# Patient Record
Sex: Female | Born: 1955 | Race: Black or African American | Hispanic: No | Marital: Single | Smoking: Never smoker
Health system: Southern US, Community
[De-identification: ages and names within clinical notes are randomized; demographics above are authoritative.]

## PROBLEM LIST (undated history)

## (undated) DIAGNOSIS — I10 Essential (primary) hypertension: Secondary | ICD-10-CM

## (undated) DIAGNOSIS — F29 Unspecified psychosis not due to a substance or known physiological condition: Secondary | ICD-10-CM

## (undated) DIAGNOSIS — F32A Depression, unspecified: Secondary | ICD-10-CM

## (undated) DIAGNOSIS — E785 Hyperlipidemia, unspecified: Secondary | ICD-10-CM

## (undated) DIAGNOSIS — G8929 Other chronic pain: Secondary | ICD-10-CM

## (undated) DIAGNOSIS — F329 Major depressive disorder, single episode, unspecified: Secondary | ICD-10-CM

## (undated) DIAGNOSIS — E079 Disorder of thyroid, unspecified: Secondary | ICD-10-CM

## (undated) DIAGNOSIS — B1920 Unspecified viral hepatitis C without hepatic coma: Secondary | ICD-10-CM

## (undated) DIAGNOSIS — G1221 Amyotrophic lateral sclerosis: Secondary | ICD-10-CM

## (undated) DIAGNOSIS — I509 Heart failure, unspecified: Secondary | ICD-10-CM

## (undated) DIAGNOSIS — F419 Anxiety disorder, unspecified: Secondary | ICD-10-CM

## (undated) DIAGNOSIS — E559 Vitamin D deficiency, unspecified: Secondary | ICD-10-CM

## (undated) DIAGNOSIS — K219 Gastro-esophageal reflux disease without esophagitis: Secondary | ICD-10-CM

## (undated) DIAGNOSIS — I739 Peripheral vascular disease, unspecified: Secondary | ICD-10-CM

## (undated) DIAGNOSIS — E119 Type 2 diabetes mellitus without complications: Secondary | ICD-10-CM

## (undated) DIAGNOSIS — R270 Ataxia, unspecified: Secondary | ICD-10-CM

## (undated) HISTORY — DX: Type 2 diabetes mellitus without complications: E11.9

## (undated) HISTORY — PX: SHOULDER SURGERY: SHX246

## (undated) HISTORY — DX: Vitamin D deficiency, unspecified: E55.9

## (undated) HISTORY — DX: Peripheral vascular disease, unspecified: I73.9

## (undated) HISTORY — DX: Heart failure, unspecified: I50.9

## (undated) HISTORY — DX: Gastro-esophageal reflux disease without esophagitis: K21.9

## (undated) HISTORY — PX: COLONOSCOPY: SHX174

---

## 2005-09-05 ENCOUNTER — Ambulatory Visit: Payer: Self-pay

## 2006-11-04 ENCOUNTER — Ambulatory Visit: Payer: Self-pay

## 2007-11-26 ENCOUNTER — Ambulatory Visit: Payer: Self-pay

## 2008-10-26 ENCOUNTER — Emergency Department: Payer: Self-pay | Admitting: Emergency Medicine

## 2008-11-28 ENCOUNTER — Ambulatory Visit: Payer: Self-pay

## 2009-11-30 ENCOUNTER — Ambulatory Visit: Payer: Self-pay

## 2010-09-04 ENCOUNTER — Emergency Department: Payer: Self-pay | Admitting: Emergency Medicine

## 2010-10-03 IMAGING — CT CT HEAD WITHOUT CONTRAST
2 series · 16 of 30 positions shown, 20 images · non-contrast
Comparison: none

REASON FOR EXAM: hematoma midline occipital
COMMENTS:

PROCEDURE:     CT  - CT HEAD WITHOUT CONTRAST  - October 26, 2008  [DATE]
RESULT:
HISTORY: Scalp hematoma.
COMPARISON STUDIES:   No recent.

[Series 2: without · axial · non-contrast · 0.41mm/px · z∈[-158,-34]mm · 13 of 31 slices shown, 17 images]
[im 3/31  brain]
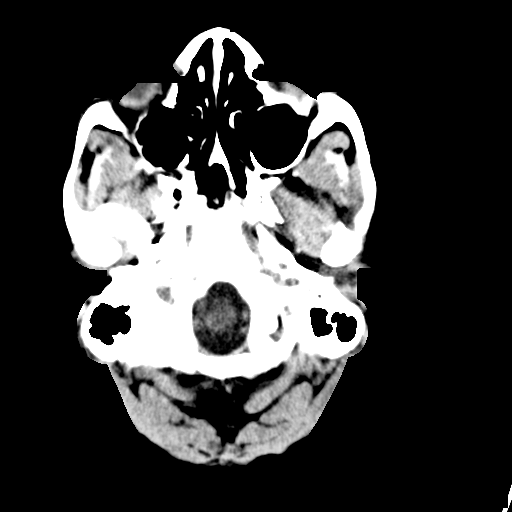
[im 3/31  bone]
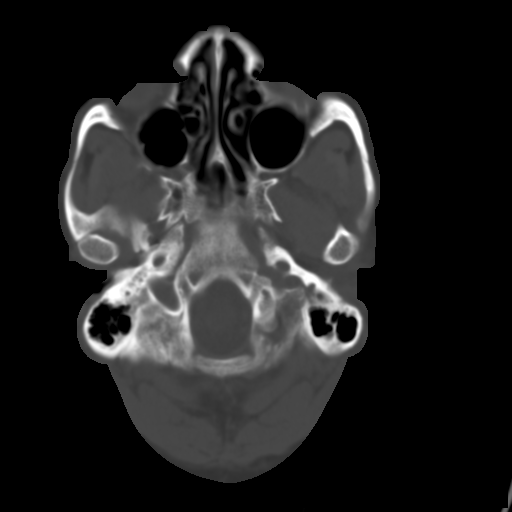
[im 5/31  brain]
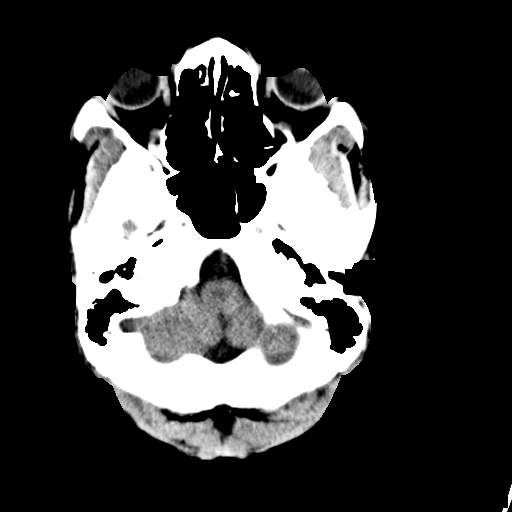
[im 7/31  brain]
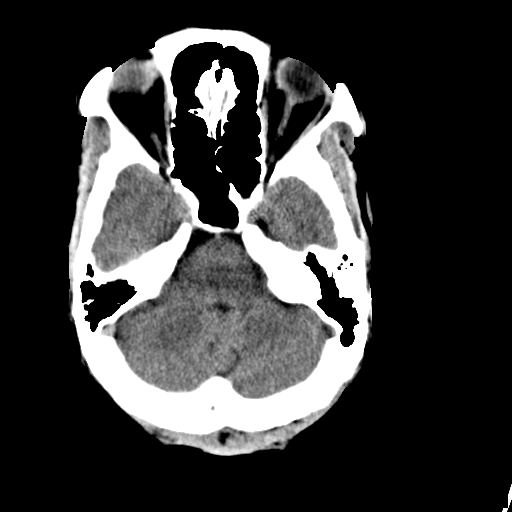
[im 9/31  brain]
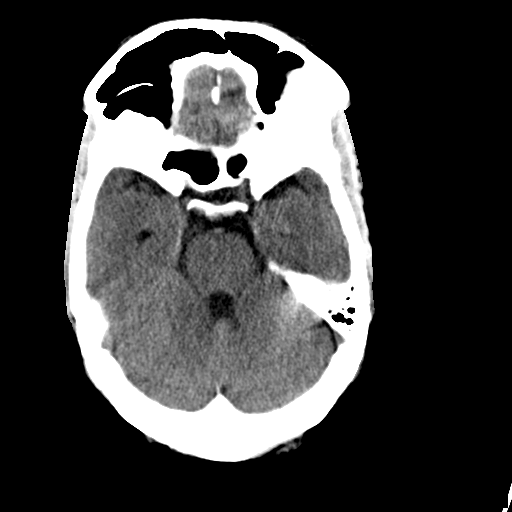
[im 11/31  brain]
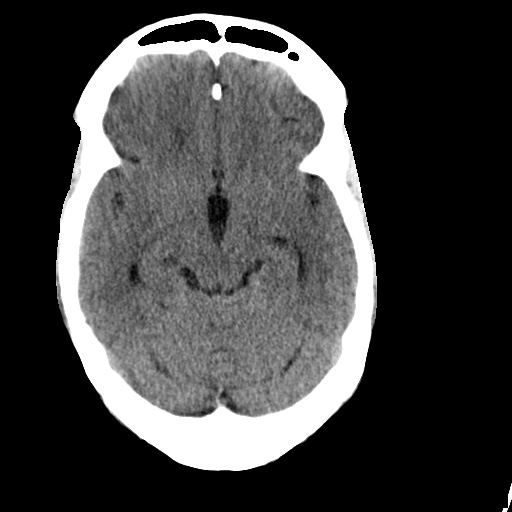
[im 11/31  bone]
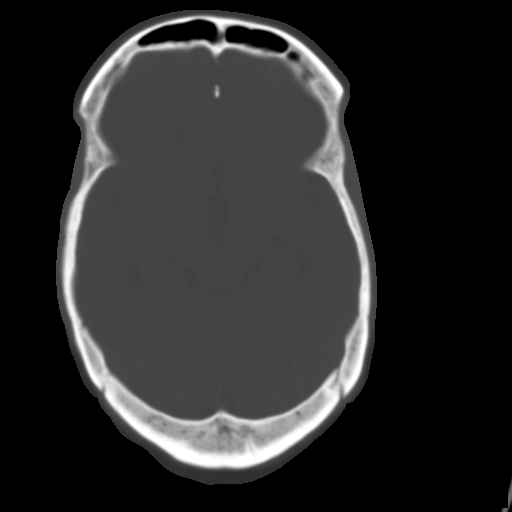
[im 13/31  brain]
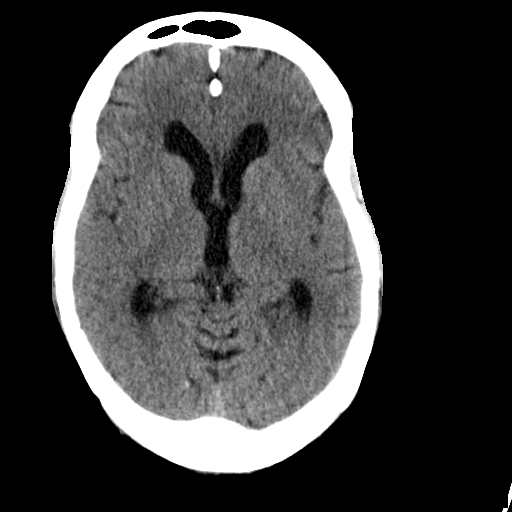
[im 16/31  brain]
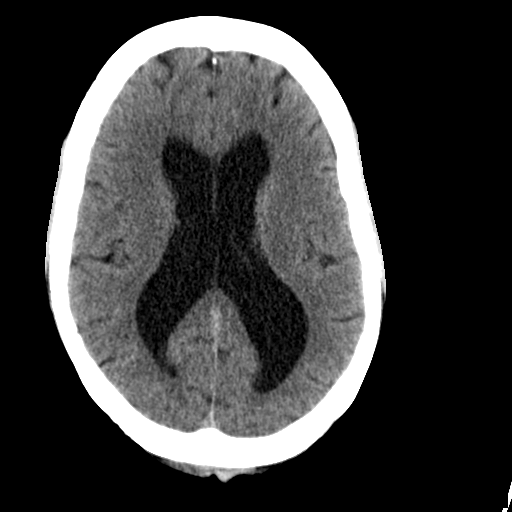
[im 18/31  brain]
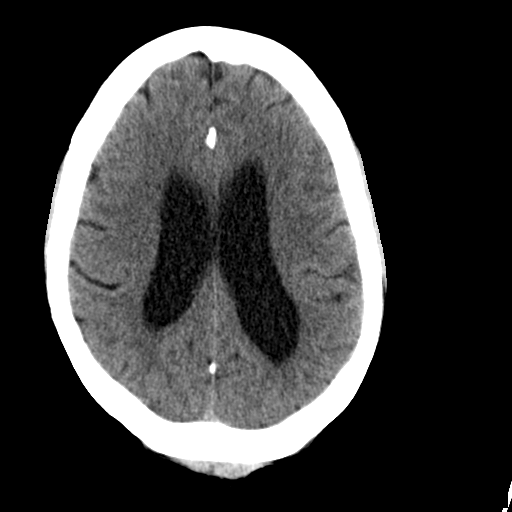
[im 20/31  brain]
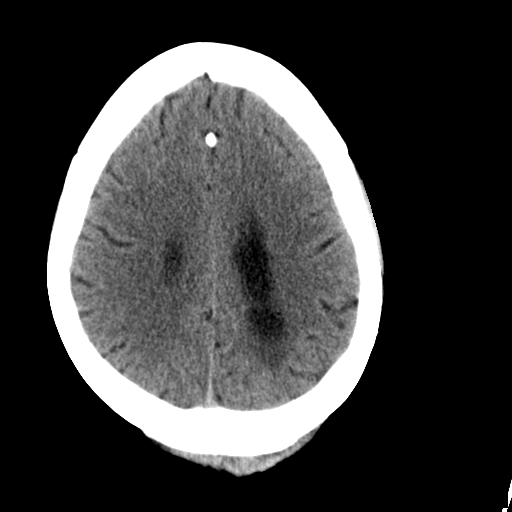
[im 20/31  bone]
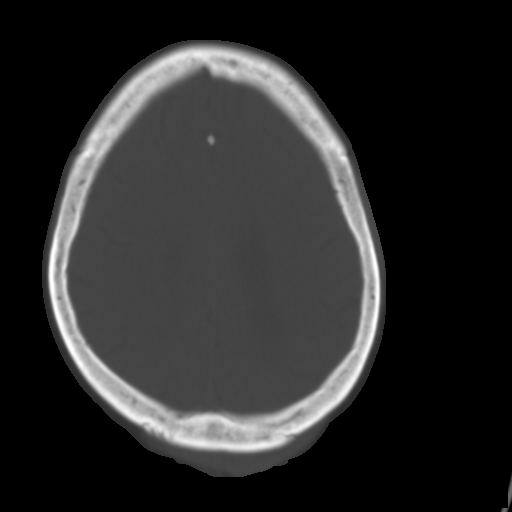
[im 22/31  brain]
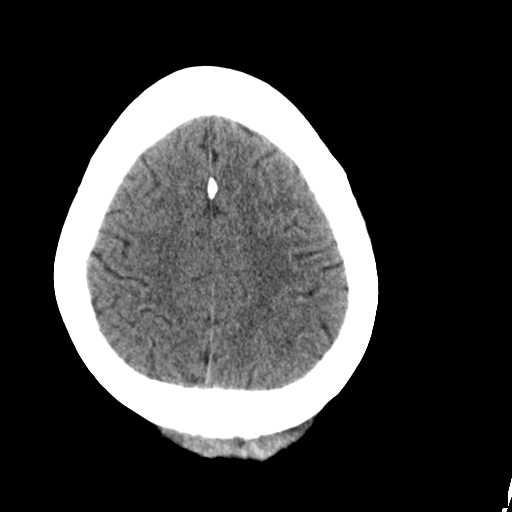
[im 24/31  brain]
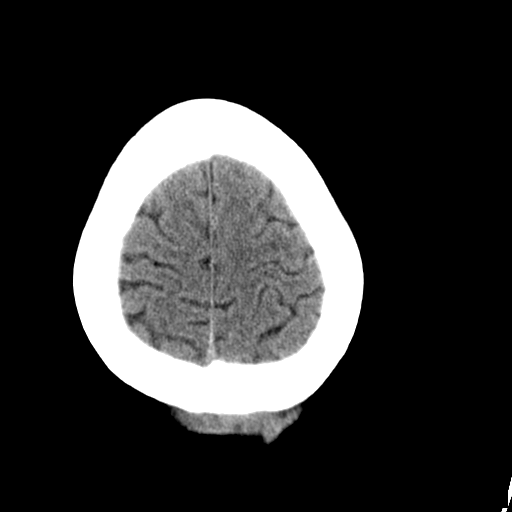
[im 26/31  brain]
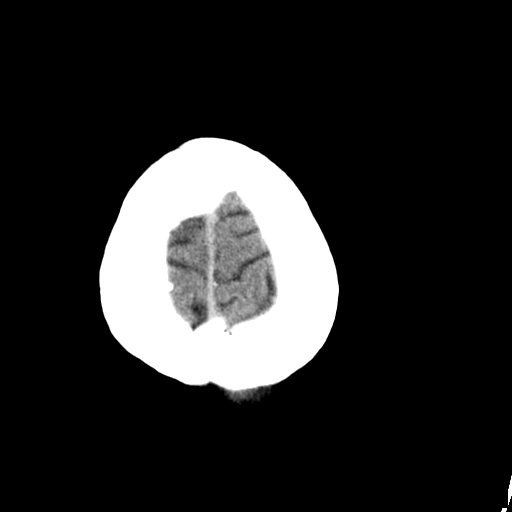
[im 28/31  brain]
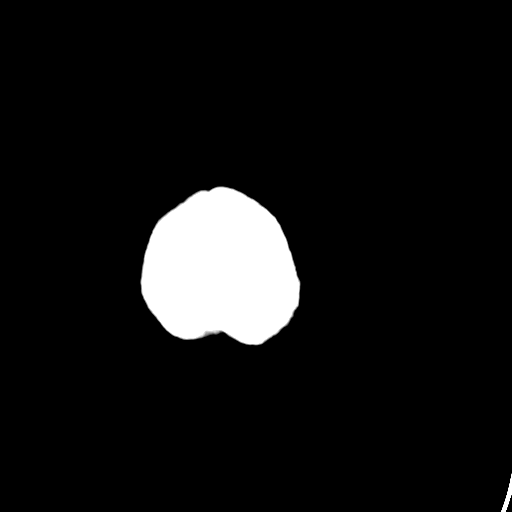
[im 28/31  bone]
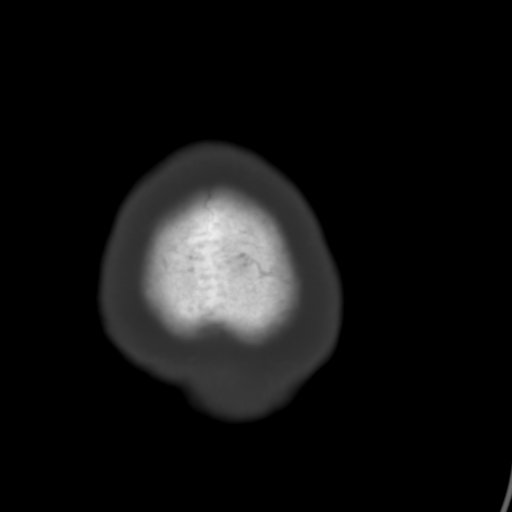

[Series 3: bone · axial · 0.41mm/px · z∈[-158,-118]mm · 3 of 31 slices shown]
[im 3/31  bone]
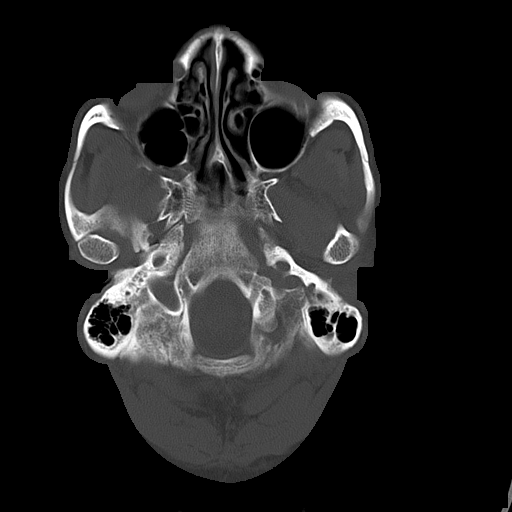
[im 7/31  bone]
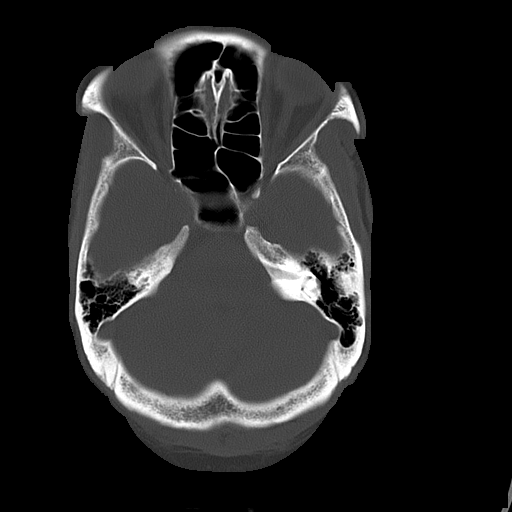
[im 11/31  bone]
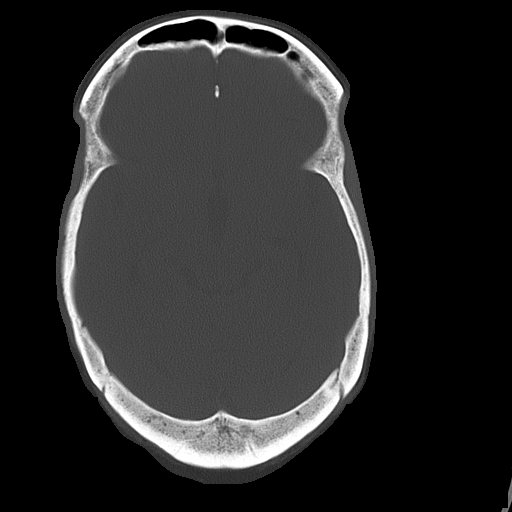

[16 of 30 positions shown; findings below may reference images not displayed]

FINDINGS: Soft tissue swelling is noted over the LEFT occipital region.  No
intraaxial or extraaxial pathologic fluid collections are identified. No
mass lesion is noted. There is no hydrocephalus. No bony abnormality is
identified.
IMPRESSION: 1.     Scalp hematoma LEFT occipital region.
2.     No intraaxial or extraaxial pathologic fluid or blood collection is
identified.
3.     Incidental note is made of mild periventricular changes suggesting
chronic white matter disease.

This report was phoned to the Emergency Room physician at the time of the
study.

## 2010-10-03 IMAGING — CR RIGHT ANKLE - COMPLETE 3+ VIEW
1 series · 6 of 6 positions shown · non-contrast
Comparison: none

REASON FOR EXAM: tender to palpation  of medial maleolus
COMMENTS:

PROCEDURE:     DXR - DXR ANKLE RIGHT COMPLETE  - October 26, 2008  [DATE]
RESULT:     Five views were obtained. No fracture, dislocation or other
acute bony abnormality is identified. The ankle mortise is well maintained.
There is a tiny plantar calcaneal spur.

[Series 1: view not recorded · 0.17mm/px · 6 of 6 slices shown]
[im 1/6]
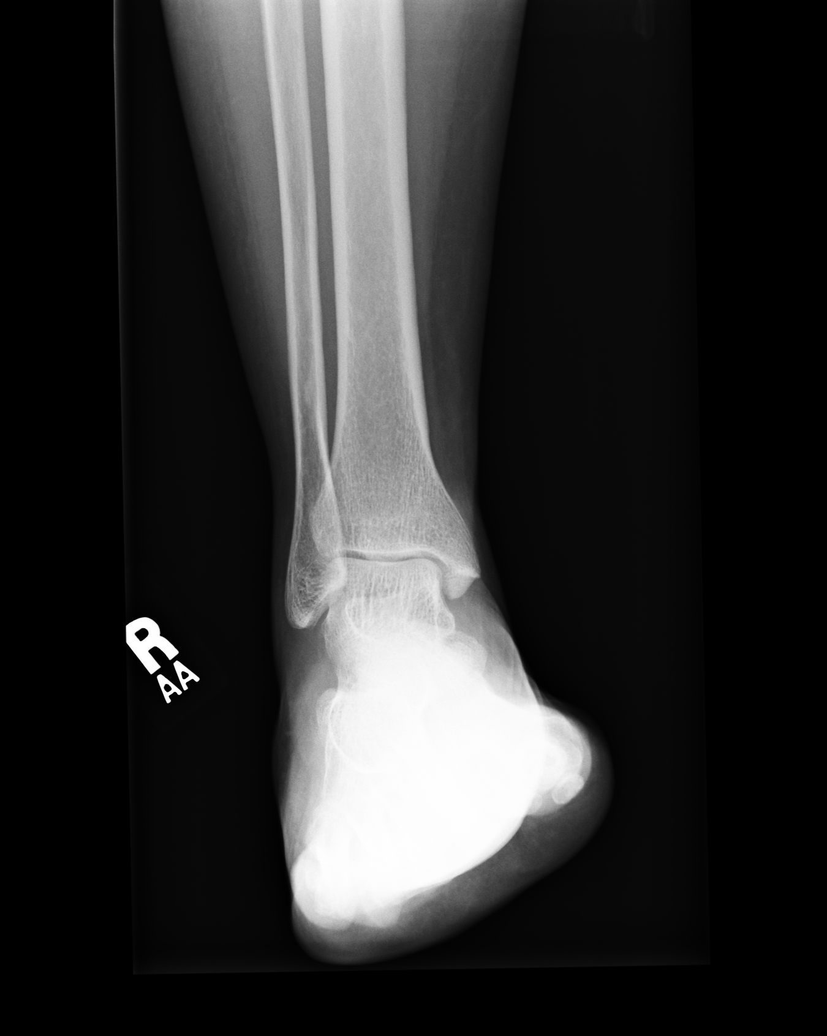
[im 2/6]
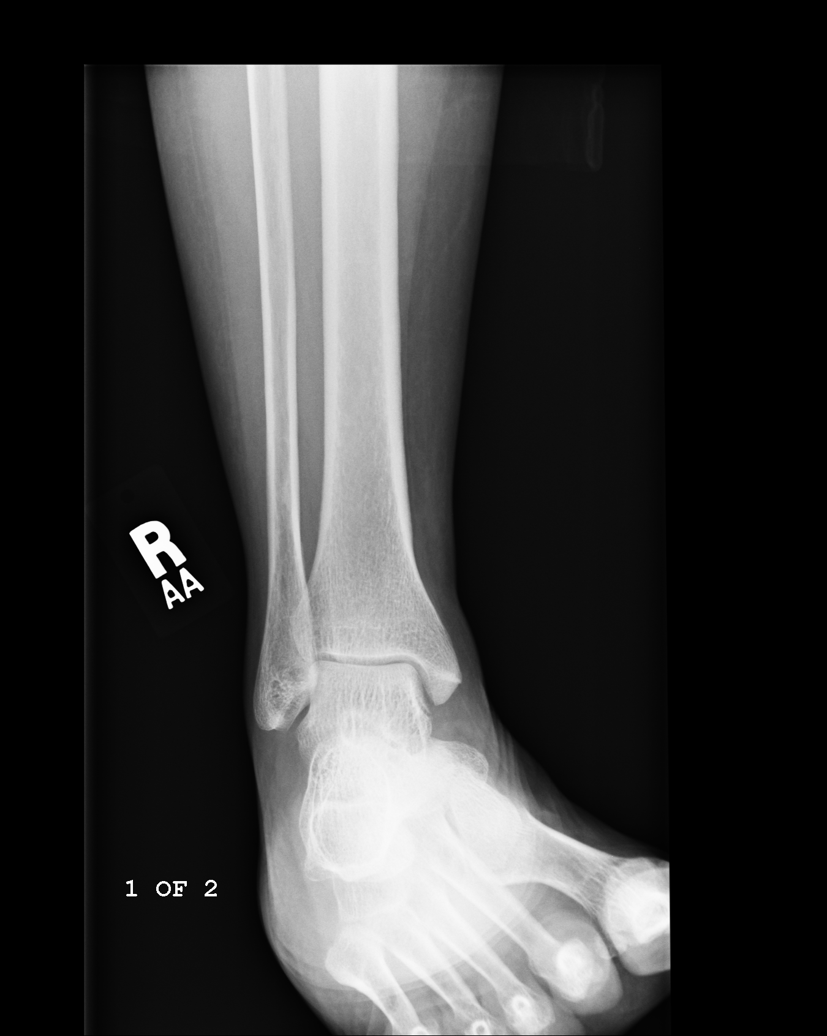
[im 3/6]
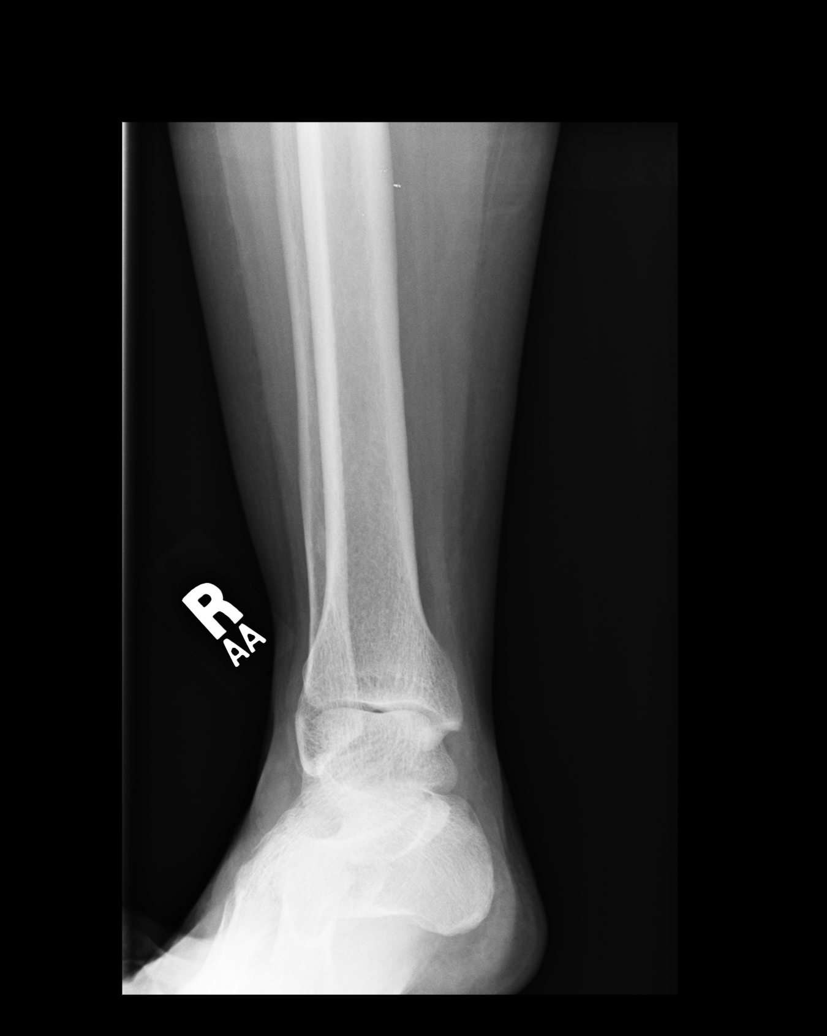
[im 4/6]
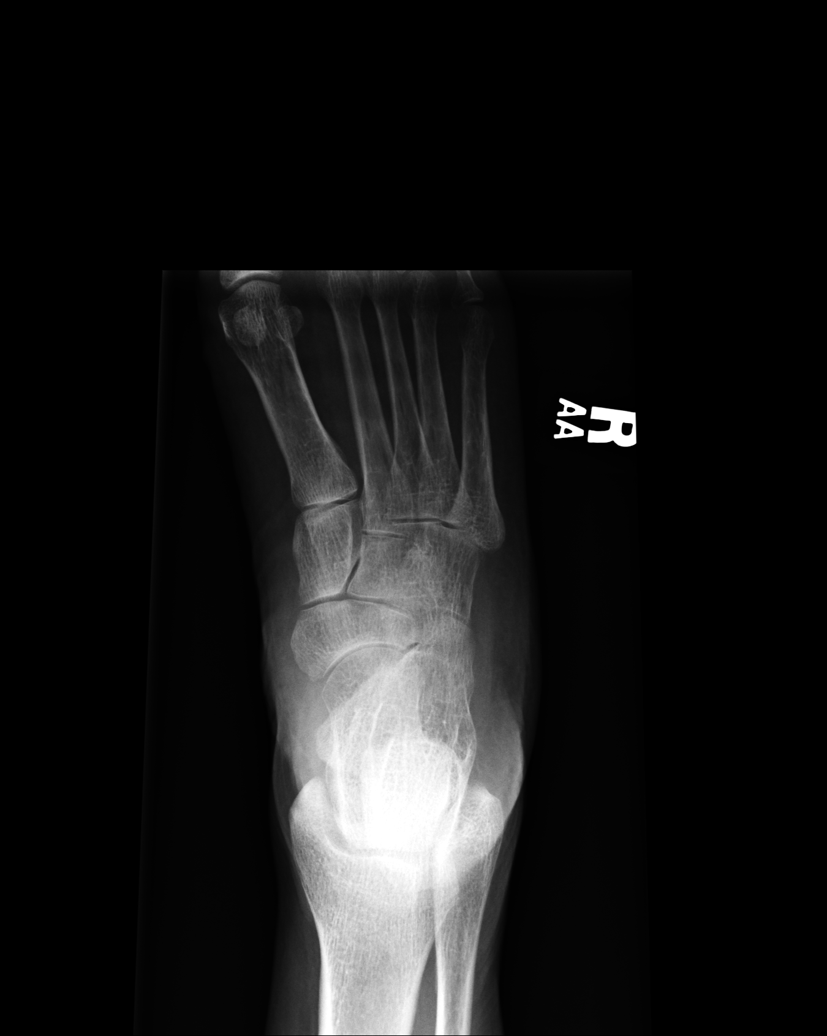
[im 5/6]
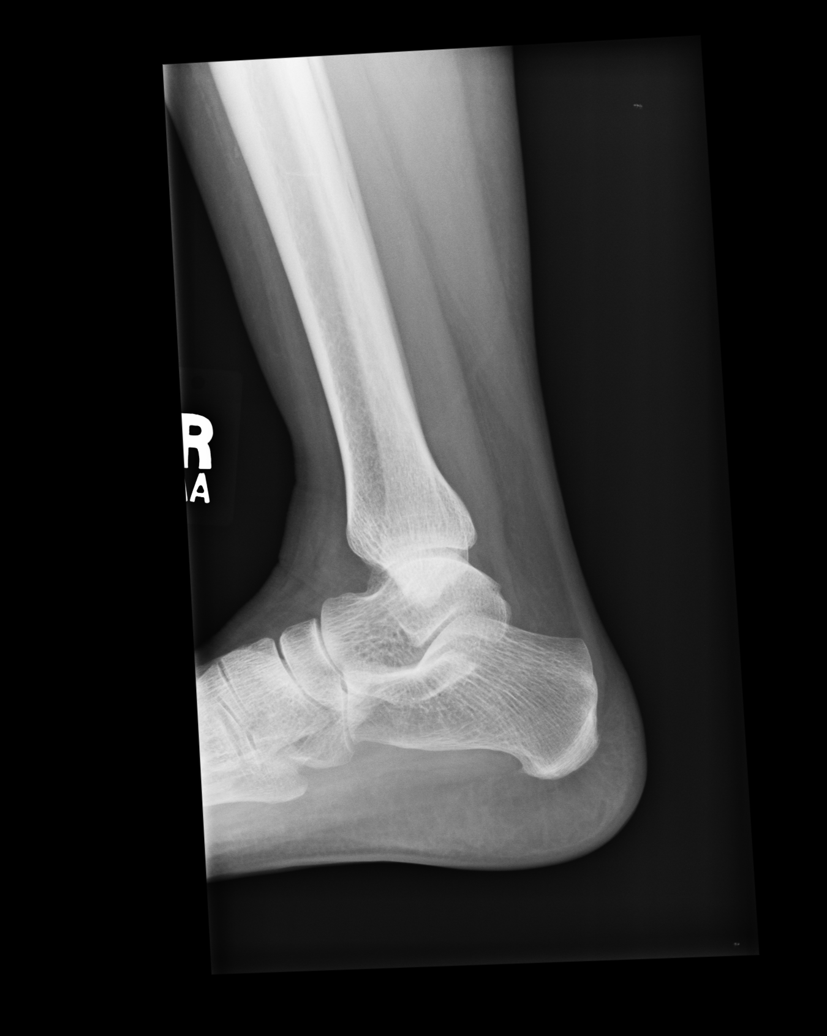
[im 6/6]
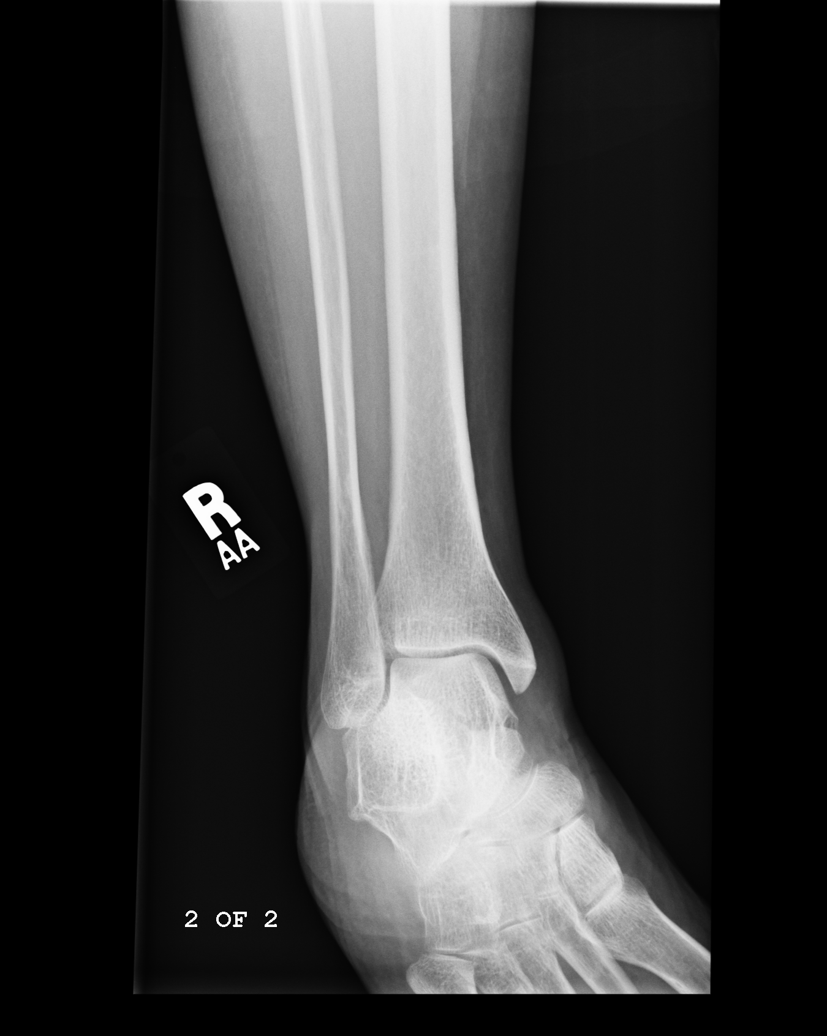

[6 of 6 positions shown; findings below may reference images not displayed]

IMPRESSION: 1. No acute bony abnormalities are identified.
2. There is a tiny plantar calcaneal spur.

## 2010-10-23 DIAGNOSIS — E039 Hypothyroidism, unspecified: Secondary | ICD-10-CM | POA: Diagnosis present

## 2010-12-12 ENCOUNTER — Ambulatory Visit: Payer: Self-pay

## 2011-02-27 DIAGNOSIS — I10 Essential (primary) hypertension: Secondary | ICD-10-CM | POA: Diagnosis present

## 2011-06-07 DIAGNOSIS — B182 Chronic viral hepatitis C: Secondary | ICD-10-CM | POA: Diagnosis present

## 2013-01-12 ENCOUNTER — Emergency Department: Payer: Self-pay | Admitting: Emergency Medicine

## 2013-01-12 LAB — COMPREHENSIVE METABOLIC PANEL
Alkaline Phosphatase: 84 U/L (ref 50–136)
Anion Gap: 13 (ref 7–16)
BUN: 5 mg/dL — ABNORMAL LOW (ref 7–18)
Bilirubin,Total: 1.1 mg/dL — ABNORMAL HIGH (ref 0.2–1.0)
Calcium, Total: 9.4 mg/dL (ref 8.5–10.1)
Co2: 27 mmol/L (ref 21–32)
Creatinine: 0.7 mg/dL (ref 0.60–1.30)
Glucose: 117 mg/dL — ABNORMAL HIGH (ref 65–99)
Osmolality: 267 (ref 275–301)
Sodium: 134 mmol/L — ABNORMAL LOW (ref 136–145)
Total Protein: 9.7 g/dL — ABNORMAL HIGH (ref 6.4–8.2)

## 2013-01-12 LAB — URINALYSIS, COMPLETE
Bilirubin,UR: NEGATIVE
Glucose,UR: NEGATIVE mg/dL (ref 0–75)
Hyaline Cast: 5
Nitrite: NEGATIVE
Ph: 7 (ref 4.5–8.0)
Protein: NEGATIVE
RBC,UR: 1 /HPF (ref 0–5)
Squamous Epithelial: 4
WBC UR: 2 /HPF (ref 0–5)

## 2013-01-12 LAB — TROPONIN I
Troponin-I: 0.04 ng/mL
Troponin-I: 0.03 ng/mL

## 2013-01-12 LAB — CK TOTAL AND CKMB (NOT AT ARMC)
CK-MB: 2 ng/mL (ref 0.5–3.6)
CK-MB: 1.2 ng/mL (ref 0.5–3.6)

## 2013-01-12 LAB — CBC
HGB: 14.2 g/dL (ref 12.0–16.0)
MCHC: 33 g/dL (ref 32.0–36.0)
MCV: 84 fL (ref 80–100)
RBC: 5.11 10*6/uL (ref 3.80–5.20)
RDW: 13.9 % (ref 11.5–14.5)
WBC: 7.9 10*3/uL (ref 3.6–11.0)

## 2013-01-12 LAB — TSH: Thyroid Stimulating Horm: 0.538 u[IU]/mL

## 2013-02-25 DIAGNOSIS — G1221 Amyotrophic lateral sclerosis: Secondary | ICD-10-CM | POA: Diagnosis present

## 2013-12-06 DIAGNOSIS — F32A Depression, unspecified: Secondary | ICD-10-CM | POA: Insufficient documentation

## 2015-11-11 ENCOUNTER — Inpatient Hospital Stay
Admission: EM | Admit: 2015-11-11 | Discharge: 2015-11-14 | DRG: 640 | Disposition: A | Discharge status: Skilled Nursing Facility | Payer: Medicare Other | Attending: Internal Medicine | Admitting: Internal Medicine

## 2015-11-11 ENCOUNTER — Emergency Department: Payer: Medicare Other

## 2015-11-11 ENCOUNTER — Encounter: Payer: Self-pay | Admitting: Emergency Medicine

## 2015-11-11 DIAGNOSIS — F329 Major depressive disorder, single episode, unspecified: Secondary | ICD-10-CM | POA: Diagnosis present

## 2015-11-11 DIAGNOSIS — Z7401 Bed confinement status: Secondary | ICD-10-CM | POA: Diagnosis not present

## 2015-11-11 DIAGNOSIS — T502X5A Adverse effect of carbonic-anhydrase inhibitors, benzothiadiazides and other diuretics, initial encounter: Secondary | ICD-10-CM | POA: Diagnosis present

## 2015-11-11 DIAGNOSIS — Z833 Family history of diabetes mellitus: Secondary | ICD-10-CM

## 2015-11-11 DIAGNOSIS — E860 Dehydration: Secondary | ICD-10-CM | POA: Diagnosis present

## 2015-11-11 DIAGNOSIS — E872 Acidosis, unspecified: Secondary | ICD-10-CM | POA: Diagnosis present

## 2015-11-11 DIAGNOSIS — G1221 Amyotrophic lateral sclerosis: Secondary | ICD-10-CM | POA: Diagnosis present

## 2015-11-11 DIAGNOSIS — E876 Hypokalemia: Secondary | ICD-10-CM | POA: Diagnosis present

## 2015-11-11 DIAGNOSIS — G9341 Metabolic encephalopathy: Secondary | ICD-10-CM | POA: Diagnosis present

## 2015-11-11 DIAGNOSIS — E878 Other disorders of electrolyte and fluid balance, not elsewhere classified: Secondary | ICD-10-CM | POA: Diagnosis present

## 2015-11-11 DIAGNOSIS — E874 Mixed disorder of acid-base balance: Principal | ICD-10-CM | POA: Diagnosis present

## 2015-11-11 DIAGNOSIS — I10 Essential (primary) hypertension: Secondary | ICD-10-CM | POA: Diagnosis present

## 2015-11-11 DIAGNOSIS — B1920 Unspecified viral hepatitis C without hepatic coma: Secondary | ICD-10-CM | POA: Diagnosis present

## 2015-11-11 DIAGNOSIS — T43215A Adverse effect of selective serotonin and norepinephrine reuptake inhibitors, initial encounter: Secondary | ICD-10-CM | POA: Diagnosis present

## 2015-11-11 DIAGNOSIS — A419 Sepsis, unspecified organism: Secondary | ICD-10-CM

## 2015-11-11 DIAGNOSIS — Z8249 Family history of ischemic heart disease and other diseases of the circulatory system: Secondary | ICD-10-CM | POA: Diagnosis not present

## 2015-11-11 DIAGNOSIS — Z79899 Other long term (current) drug therapy: Secondary | ICD-10-CM

## 2015-11-11 DIAGNOSIS — F419 Anxiety disorder, unspecified: Secondary | ICD-10-CM | POA: Diagnosis present

## 2015-11-11 DIAGNOSIS — Y92129 Unspecified place in nursing home as the place of occurrence of the external cause: Secondary | ICD-10-CM | POA: Diagnosis not present

## 2015-11-11 DIAGNOSIS — E039 Hypothyroidism, unspecified: Secondary | ICD-10-CM | POA: Diagnosis present

## 2015-11-11 DIAGNOSIS — Z823 Family history of stroke: Secondary | ICD-10-CM

## 2015-11-11 DIAGNOSIS — E871 Hypo-osmolality and hyponatremia: Secondary | ICD-10-CM | POA: Diagnosis present

## 2015-11-11 DIAGNOSIS — T501X5A Adverse effect of loop [high-ceiling] diuretics, initial encounter: Secondary | ICD-10-CM | POA: Diagnosis present

## 2015-11-11 DIAGNOSIS — R4182 Altered mental status, unspecified: Secondary | ICD-10-CM

## 2015-11-11 DIAGNOSIS — Z79891 Long term (current) use of opiate analgesic: Secondary | ICD-10-CM | POA: Diagnosis not present

## 2015-11-11 HISTORY — DX: Major depressive disorder, single episode, unspecified: F32.9

## 2015-11-11 HISTORY — DX: Essential (primary) hypertension: I10

## 2015-11-11 HISTORY — DX: Disorder of thyroid, unspecified: E07.9

## 2015-11-11 HISTORY — DX: Unspecified viral hepatitis C without hepatic coma: B19.20

## 2015-11-11 HISTORY — DX: Anxiety disorder, unspecified: F41.9

## 2015-11-11 HISTORY — DX: Amyotrophic lateral sclerosis: G12.21

## 2015-11-11 HISTORY — DX: Depression, unspecified: F32.A

## 2015-11-11 LAB — URINALYSIS COMPLETE WITH MICROSCOPIC (ARMC ONLY)
BACTERIA UA: NONE SEEN
Bilirubin Urine: NEGATIVE
Glucose, UA: NEGATIVE mg/dL
Ketones, ur: NEGATIVE mg/dL
Leukocytes, UA: NEGATIVE
Nitrite: NEGATIVE
PROTEIN: NEGATIVE mg/dL
Specific Gravity, Urine: 1.009 (ref 1.005–1.030)
pH: 6 (ref 5.0–8.0)

## 2015-11-11 LAB — CBC WITH DIFFERENTIAL/PLATELET
Basophils Absolute: 0.1 10*3/uL (ref 0–0.1)
Basophils Relative: 1 %
EOS ABS: 0.1 10*3/uL (ref 0–0.7)
Eosinophils Relative: 1 %
HEMATOCRIT: 42.6 % (ref 35.0–47.0)
HEMOGLOBIN: 14.1 g/dL (ref 12.0–16.0)
LYMPHS ABS: 2.3 10*3/uL (ref 1.0–3.6)
Lymphocytes Relative: 12 %
MCH: 26.9 pg (ref 26.0–34.0)
MCHC: 33.1 g/dL (ref 32.0–36.0)
MCV: 81.1 fL (ref 80.0–100.0)
Monocytes Absolute: 1.4 10*3/uL — ABNORMAL HIGH (ref 0.2–0.9)
Neutro Abs: 15.7 10*3/uL — ABNORMAL HIGH (ref 1.4–6.5)
Platelets: 233 10*3/uL (ref 150–440)
RBC: 5.26 MIL/uL — ABNORMAL HIGH (ref 3.80–5.20)
RDW: 13.4 % (ref 11.5–14.5)
WBC: 19.7 10*3/uL — ABNORMAL HIGH (ref 3.6–11.0)

## 2015-11-11 LAB — COMPREHENSIVE METABOLIC PANEL
ALBUMIN: 3.9 g/dL (ref 3.5–5.0)
ALT: 93 U/L — AB (ref 14–54)
AST: 104 U/L — AB (ref 15–41)
Alkaline Phosphatase: 100 U/L (ref 38–126)
BUN: 30 mg/dL — AB (ref 6–20)
CO2: 47 mmol/L — AB (ref 22–32)
CREATININE: 0.57 mg/dL (ref 0.44–1.00)
Calcium: 9.2 mg/dL (ref 8.9–10.3)
GFR calc Af Amer: >60 mL/min (ref >60)
GFR calc non Af Amer: >60 mL/min (ref >60)
Glucose, Bld: 144 mg/dL — ABNORMAL HIGH (ref 65–99)
POTASSIUM: 2.3 mmol/L — AB (ref 3.5–5.1)
SODIUM: 124 mmol/L — AB (ref 135–145)
Total Bilirubin: 1.4 mg/dL — ABNORMAL HIGH (ref 0.3–1.2)
Total Protein: 9.4 g/dL — ABNORMAL HIGH (ref 6.5–8.1)

## 2015-11-11 LAB — LIPASE, BLOOD: Lipase: 30 U/L (ref 11–51)

## 2015-11-11 LAB — BLOOD GAS, ARTERIAL
ACID-BASE EXCESS: 39.2 mmol/L — AB (ref 0.0–3.0)
ALLENS TEST (PASS/FAIL): POSITIVE — AB
BICARBONATE: 67.3 meq/L — AB (ref 21.0–28.0)
FIO2: 0.28
O2 Saturation: 96.8 %
PATIENT TEMPERATURE: 37
PH ART: 7.61 — AB (ref 7.350–7.450)
pCO2 arterial: 67 mmHg — ABNORMAL HIGH (ref 32.0–48.0)
pO2, Arterial: 74 mmHg — ABNORMAL LOW (ref 83.0–108.0)

## 2015-11-11 LAB — MAGNESIUM: Magnesium: 2.3 mg/dL (ref 1.7–2.4)

## 2015-11-11 LAB — SALICYLATE LEVEL: Salicylate Lvl: <4 mg/dL (ref 2.8–30.0)

## 2015-11-11 LAB — TSH: TSH: 6.345 u[IU]/mL — ABNORMAL HIGH (ref 0.350–4.500)

## 2015-11-11 LAB — LACTIC ACID, PLASMA: Lactic Acid, Venous: 1.8 mmol/L (ref 0.5–2.0)

## 2015-11-11 MED ORDER — GABAPENTIN 300 MG PO CAPS
600.0000 mg | ORAL_CAPSULE | Freq: Two times a day (BID) | ORAL | Status: DC
  Administered 2015-11-11 – 2015-11-14 (×6): 600 mg via ORAL
  Filled 2015-11-11 (×2): qty 2
  Filled 2015-11-11: qty 1
  Filled 2015-11-11 (×4): qty 2

## 2015-11-11 MED ORDER — BISACODYL 10 MG RE SUPP: 10.0000 mg | Freq: Every day | RECTAL | Status: DC | PRN

## 2015-11-11 MED ORDER — MAGNESIUM SULFATE 2 GM/50ML IV SOLN
2.0000 g | Freq: Once | INTRAVENOUS | Status: AC
  Administered 2015-11-11: 2 g via INTRAVENOUS
  Filled 2015-11-11: qty 50

## 2015-11-11 MED ORDER — FUROSEMIDE 40 MG PO TABS: 80.0000 mg | ORAL_TABLET | Freq: Two times a day (BID) | ORAL | Status: DC

## 2015-11-11 MED ORDER — MORPHINE SULFATE (PF) 2 MG/ML IV SOLN: 2.0000 mg | INTRAVENOUS | Status: DC | PRN

## 2015-11-11 MED ORDER — SODIUM CHLORIDE 0.9 % IV BOLUS (SEPSIS)
1000.0000 mL | Freq: Once | INTRAVENOUS | Status: AC
  Administered 2015-11-11: 1000 mL via INTRAVENOUS

## 2015-11-11 MED ORDER — IOHEXOL 300 MG/ML  SOLN
100.0000 mL | Freq: Once | INTRAMUSCULAR | Status: AC | PRN
  Administered 2015-11-11: 100 mL via INTRAVENOUS

## 2015-11-11 MED ORDER — QUETIAPINE FUMARATE 25 MG PO TABS
25.0000 mg | ORAL_TABLET | Freq: Two times a day (BID) | ORAL | Status: DC
  Administered 2015-11-11 – 2015-11-14 (×6): 25 mg via ORAL
  Filled 2015-11-11 (×6): qty 1

## 2015-11-11 MED ORDER — ETOMIDATE 2 MG/ML IV SOLN
INTRAVENOUS | Status: AC | PRN
  Administered 2015-11-11: 20 mg via INTRAVENOUS

## 2015-11-11 MED ORDER — SODIUM CHLORIDE 0.9 % IJ SOLN: 3.0000 mL | Freq: Two times a day (BID) | INTRAMUSCULAR | Status: DC

## 2015-11-11 MED ORDER — IOHEXOL 240 MG/ML SOLN
25.0000 mL | Freq: Once | INTRAMUSCULAR | Status: AC | PRN
  Administered 2015-11-11: 25 mL via ORAL

## 2015-11-11 MED ORDER — CETYLPYRIDINIUM CHLORIDE 0.05 % MT LIQD
7.0000 mL | Freq: Two times a day (BID) | OROMUCOSAL | Status: DC
  Administered 2015-11-11 – 2015-11-14 (×5): 7 mL via OROMUCOSAL

## 2015-11-11 MED ORDER — VITAMIN D (ERGOCALCIFEROL) 1.25 MG (50000 UNIT) PO CAPS
50000.0000 [IU] | ORAL_CAPSULE | ORAL | Status: DC
  Filled 2015-11-11: qty 1

## 2015-11-11 MED ORDER — SUCCINYLCHOLINE CHLORIDE 20 MG/ML IJ SOLN
INTRAMUSCULAR | Status: AC | PRN
  Administered 2015-11-11: 100 mg via INTRAVENOUS

## 2015-11-11 MED ORDER — PIPERACILLIN-TAZOBACTAM 3.375 G IVPB
3.3750 g | Freq: Once | INTRAVENOUS | Status: AC
  Administered 2015-11-11: 3.375 g via INTRAVENOUS
  Filled 2015-11-11: qty 50

## 2015-11-11 MED ORDER — DULOXETINE HCL 60 MG PO CPEP
90.0000 mg | ORAL_CAPSULE | ORAL | Status: DC
  Administered 2015-11-12 – 2015-11-14 (×3): 90 mg via ORAL
  Filled 2015-11-11 (×4): qty 1

## 2015-11-11 MED ORDER — LEVOTHYROXINE SODIUM 75 MCG PO TABS
75.0000 ug | ORAL_TABLET | Freq: Every day | ORAL | Status: DC
  Administered 2015-11-12 – 2015-11-14 (×3): 75 ug via ORAL
  Filled 2015-11-11 (×3): qty 1

## 2015-11-11 MED ORDER — CALCIUM CARBONATE ANTACID 500 MG PO CHEW
500.0000 mg | CHEWABLE_TABLET | Freq: Every day | ORAL | Status: DC
  Administered 2015-11-12 – 2015-11-14 (×3): 500 mg via ORAL
  Filled 2015-11-11 (×3): qty 1

## 2015-11-11 MED ORDER — DOCUSATE SODIUM 100 MG PO CAPS
100.0000 mg | ORAL_CAPSULE | Freq: Two times a day (BID) | ORAL | Status: DC
  Administered 2015-11-11 – 2015-11-14 (×6): 100 mg via ORAL
  Filled 2015-11-11 (×6): qty 1

## 2015-11-11 MED ORDER — SODIUM CHLORIDE 0.9 % IV SOLN
INTRAVENOUS | Status: DC
  Administered 2015-11-11 – 2015-11-14 (×6): via INTRAVENOUS

## 2015-11-11 MED ORDER — ONDANSETRON HCL 4 MG PO TABS: 4.0000 mg | ORAL_TABLET | Freq: Four times a day (QID) | ORAL | Status: DC | PRN

## 2015-11-11 MED ORDER — FENTANYL CITRATE (PF) 100 MCG/2ML IJ SOLN
50.0000 ug | INTRAMUSCULAR | Status: DC | PRN
  Administered 2015-11-11: 50 ug via INTRAVENOUS
  Filled 2015-11-11: qty 2

## 2015-11-11 MED ORDER — POTASSIUM CHLORIDE CRYS ER 20 MEQ PO TBCR
20.0000 meq | EXTENDED_RELEASE_TABLET | Freq: Two times a day (BID) | ORAL | Status: DC
  Administered 2015-11-11 – 2015-11-12 (×3): 20 meq via ORAL
  Filled 2015-11-11 (×3): qty 1

## 2015-11-11 MED ORDER — HEPARIN SODIUM (PORCINE) 5000 UNIT/ML IJ SOLN
5000.0000 [IU] | Freq: Three times a day (TID) | INTRAMUSCULAR | Status: DC
  Administered 2015-11-11 – 2015-11-14 (×8): 5000 [IU] via SUBCUTANEOUS
  Filled 2015-11-11 (×8): qty 1

## 2015-11-11 MED ORDER — SODIUM BICARBONATE 8.4 % IV SOLN
50.0000 meq | Freq: Once | INTRAVENOUS | Status: AC
  Administered 2015-11-11: 50 meq via INTRAVENOUS
  Filled 2015-11-11: qty 50

## 2015-11-11 MED ORDER — POTASSIUM PHOSPHATES 15 MMOLE/5ML IV SOLN
40.0000 meq | Freq: Once | INTRAVENOUS | Status: DC
  Filled 2015-11-11: qty 9.09

## 2015-11-11 MED ORDER — MORPHINE SULFATE ER 15 MG PO TBCR
15.0000 mg | EXTENDED_RELEASE_TABLET | Freq: Three times a day (TID) | ORAL | Status: DC
  Administered 2015-11-11 – 2015-11-13 (×5): 15 mg via ORAL
  Filled 2015-11-11 (×5): qty 1

## 2015-11-11 MED ORDER — PANTOPRAZOLE SODIUM 40 MG PO TBEC
40.0000 mg | DELAYED_RELEASE_TABLET | Freq: Every day | ORAL | Status: DC
  Administered 2015-11-12 – 2015-11-14 (×3): 40 mg via ORAL
  Filled 2015-11-11 (×3): qty 1

## 2015-11-11 MED ORDER — ACETAMINOPHEN 650 MG RE SUPP: 650.0000 mg | Freq: Four times a day (QID) | RECTAL | Status: DC | PRN

## 2015-11-11 MED ORDER — PROMETHAZINE HCL 25 MG/ML IJ SOLN: 25.0000 mg | Freq: Four times a day (QID) | INTRAMUSCULAR | Status: DC | PRN

## 2015-11-11 MED ORDER — FUROSEMIDE 40 MG PO TABS
80.0000 mg | ORAL_TABLET | Freq: Every day | ORAL | Status: DC
  Administered 2015-11-12 – 2015-11-14 (×3): 80 mg via ORAL
  Filled 2015-11-11 (×3): qty 2

## 2015-11-11 MED ORDER — POTASSIUM CHLORIDE 10 MEQ/100ML IV SOLN
10.0000 meq | INTRAVENOUS | Status: DC
  Filled 2015-11-11 (×4): qty 100

## 2015-11-11 MED ORDER — ACETAMINOPHEN 325 MG PO TABS: 650.0000 mg | ORAL_TABLET | Freq: Four times a day (QID) | ORAL | Status: DC | PRN

## 2015-11-11 MED ORDER — ACETAMINOPHEN 500 MG PO TABS: 1000.0000 mg | ORAL_TABLET | Freq: Four times a day (QID) | ORAL | Status: DC | PRN

## 2015-11-11 MED ORDER — BISACODYL 5 MG PO TBEC: 10.0000 mg | DELAYED_RELEASE_TABLET | Freq: Every day | ORAL | Status: DC | PRN

## 2015-11-11 MED ORDER — DOCUSATE SODIUM 100 MG PO TABS: 100.0000 mg | ORAL_TABLET | Freq: Two times a day (BID) | ORAL | Status: DC

## 2015-11-11 MED ORDER — ONDANSETRON HCL 4 MG/2ML IJ SOLN: 4.0000 mg | Freq: Four times a day (QID) | INTRAMUSCULAR | Status: DC | PRN

## 2015-11-11 MED ORDER — POTASSIUM CHLORIDE 10 MEQ/100ML IV SOLN
10.0000 meq | INTRAVENOUS | Status: AC
  Administered 2015-11-11 – 2015-11-12 (×4): 10 meq via INTRAVENOUS
  Filled 2015-11-11 (×4): qty 100

## 2015-11-11 NOTE — ED Notes (Signed)
Attempted urinary catheterization, pt very dry. Small amount of urine in tube, but no drainage into  Specimen container.

## 2015-11-11 NOTE — ED Notes (Signed)
Pt arrived via EMS from [facility]. Staff told EMS patient recently started on Trazodone and was given a dose of Morphine during the night as well. Staff unable to arouse patient today and unable to obtain labs. Pt has history of ALS. Pt congested per EMS and does have a cough present. Staff mentioned when patient coughs she has a choking sensation. EMS reports no oral intake today. EMS unsure of patient's baseline communication level. When EMS arrived on scene o2 sats were 80% on RA.  When placed on 3 L patient was up to 96%. Pt is alert and following simple commands. Non-verbal at this time.

## 2015-11-11 NOTE — ED Notes (Signed)
Drained bladder with I&O cath.  600 ml

## 2015-11-11 NOTE — ED Notes (Signed)
Dr Diamond at bedside. 

## 2015-11-11 NOTE — ED Notes (Signed)
IV removed from right ac; appeared to be infiltrating; small area of swelling above insertion site.

## 2015-11-11 NOTE — H&P (Addendum)
Cynthia Landry is an 59 y.o. female.   Chief Complaint: Somnolence HPI: The patient with past medical history significant for ALS presents to the emergency department via EMS from her nursing home due to excessive somnolence. The patient's boyfriend states that she has become more and more lethargic since she was started on trazodone. In the emergency department laboratory evaluation revealed multiple electrolyte abnormalities. The patient's abdomen was distended and she appeared to be in pain which found to the emergency department staff to obtain a CT of her abdomen. No acute process was seen on imaging. An ABG was obtained which showed metabolic alkalosis with compensatory respiratory acidosis. Due to multiple medical problems and lethargy the emergency department staff called for admission.  Past Medical History  Diagnosis Date  . Amyotrophic lateral sclerosis ([place])   . Hepatitis C     Past Surgical History  Procedure Laterality Date  . Shoulder surgery      Family History  Problem Relation Age of Onset  . CAD Father   . Diabetes Mellitus II Mother   . Stroke Mother    Social History:  has no tobacco, alcohol, and drug history on file.  Allergies:  Allergies  Allergen Reactions  . Aspirin Other (See Comments)    dizziness  . Hctz [Hydrochlorothiazide] Other (See Comments)    cramping  . Lisinopril Cough    Prior to Admission medications   Medication Sig Start Date End Date Taking? Authorizing Provider  acetaminophen (TYLENOL) 500 MG tablet Take 1,000 mg by mouth every 6 (six) hours as needed for mild pain or moderate pain. *not to exceed 4 grams/24 hours*   Yes Historical Provider, MD  bisacodyl (DULCOLAX) 10 MG suppository Place 10 mg rectally daily as needed for mild constipation, moderate constipation or severe constipation.   Yes Historical Provider, MD  bisacodyl (DULCOLAX) 5 MG EC tablet Take 10 mg by mouth daily as needed for mild constipation, moderate constipation  or severe constipation.   Yes Historical Provider, MD  Calcium 500 MG tablet Take 500 mg by mouth daily.   Yes Historical Provider, MD  Docusate Sodium 100 MG capsule Take 100 mg by mouth every 12 (twelve) hours as needed for constipation.   Yes Historical Provider, MD  DULoxetine (CYMBALTA) 30 MG capsule Take 90 mg by mouth every morning.   Yes Historical Provider, MD  ergocalciferol (VITAMIN D2) 50000 UNITS capsule Take 50,000 Units by mouth once a week.   Yes Historical Provider, MD  furosemide (LASIX) 80 MG tablet Take 80 mg by mouth 2 (two) times daily.   Yes Historical Provider, MD  gabapentin (NEURONTIN) 600 MG tablet Take 600 mg by mouth 2 (two) times daily.   Yes Historical Provider, MD  levothyroxine (SYNTHROID, LEVOTHROID) 75 MCG tablet Take 75 mcg by mouth daily.   Yes Historical Provider, MD  metolazone (ZAROXOLYN) 2.5 MG tablet Take 2.5 mg by mouth every morning. *to be given 30 minutes before morning dose of lasix*   Yes Historical Provider, MD  morphine (MS CONTIN) 15 MG 12 hr tablet Take 15 mg by mouth 3 (three) times daily.   Yes Historical Provider, MD  omeprazole (PRILOSEC) 20 MG capsule Take 20 mg by mouth 2 (two) times daily before a meal.   Yes Historical Provider, MD  oxyCODONE (OXY IR/ROXICODONE) 5 MG immediate release tablet Take 5-10 mg by mouth every 6 (six) hours as needed for moderate pain or severe pain.   Yes Historical Provider, MD  Polyethylene Glycol 3350 (  MIRALAX PO) Take 17 g by mouth See admin instructions. Give 17 grams orally with 8 oz of water and drink by mouth twice a day. Then use every 12 hours as needed for constipation.   Yes Historical Provider, MD  potassium chloride SA (K-DUR,KLOR-CON) 20 MEQ tablet Take 20 mEq by mouth 2 (two) times daily. Take with food.   Yes Historical Provider, MD  promethazine (PHENERGAN) 25 MG/ML injection Inject 25 mg into the muscle every 6 (six) hours as needed for nausea or vomiting.   Yes Historical Provider, MD  QUEtiapine  (SEROQUEL) 25 MG tablet Take 25 mg by mouth 2 (two) times daily.   Yes Historical Provider, MD  traZODone (DESYREL) 50 MG tablet Take 25 mg by mouth 3 (three) times daily.   Yes Historical Provider, MD     Results for orders placed or performed during the hospital encounter of 11/11/15 (from the past 48 hour(s))  CBC with Differential     Status: Abnormal   Collection Time: 11/11/15 11:50 AM  Result Value Ref Range   WBC 19.7 (H) 3.6 - 11.0 K/uL   RBC 5.26 (H) 3.80 - 5.20 MIL/uL   Hemoglobin 14.1 12.0 - 16.0 g/dL   HCT 42.6 35.0 - 47.0 %   MCV 81.1 80.0 - 100.0 fL   MCH 26.9 26.0 - 34.0 pg   MCHC 33.1 32.0 - 36.0 g/dL   RDW 13.4 11.5 - 14.5 %   Platelets 233 150 - 440 K/uL    Comment: PLATELET COUNT CONFIRMED BY SMEAR   Neutrophils Relative % 79% %   Neutro Abs 15.7 (H) 1.4 - 6.5 K/uL   Lymphocytes Relative 12% %   Lymphs Abs 2.3 1.0 - 3.6 K/uL   Monocytes Relative 7% %   Monocytes Absolute 1.4 (H) 0.2 - 0.9 K/uL   Eosinophils Relative 1% %   Eosinophils Absolute 0.1 0 - 0.7 K/uL   Basophils Relative 1% %   Basophils Absolute 0.1 0 - 0.1 K/uL  Urinalysis complete, with microscopic ([facility] only)     Status: Abnormal   Collection Time: 11/11/15 11:50 AM  Result Value Ref Range   Color, Urine YELLOW (A) YELLOW   APPearance CLEAR (A) CLEAR   Glucose, UA NEGATIVE NEGATIVE mg/dL   Bilirubin Urine NEGATIVE NEGATIVE   Ketones, ur NEGATIVE NEGATIVE mg/dL   Specific Gravity, Urine 1.009 1.005 - 1.030   Hgb urine dipstick 1+ (A) NEGATIVE   pH 6.0 5.0 - 8.0   Protein, ur NEGATIVE NEGATIVE mg/dL   Nitrite NEGATIVE NEGATIVE   Leukocytes, UA NEGATIVE NEGATIVE   RBC / HPF 0-5 0 - 5 RBC/hpf   WBC, UA 0-5 0 - 5 WBC/hpf   Bacteria, UA NONE SEEN NONE SEEN   Squamous Epithelial / LPF 0-5 (A) NONE SEEN   Hyaline Casts, UA PRESENT   Comprehensive metabolic panel     Status: Abnormal   Collection Time: 11/11/15  1:25 PM  Result Value Ref Range   Sodium 124 (L) 135 - 145 mmol/L   Potassium  2.3 (LL) 3.5 - 5.1 mmol/L    Comment: HEMOLYSIS AT THIS LEVEL MAY AFFECT RESULT CRITICAL RESULT CALLED TO, READ BACK BY AND VERIFIED WITH MARY NEEDHAM AT 11/11/15 AT 1419 BY DAS    Chloride <65 (LL) 101 - 111 mmol/L   CO2 47 (H) 22 - 32 mmol/L   Glucose, Bld 144 (H) 65 - 99 mg/dL   BUN 30 (H) 6 - 20 mg/dL   Creatinine, Ser 0.57  0.44 - 1.00 mg/dL   Calcium 9.2 8.9 - 10.3 mg/dL   Total Protein 9.4 (H) 6.5 - 8.1 g/dL   Albumin 3.9 3.5 - 5.0 g/dL   AST 104 (H) 15 - 41 U/L   ALT 93 (H) 14 - 54 U/L   Alkaline Phosphatase 100 38 - 126 U/L   Total Bilirubin 1.4 (H) 0.3 - 1.2 mg/dL   GFR calc non Af Amer >60 >60 mL/min   GFR calc Af Amer >60 >60 mL/min    Comment: (NOTE) The eGFR has been calculated using the CKD EPI equation. This calculation has not been validated in all clinical situations. eGFR's persistently <60 mL/min signify possible Chronic Kidney Disease.    Anion gap NOT CALCULATED 5 - 15  Lipase, blood     Status: None   Collection Time: 11/11/15  1:25 PM  Result Value Ref Range   Lipase 30 11 - 51 U/L  Lactic acid, plasma     Status: None   Collection Time: 11/11/15  2:56 PM  Result Value Ref Range   Lactic Acid, Venous 1.8 0.5 - 2.0 mmol/L  Blood gas, arterial     Status: Abnormal   Collection Time: 11/11/15  4:00 PM  Result Value Ref Range   FIO2 0.28    Delivery systems NASAL CANNULA    pH, Arterial 7.61 (HH) 7.350 - 7.450    Comment: CRITICAL RESULT CALLED TO, READ BACK BY AND VERIFIED WITH: SUSAN RN AT 1608 ON 11/11/15    pCO2 arterial 67 (H) 32.0 - 48.0 mmHg   pO2, Arterial 74 (L) 83.0 - 108.0 mmHg   Bicarbonate 67.3 (H) 21.0 - 28.0 mEq/L    Comment: CRITICAL RESULT CALLED TO, READ BACK BY AND VERIFIED WITH: SUSAN AT 1608 ON 11/11/15    Acid-Base Excess 39.2 (H) 0.0 - 3.0 mmol/L   O2 Saturation 96.8 %   Patient temperature 37.0    Collection site RIGHT RADIAL    Sample type ARTERIAL DRAW    Allens test (pass/fail) POSITIVE (A) PASS  Salicylate level      Status: None   Collection Time: 11/11/15  4:05 PM  Result Value Ref Range   Salicylate Lvl <9.6 2.8 - 30.0 mg/dL   Ct Abdomen Pelvis W Contrast  11/11/2015  CLINICAL DATA:  Abd distention with tenderness, pt has ALS and is minimally verbal EXAM: CT ABDOMEN AND PELVIS WITH CONTRAST TECHNIQUE: Multidetector CT imaging of the abdomen and pelvis was performed using the standard protocol following bolus administration of intravenous contrast. CONTRAST:  168m OMNIPAQUE IOHEXOL 300 MG/ML  SOLN COMPARISON:  CT chest 01/12/2013 FINDINGS: Patchy subsegmental atelectasis or infiltrates in the posterior lung bases, new since prior study. No effusion. Patchy aortic calcifications. Unremarkable liver, nondilated gallbladder, spleen, adrenal glands, kidneys. Mild fatty infiltration of the pancreas without focal lesion. Portal vein patent. Small hiatal hernia. Stomach, small bowel, and colon are nondilated. Surgical clips medial to the cecum. Urinary bladder is physiologically distended. Uterus and adnexal regions unremarkable. Bilateral pelvic vascular calcifications. No ascites. No free air. No adenopathy localized. No hydronephrosis. Degenerative disc disease L5-S1. Regional bones otherwise unremarkable. IMPRESSION: 1. No acute abdominal process. 2. Patchy infiltrates or atelectasis in the visualized lung bases, new since prior study. 3. Small hiatal hernia. 4. Degenerative disc disease L5-S1. Electronically Signed   By: DLucrezia EuropeM.D.   On: 11/11/2015 15:18   Dg Chest Portable 1 View  11/11/2015  CLINICAL DATA:  59year old female with cough and congestion EXAM:  PORTABLE CHEST 1 VIEW COMPARISON:  Prior chest x-ray and chest CT 01/12/2013 FINDINGS: Stable cardiac and mediastinal contours. Atherosclerotic calcifications again noted in the transverse aorta. New linear atelectasis have a in the left lung base. No focal airspace consolidation. Mild bronchitic changes are similar compared to prior. No pulmonary edema,  pleural effusion or pneumothorax. No acute osseous abnormality. Incompletely imaged fixation hardware in the left proximal humerus. IMPRESSION: 1. Discoid atelectasis in the left lung base. 2. Otherwise, no acute cardiopulmonary process. Electronically Signed   By: Jacqulynn Cadet M.D.   On: 11/11/2015 12:30    Review of Systems  Constitutional: Negative for fever and chills.  HENT: Negative for sore throat and tinnitus.   Eyes: Negative for blurred vision and redness.  Respiratory: Negative for cough and shortness of breath.   Cardiovascular: Negative for chest pain, palpitations, orthopnea and PND.  Gastrointestinal: Negative for nausea, vomiting, abdominal pain and diarrhea.  Genitourinary: Negative for dysuria, urgency and frequency.  Musculoskeletal: Negative for myalgias and joint pain.  Skin: Negative for rash.       No lesions  Neurological: Negative for speech change, focal weakness and weakness.  Endo/Heme/Allergies: Does not bruise/bleed easily.       No temperature intolerance  Psychiatric/Behavioral: Negative for depression and suicidal ideas.    Blood pressure 151/85, pulse 84, temperature 97.8 F (36.6 C), temperature source Oral, resp. rate 18, height 5' 5"  (1.651 m), weight 97.977 kg (216 lb), SpO2 99 %. Physical Exam  Nursing note and vitals reviewed. Constitutional: She is oriented to person, place, and time. She appears well-developed and well-nourished. No distress.  HENT:  Head: Normocephalic and atraumatic.  Mouth/Throat: Oropharynx is clear and moist.  Eyes: Conjunctivae and EOM are normal. Pupils are equal, round, and reactive to light. No scleral icterus.  Neck: Normal range of motion. Neck supple. No JVD present. No tracheal deviation present. No thyromegaly present.  Cardiovascular: Normal rate, regular rhythm and normal heart sounds.  Exam reveals no gallop and no friction rub.   No murmur heard. Respiratory: Effort normal and breath sounds normal.  GI:  Soft. Bowel sounds are normal. She exhibits no distension. There is no tenderness.  Genitourinary:  Deferred  Musculoskeletal: Normal range of motion. She exhibits no edema.  Lymphadenopathy:    She has no cervical adenopathy.  Neurological: She is alert and oriented to person, place, and time. No cranial nerve deficit. She exhibits normal muscle tone.  Skin: Skin is warm and dry.  Psychiatric: Thought content normal.  Difficult to assess mental status as the patient is not always verbal. Her speech is very garbled. She weighs as if she is in pain but will very clearly states that she does not hurt. Thought content appears to be normal as evidenced by end-of-life discussion when she states to her boyfriend that she "wants to stay with him" area she nods her head to indicate understanding when I ask her about her wishes.     Assessment/Plan This is a 59 year old African female admitted for metabolic alkalosis and hypokalemia with lethargy and mental status changes. 1. Metabolic alkalosis: Initially we thought the patient may have a metabolic acidosis secondary to lactic acid and poor perfusion to her gut. However she was found to be alkalotic. Hypochloremia and hypokalemia likely secondary to chronic diuretic therapy. Intravascular volume appears to be okay as renal function is normal. I have asked the patient's daughter why she had been on such aggressive diuretic therapy. Apparently it is because she suffers  silicate swelling of her lower extremities due to her bedridden status secondary to ALS. At this time, risk of further metabolic derangement outweighs the benefit of decreasing lower extremity pain secondary to edema. I have discontinued her metolazone and changed her Lasix 80 mg twice a day to daily therapy. We will also skip today's second dose of Lasix and begin tomorrow with the new diuretic regimen. 2. Mental status changes: Somnolent to lethargic. Discontinue trazodone 3. Leukocytosis:  Source is unclear.  Blood cultures were obtained in the emergency department and she was given Zosyn. I have not continued antibiotics at this time as we do not have a source and because the patient does not meet criteria for sepsis. 4. Hepatitis C: Stable. Abdomen without significant fluid wave 5. ALS: Continue Cymbalta and Neurontin for symptomatic relief 6. DVT prophylaxis: Heparin 7. GI prophylaxis: None as the patient is a critically ill The patient is a full code for now. I have discussed CODE STATUS with the family who will continue to consider changing resuscitation status. Time spent on admission orders and patient care proximally 45 minutes  Harrie Foreman 11/11/2015, 4:47 PM

## 2015-11-11 NOTE — ED Notes (Addendum)
Pt's family states that pt's baseline is verbal, but very difficult to understand. During assessment, pt has started to moan, stating "help me" in very garbaled manner. Pt able to follow simple commands, but, per family, pt unable to move arms and legs due to ALS. Pt has wet cough.

## 2015-11-11 NOTE — ED Provider Notes (Signed)
[facility] Emergency Department Provider Note  ____________________________________________  Time seen: 1145  I have reviewed the triage vital signs and the nursing notes.  History by:  Daughter and husband, with very limited input from patient  HISTORY  Chief Complaint Altered Mental Status     HPI Cynthia Landry is a 59 y.o. female who has ALS. She states at [employer]. She has had decreased alertness and cognition through this week. Initially this was on Monday when she was first begun on trazodone. Due to what was seen as an abnormal reaction, this medication was discontinued but then restarted on Wednesday. Thursday and Friday the patient again had diminished cognition. She is not as communicative or as alert. Any medicine that could cause sedation, including the trazodone and her pain medications, or stop last night. She was sent to the emergency department this morning for further evaluation. Her daughter reports that she appears more alert now than she has over the past 2 days.  The patient's eyes are open and she looks around some. She answers some questions and a limited fashion. When I first asked her if she had any pain she appeared to say no. She did not respond to my other verbal questions. The family encouraged her to respond, anticipating that she usually would, but the patient was simply shake her head some. While she initially denied any other pain, during the exam, she clearly had more pain and began to cry and become uncomfortable during the abdominal exam.  Family tells me that the patient has also had a bad cough recently. She has had a negative chest x-ray.    Past Medical History  Diagnosis Date  . Amyotrophic lateral sclerosis (HCC)     There are no active problems to display for this patient.   History reviewed. No pertinent past surgical history.  No current outpatient prescriptions on file.  Allergies Aspirin; Hctz;  and Lisinopril  History reviewed. No pertinent family history.  Social History Social History  Substance Use Topics  . Smoking status: Unknown If Ever Smoked  . Smokeless tobacco: None  . Alcohol Use: None    Review of Systems Very limited review of systems due to the patient's reduced communication. Level V caveat  ____________________________________________   PHYSICAL EXAM:  VITAL SIGNS: ED Triage Vitals  Enc Vitals Group     BP 11/11/15 1125 138/96 mmHg     Pulse Rate 11/11/15 1125 91     Resp 11/11/15 1125 16     Temp 11/11/15 1125 97.8 F (36.6 C)     Temp Source 11/11/15 1125 Oral     SpO2 11/11/15 1125 92 %     Weight 11/11/15 1125 216 lb (97.977 kg)     Height 11/11/15 1125 5\' 5"  (1.651 m)     Head Cir --      Peak Flow --      Pain Score --      Pain Loc --      Pain Edu? --      Excl. in GC? --     Constitutional:  Alert, eyes open, but not very interactive. She does make eye contact occasionally. Initially in no acute distress, but became distressed during the physical exam of her abdomen. ENT   Head: Normocephalic and atraumatic.   Nose: No congestion/rhinnorhea.       Mouth: No erythema, no swelling, missing numerous teeth in her upper front.   Cardiovascular: Normal rate, regular rhythm, no  murmur noted Respiratory:  Patient with a good oxygen saturation level but without much effort in her respiratory function. She does have some coarse breath sounds and a bad sounding cough. Gastrointestinal: Mild distention with mild tautness in her abdomen. Apparent discomfort on palpation. Unable to 10 down to a focal location, it does appear to be a more diffuse process. Musculoskeletal:  Some atrophy with plantar flexion of her feet. No noted deformity otherwise. No acute swelling. Neurologic:  Limited communication. Limited muscular function. Patient with a last and limited exam at this time.  Skin:  Skin is warm, dry. No rash noted. Psychiatric: Limited  communication. Nonverbal. Began to cry out with discomfort during the exam as noted above.  ____________________________________________    LABS (pertinent positives/negatives)  Labs Reviewed  CBC WITH DIFFERENTIAL/PLATELET - Abnormal; Notable for the following:    WBC 19.7 (*)    RBC 5.26 (*)    Neutro Abs 15.7 (*)    Monocytes Absolute 1.4 (*)    All other components within normal limits  URINALYSIS COMPLETEWITH MICROSCOPIC ([facility] ONLY) - Abnormal; Notable for the following:    Color, Urine YELLOW (*)    APPearance CLEAR (*)    Hgb urine dipstick 1+ (*)    Squamous Epithelial / LPF 0-5 (*)    All other components within normal limits  COMPREHENSIVE METABOLIC PANEL - Abnormal; Notable for the following:    Sodium 124 (*)    Potassium 2.3 (*)    Chloride <65 (*)    CO2 47 (*)    Glucose, Bld 144 (*)    BUN 30 (*)    Total Protein 9.4 (*)    AST 104 (*)    ALT 93 (*)    Total Bilirubin 1.4 (*)    All other components within normal limits  CULTURE, BLOOD (ROUTINE X 2)  CULTURE, BLOOD (ROUTINE X 2)  LIPASE, BLOOD  COMPREHENSIVE METABOLIC PANEL  LIPASE, BLOOD  LACTIC ACID, PLASMA  BLOOD GAS, ARTERIAL     ____________________________________________   EKG  ED ECG REPORT I, Yerlin Gasparyan W, the attending physician, personally viewed and interpreted this ECG.   Date: 11/11/2015  EKG Time: 128  Rate: 87  Rhythm: Sinus rhythm with prolonged QTC at 650.  Axis: Normal  Intervals: Normal  ST&T Change: None noted   ____________________________________________    RADIOLOGY  Chest x-ray:  IMPRESSION: 1. Discoid atelectasis in the left lung base. 2. Otherwise, no acute cardiopulmonary process.   CT abdomen and pelvis:  IMPRESSION: 1. No acute abdominal process. 2. Patchy infiltrates or atelectasis in the visualized lung bases, new since prior study. 3. Small hiatal hernia. 4. Degenerative disc disease  L5-S1.    ____________________________________________   PROCEDURES CRITICAL CARE Performed by: Darien Ramus   Total critical care time: 35 minutes  Critical care time was exclusive of separately billable procedures and treating other patients.  Critical care was necessary to treat or prevent imminent or life-threatening deterioration.  Critical care was time spent personally by me on the following activities: development of treatment plan with patient and/or surrogate as well as nursing, discussions with consultants, evaluation of patient's response to treatment, examination of patient, obtaining history from patient or surrogate, ordering and performing treatments and interventions, ordering and review of laboratory studies, ordering and review of radiographic studies, pulse oximetry and re-evaluation of patient's condition.  ____________________________________________   INITIAL IMPRESSION / ASSESSMENT AND PLAN / ED COURSE  Pertinent labs & imaging results that were available during my care  of the patient were reviewed by me and considered in my medical decision making (see chart for details).  Difficult and limited exam for this patient with ALS and reduced cognition. All history point towards an association with the start of trazodone and her diminished cognition and communication. She is improved today with the cessation of trazodone last night.  The family reports she did has had some abdominal issues in the past. Currently her abdomen appears slightly distended and notably tender on exam. Given the limited history, I discussed getting a CT with the family, and they have agreed. We will also check a chest x-ray due to the notable cough and some coarse breath sounds at she has.  ----------------------------------------- 2:23 PM on 11/11/2015 -----------------------------------------  White count of 19.7 thousand and is noted. CT of abdomen is pending. Medical and a lactic  acid and blood cultures as well.  ----------------------------------------- 3:22 PM on 11/11/2015 -----------------------------------------  There are multiple metabolic abnormalities noted on lab testing. The chloride is less than 65. Sodium of 124, potassium of 2.3. This leads to a non-measurable anion gap. I've ordered an ABG. We will begin to replenish potassium as well as sodium and chloride with IV fluids. The CT scan does not reveal any acute abdominal process. The chest x-ray did not show any pneumonia but there are some patchy infiltrates noted through the chest CT. We will begin antibiotics at this time.  We will continue with normal saline but it potassium. We will also give the patient's sodium bicarbonate because of the prolonged QTC on her EKG.  The patient is critically ill and we will seek admission to hospital.   ____________________________________________   FINAL CLINICAL IMPRESSION(S) / ED DIAGNOSES  Final diagnoses:  Sepsis, due to unspecified organism (HCC)  Hyponatremia  Hypokalemia  Hypochloremia  Amyotrophic lateral sclerosis (ALS) (HCC)  Altered mental status, unspecified altered mental status type      Darien Ramus, MD 11/11/15 1537

## 2015-11-12 LAB — CBC
HCT: 35.8 % (ref 35.0–47.0)
Hemoglobin: 12 g/dL (ref 12.0–16.0)
MCH: 27.4 pg (ref 26.0–34.0)
MCHC: 33.5 g/dL (ref 32.0–36.0)
MCV: 81.7 fL (ref 80.0–100.0)
Platelets: 291 10*3/uL (ref 150–440)
RBC: 4.38 MIL/uL (ref 3.80–5.20)
RDW: 12.8 % (ref 11.5–14.5)
WBC: 12.1 10*3/uL — ABNORMAL HIGH (ref 3.6–11.0)

## 2015-11-12 LAB — BASIC METABOLIC PANEL
Anion gap: 11 (ref 5–15)
BUN: 14 mg/dL (ref 6–20)
CHLORIDE: 75 mmol/L — AB (ref 101–111)
CO2: 44 mmol/L — AB (ref 22–32)
CREATININE: 0.38 mg/dL — AB (ref 0.44–1.00)
Calcium: 8.8 mg/dL — ABNORMAL LOW (ref 8.9–10.3)
GFR calc Af Amer: >60 mL/min (ref >60)
GFR calc non Af Amer: >60 mL/min (ref >60)
Glucose, Bld: 112 mg/dL — ABNORMAL HIGH (ref 65–99)
POTASSIUM: 3.1 mmol/L — AB (ref 3.5–5.1)
Sodium: 130 mmol/L — ABNORMAL LOW (ref 135–145)

## 2015-11-12 LAB — MRSA PCR SCREENING: MRSA by PCR: NEGATIVE

## 2015-11-12 NOTE — Progress Notes (Signed)
[facility] [place] [facility]   PATIENT NAME: Cynthia Landry    MR#:  [id number]  DATE OF BIRTH:  Nov 04, 1956  SUBJECTIVE:  CHIEF COMPLAINT:   Chief Complaint  Patient presents with  . Altered Mental Status   Pt. Here due to lethargy, AMS.  A bit more awake today.  Boyfriend at bedside.   REVIEW OF SYSTEMS:    Review of Systems  Unable to perform ROS: mental acuity    Nutrition: NPO Tolerating Diet: Not Yet Tolerating PT: Await Eval.    DRUG ALLERGIES:   Allergies  Allergen Reactions  . Aspirin Other (See Comments)    dizziness  . Hctz [Hydrochlorothiazide] Other (See Comments)    cramping  . Lisinopril Cough    VITALS:  Blood pressure 127/71, pulse 83, temperature 97.9 F (36.6 C), temperature source Oral, resp. rate 18, height  (1.651 m), weight 92.987 kg (205 lb), SpO2 100 %.  PHYSICAL EXAMINATION:   Physical Exam  GENERAL:  59 y.o.-year-old patient lying in the bed lethargic/encephalopathic.   EYES: Pupils equal, round, reactive to light and accommodation. No scleral icterus. Extraocular muscles intact.  HEENT: Head atraumatic, normocephalic. Oropharynx and nasopharynx clear.  NECK:  Supple, no jugular venous distention. No thyroid enlargement, no tenderness.  LUNGS: Poor Resp. effort, no wheezing, rales, rhonchi. No use of accessory muscles of respiration.  CARDIOVASCULAR: S1, S2 normal. No murmurs, rubs, or gallops.  ABDOMEN: Soft, nontender, nondistended. Bowel sounds present. No organomegaly or mass.  EXTREMITIES: No cyanosis, clubbing or edema b/l.    NEUROLOGIC: Cranial nerves II through XII are intact. No focal Motor or sensory deficits b/l.  Lethargic/Encephalopathic.  Follows simple commands.  PSYCHIATRIC: The patient is alert and oriented x 1. Lethargic.  SKIN: No obvious rash, lesion, or ulcer.    LABORATORY PANEL:   CBC  Recent Labs Lab 11/12/15 1114  WBC 12.1*  HGB 12.0  HCT 35.8  PLT 291    ------------------------------------------------------------------------------------------------------------------  Chemistries   Recent Labs Lab 11/11/15 1325 11/11/15 1605 11/12/15 0959  NA 124*  --  130*  K 2.3*  --  3.1*  CL <65*  --  75*  CO2 47*  --  44*  GLUCOSE 144*  --  112*  BUN 30*  --  14  CREATININE 0.57  --  0.38*  CALCIUM 9.2  --  8.8*  MG  --  2.3  --   AST 104*  --   --   ALT 93*  --   --   ALKPHOS 100  --   --   BILITOT 1.4*  --   --    ------------------------------------------------------------------------------------------------------------------  Cardiac Enzymes No results for input(s): TROPONINI in the last 168 hours. ------------------------------------------------------------------------------------------------------------------  RADIOLOGY:  Ct Abdomen Pelvis W Contrast  11/11/2015  CLINICAL DATA:  Abd distention with tenderness, pt has ALS and is minimally verbal EXAM: CT ABDOMEN AND PELVIS WITH CONTRAST TECHNIQUE: Multidetector CT imaging of the abdomen and pelvis was performed using the standard protocol following bolus administration of intravenous contrast. CONTRAST:  OMNIPAQUE IOHEXOL 300 MG/ML  SOLN COMPARISON:  CT chest 01/12/2013 FINDINGS: Patchy subsegmental atelectasis or infiltrates in the posterior lung bases, new since prior study. No effusion. Patchy aortic calcifications. Unremarkable liver, nondilated gallbladder, spleen, adrenal glands, kidneys. Mild fatty infiltration of the pancreas without focal lesion. Portal vein patent. Small hiatal hernia. Stomach, small bowel, and colon are nondilated. Surgical clips medial to the cecum. Urinary bladder is physiologically distended. Uterus  and adnexal regions unremarkable. Bilateral pelvic vascular calcifications. No ascites. No free air. No adenopathy localized. No hydronephrosis. Degenerative disc disease L5-S1. Regional bones otherwise unremarkable. IMPRESSION: 1. No acute abdominal  process. 2. Patchy infiltrates or atelectasis in the visualized lung bases, new since prior study. 3. Small hiatal hernia. 4. Degenerative disc disease L5-S1. Electronically Signed   By: Corlis Leak M.D.   On: 11/11/2015 15:18   Dg Chest Portable 1 View  11/11/2015  CLINICAL DATA:  59 year old female with cough and congestion EXAM: PORTABLE CHEST 1 VIEW COMPARISON:  Prior chest x-ray and chest CT 01/12/2013 FINDINGS: Stable cardiac and mediastinal contours. Atherosclerotic calcifications again noted in the transverse aorta. New linear atelectasis have a in the left lung base. No focal airspace consolidation. Mild bronchitic changes are similar compared to prior. No pulmonary edema, pleural effusion or pneumothorax. No acute osseous abnormality. Incompletely imaged fixation hardware in the left proximal humerus. IMPRESSION: 1. Discoid atelectasis in the left lung base. 2. Otherwise, no acute cardiopulmonary process. Electronically Signed   By: Malachy Moan M.D.   On: 11/11/2015 12:30     ASSESSMENT AND PLAN:   59 year old female with past medical history of ALS, hepatitis C, hypothyroidism, depression, anxiety, hypertension who presented to the hospital due to lethargy and altered mental status.  #1 altered mental status-metabolic encephalopathy due to polypharmacy and electrolyte abnormalities. -Patient's mental status is somewhat improved today.  -Continue to hold patient's sedative meds including oxycodone, trazodone and will monitor mental status.  #2 hyponatremia-due to dehydration and use of diuretics. -Improved with IV fluids and will monitor. Hold metolazone and cont. Lower dose Lasix.  #3 metabolic alkalosis-due to increasing use of diuretics. Patient's metolazone on hold and cont. Lower dose Lasix.   -Continue IV fluids and it's improving.  #4 hypokalemia-continue supplement and it's improving. Hold diuretics.  #5 history of hepatitis C-I will check an ammonia level to r/o Hepatic  encephalopathy.  - LFT's stable and will monitor.   #6 Depression - cont. Cymbalta, Seroquel.    #7 Hypothyroidism - cont. Synthroid.   #8 ALS - cont. Neurontin  All the records are reviewed and case discussed with Care Management/Social Workerr. Management plans discussed with the patient, family and they are in agreement.  CODE STATUS: Full  DVT Prophylaxis: Heparin SQ.   TOTAL TIME TAKING CARE OF THIS PATIENT: 30 minutes.   POSSIBLE D/C IN 1-2 DAYS, DEPENDING ON CLINICAL CONDITION.   Houston Siren M.D on 11/12/2015 at 12:12 PM  Between 7am to 6pm - Pager - [phone]  After 6pm go to [email or web address] - password [email or web address]  [facility]  Office  [phone]  CC: Primary care physician; No PCP Per Patient

## 2015-11-12 NOTE — Plan of Care (Signed)
Problem: Safety: Goal: Ability to remain free from injury will improve Outcome: Progressing Pt is low falls due to immobility, bed alarm in use, call bell and phone within reach, pt remains free of injury this shift  Problem: Pain Managment: Goal: General experience of comfort will improve Outcome: Progressing No c/o pain, no signs of pain, pt resting comfortably in bed  Problem: Activity: Goal: Risk for activity intolerance will decrease Outcome: Progressing Pt has been sleeping most of the day, woke to take meds and when family was present.  Problem: Fluid Volume: Goal: Ability to maintain a balanced intake and output will improve Outcome: Progressing VSS, afebrile, IVF infusing, potassium improving  Problem: Nutrition: Goal: Adequate nutrition will be maintained Outcome: Progressing Pt remains NPO except for sips with meds, pt failed bedside swallow screen, speech eval pending

## 2015-11-13 LAB — BASIC METABOLIC PANEL
ANION GAP: 9 (ref 5–15)
BUN: 12 mg/dL (ref 6–20)
CALCIUM: 9.1 mg/dL (ref 8.9–10.3)
CO2: 46 mmol/L — ABNORMAL HIGH (ref 22–32)
Chloride: 78 mmol/L — ABNORMAL LOW (ref 101–111)
Creatinine, Ser: 0.48 mg/dL (ref 0.44–1.00)
GFR calc Af Amer: >60 mL/min (ref >60)
GLUCOSE: 105 mg/dL — AB (ref 65–99)
POTASSIUM: 2.6 mmol/L — AB (ref 3.5–5.1)
SODIUM: 133 mmol/L — AB (ref 135–145)

## 2015-11-13 LAB — AMMONIA: AMMONIA: 30 umol/L (ref 9–35)

## 2015-11-13 MED ORDER — POTASSIUM CHLORIDE CRYS ER 20 MEQ PO TBCR
40.0000 meq | EXTENDED_RELEASE_TABLET | Freq: Two times a day (BID) | ORAL | Status: AC
  Administered 2015-11-13 (×2): 40 meq via ORAL
  Filled 2015-11-13 (×2): qty 2

## 2015-11-13 MED ORDER — MORPHINE SULFATE ER 15 MG PO TBCR
15.0000 mg | EXTENDED_RELEASE_TABLET | Freq: Two times a day (BID) | ORAL | Status: DC
  Administered 2015-11-13 – 2015-11-14 (×2): 15 mg via ORAL
  Filled 2015-11-13 (×2): qty 1

## 2015-11-13 MED ORDER — POTASSIUM CHLORIDE 10 MEQ/100ML IV SOLN
10.0000 meq | INTRAVENOUS | Status: AC
  Administered 2015-11-13 (×3): 10 meq via INTRAVENOUS
  Filled 2015-11-13 (×3): qty 100

## 2015-11-13 NOTE — Plan of Care (Signed)
Pt w/Hx of ALS continues to be hypokalemic today.  Critical of 2.6 - replaced w/PO and IV K+.  Pt lethargic after getting MS contin today - dr reduced freq to bid instead of tid.  Believes metabolic alkalosis caused by diuretics - does lowered today.  Ammonia level checked - WNL of 30.  Pt had speech eval today and was put on dysphagia 2 diet w/ thin liquids. Daughter at bedside all day - good family support.  Pt had no c/o pain.  Fluids are continued

## 2015-11-13 NOTE — Plan of Care (Signed)
Problem: Nutrition: Goal: Adequate nutrition will be maintained Outcome: Progressing Pt alert and oriented. Family at bedside. No c/o pain nor distress noted. Continues on iv fluids. NPO, speech evaluation in place. Continue to monitor

## 2015-11-13 NOTE — Evaluation (Signed)
Clinical/Bedside Swallow Evaluation Patient Details  Name: Cynthia Landry MRN: [id number] Date of Birth: October 19, 1956  Today's Date: 11/13/2015 Time: SLP Start Time (ACUTE ONLY): 8295 SLP Stop Time (ACUTE ONLY): 1005 SLP Time Calculation (min) (ACUTE ONLY): 60 min  Past Medical History:  Past Medical History  Diagnosis Date  . Amyotrophic lateral sclerosis (HCC)   . Hepatitis C   . Thyroid disease   . Depression   . Hypertension   . Anxiety    Past Surgical History:  Past Surgical History  Procedure Laterality Date  . Shoulder surgery     HPI:  patient with past medical history significant for ALS and Hep C presents to the emergency department via EMS from her nursing home due to excessive somnolence. The patient's boyfriend states that she has become more and more lethargic since she was started on trazodone. In the emergency department laboratory evaluation revealed multiple electrolyte abnormalities. The patient's abdomen was distended and she appeared to be in pain which found to the emergency department staff to obtain a CT of her abdomen. No acute process was seen on imaging. An ABG was obtained which showed metabolic alkalosis with compensatory respiratory acidosis. Due to multiple medical problems and lethargy the emergency department staff called for admission. Pt is more awake today and verbal w/ SLP and family. Pt's speech is low in volume and slower but at her baseline per family and pt.    Assessment / Plan / Recommendation Clinical Impression  Pt appeared to adequately tolerate trials of thin liquids via straw (pinched to limit bolus size) and puree (di dnot have any increased texture that would be rec'd). Pt mostly consumed trials of thin liquids of ~3-4 ozs w/ no immediate coughing noted. Pt did exhibit belching and a mild cough x2 during session but this did not appear related to the po trials given. Oral phase c/b slower oral management(lingual movements) but given time,  pt cleared appropriately. Family member present indicated pt appeared at her baseline w/ po's - pt's food had been changed to Dys. 3 at the facility per their report. Pt required full feeding assist sec. to UE weakness d/t ALS. Pt does appear to have increased risk for aspiration w/ po's d/t weakness and ALS. It would be rec'd to strictly manage bolus size and pace when feeding pt. MD consulted and agreed w/ initiation of an oral diet w/ strict aspiration precautions; feeding assist; meds in Puree - Crushed as able. NSG to monitor 100% w/ po's and if any overt s/s of aspiration are noted, then discontinue diet and inform MD/SLP.     Aspiration Risk  Mild aspiration risk    Diet Recommendation  Dys. 2 w/ thin liquids; strict aspiration precautions and full feeding assistance at meals. 100% supervision.  Medication Administration: Whole meds with puree (crushed if nec/able )    Other  Recommendations Recommended Consults:  (dietician) Oral Care Recommendations: Oral care BID;Staff/trained caregiver to provide oral care   Follow up Recommendations   (TBD)    Frequency and Duration min 3x week  1 week       Prognosis Prognosis for Safe Diet Advancement: Fair (-good) Barriers to Reach Goals:  (ALS)      Swallow Study   General Date of Onset: 11/11/15 HPI: patient with past medical history significant for ALS and Hep C presents to the emergency department via EMS from her nursing home due to excessive somnolence. The patient's boyfriend states that she has become more and more  lethargic since she was started on trazodone. In the emergency department laboratory evaluation revealed multiple electrolyte abnormalities. The patient's abdomen was distended and she appeared to be in pain which found to the emergency department staff to obtain a CT of her abdomen. No acute process was seen on imaging. An ABG was obtained which showed metabolic alkalosis with compensatory respiratory acidosis. Due to  multiple medical problems and lethargy the emergency department staff called for admission. Pt is more awake today and verbal w/ SLP and family. Pt's speech is low in volume and slower but at her baseline per family and pt.  Type of Study: Bedside Swallow Evaluation Previous Swallow Assessment: not this admission Diet Prior to this Study: Dysphagia 3 (soft);Thin liquids (per family) Temperature Spikes Noted: No (wbc 12.1) Respiratory Status: Nasal cannula (2-3 liters) History of Recent Intubation: No Behavior/Cognition: Alert;Cooperative;Pleasant mood Oral Cavity Assessment: Within Functional Limits (grossly) Oral Care Completed by SLP: Yes Oral Cavity - Dentition: Adequate natural dentition Vision:  (n/a) Self-Feeding Abilities: Total assist (UE weakness; ALS) Patient Positioning: Upright in bed Baseline Vocal Quality: Low vocal intensity (slower speech rate) Volitional Cough: Strong Volitional Swallow: Able to elicit    Oral/Motor/Sensory Function Overall Oral Motor/Sensory Function: Mild impairment Facial Symmetry: Within Functional Limits Lingual ROM: Within Functional Limits (slower coordination) Lingual Symmetry: Within Functional Limits Lingual Strength: Within Functional Limits (grossly) Velum: Within Functional Limits Mandible: Within Functional Limits   Ice Chips Ice chips: Within functional limits Presentation: Spoon (fed; 3 trials)   Thin Liquid Thin Liquid: Within functional limits Presentation: Straw (baseline use for pt; ~3-4 ozs total via pinched straw) Other Comments: pt tended to take larger sips from the straw; utilized pinched straw for control of bolus size    Nectar Thick Nectar Thick Liquid: Not tested   Honey Thick Honey Thick Liquid: Not tested   Puree Puree: Impaired Presentation: Spoon (fed; 6 trials) Oral Phase Impairments: Reduced lingual movement/coordination (min. ) Oral Phase Functional Implications:  (slower oral phase time) Pharyngeal Phase  Impairments:  (none) Other Comments: given time, pt cleared appropriately   Solid Solid: Not tested Other Comments: did not have the level 2 foods available      Jerilynn Som, MS, CCC-SLP  Erleen Egner 11/13/2015,10:14 AM

## 2015-11-13 NOTE — Clinical Social Work Note (Signed)
Clinical Social Work Assessment  Patient Details  Name: Cynthia Landry MRN: [id number] Date of Birth: [birth year]  Date of referral:  11/13/15               Reason for consult:  Facility Placement                Permission sought to share information with:  Family Supports Permission granted to share information::  Yes, Verbal Permission Granted  Name::     daughter, Kristeen Miss [phone])  Housing/Transportation Living arrangements for the past 2 months:  [place] of Information:  Patient, Adult Children Patient Interpreter Needed:  None Criminal Activity/Legal Involvement Pertinent to Current Situation/Hospitalization:  No - Comment as needed Significant Relationships:  Adult Children Lives with:  Facility Resident Do you feel safe going back to the place where you live?  Yes Need for family participation in patient care:  Yes (Comment)  Care giving concerns:  No care giving concerns identified.   Social Worker assessment / plan:  CSW met with pt to address consult as pt was admitted from a facility. CSW introduced herself and explained role of social work. CSW also explained process of returning to SNF.   Pt was admitted from [facility], where she is a long term care patient. Pt has been at [facility] since October of 2015. Pt and daughter are in agreement with pt returning to SNF when stable. CSW updated facility and pt is able to return at discharge.   CSW will continue to follow.   Employment status:  Disabled (Comment on whether or not currently receiving Disability) Insurance information:  Medicare, Medicaid In [place] PT Recommendations:  Not assessed at this time Information / Referral to community resources:  [place]  Patient/Family's Response to care:  Pt and daughter were appreciative of CSW support.   Patient/Family's Understanding of and Emotional Response to Diagnosis, Current Treatment, and Prognosis:  Pt and pt's  daughter were in agreement with discharge plan as pt needs a higher level of care.   Emotional Assessment Appearance:  Appears stated age Attitude/Demeanor/Rapport:   (Appropriate) Affect (typically observed):  Accepting Orientation:  Oriented to Self, Oriented to Place, Oriented to  Time, Oriented to Situation Alcohol / Substance use:  Never Used Psych involvement (Current and /or in the community):  No (Comment)  Discharge Needs  Concerns to be addressed:  No discharge needs identified Readmission within the last 30 days:  No Current discharge risk:  None Barriers to Discharge:  No Barriers Identified   Darden Dates, LCSW 11/13/2015, 11:44 AM

## 2015-11-13 NOTE — Plan of Care (Signed)
Problem: SLP Dysphagia Goals Goal: Misc Dysphagia Goal Pt will safely tolerate po diet of least restrictive consistency w/ no overt s/s of aspiration noted by Staff/pt/family x3 sessions.    

## 2015-11-13 NOTE — NC FL2 (Signed)
[place] MEDICAID FL2 LEVEL OF CARE SCREENING TOOL     IDENTIFICATION  Patient Name: Cynthia Landry Birthdate: 05/17/1956 Sex: female Admission Date (Current Location): 11/11/2015  [place] and [place] Number:  Chiropodist and Address:  [facility], [address] [place], [place] [address]      Provider Number: [id number]  Attending Physician Name and Address:  Houston Siren, MD  Relative Name and Phone Number:       Current Level of Care: Hospital Recommended Level of Care: Skilled Nursing Facility Prior Approval Number:    Date Approved/Denied:   PASRR Number: [id number]  Discharge Plan: SNF    Current Diagnoses: Patient Active Problem List   Diagnosis Date Noted  . Metabolic acidosis 11/11/2015    Orientation RESPIRATION BLADDER Height & Weight    Self, Time, Situation, Place  Normal    (165.1 cm) 211 lbs.  BEHAVIORAL SYMPTOMS/MOOD NEUROLOGICAL BOWEL NUTRITION STATUS        Diet (Dys 2, Nectar Thick)  AMBULATORY STATUS COMMUNICATION OF NEEDS Skin   Extensive Assist Verbally Normal                       Personal Care Assistance Level of Assistance  Bathing, Feeding, Dressing Bathing Assistance: Maximum assistance Feeding assistance: Maximum assistance Dressing Assistance: Maximum assistance Total Care Assistance: Maximum assistance   Functional Limitations Info  Sight, Hearing, Speech Sight Info: Adequate Hearing Info: Adequate Speech Info: Impaired    SPECIAL CARE FACTORS FREQUENCY  Speech therapy                    Contractures      Additional Factors Info  Code Status, Allergies Code Status Info: Full Code Allergies Info: ASPIRIN, HCTZ, LISINOPRIL           Current Medications (11/13/2015):  This is the current hospital active medication list Current Facility-Administered Medications  Medication Dose Route Frequency Provider Last Rate Last Dose  . 0.9 %  sodium chloride  infusion   Intravenous Continuous Houston Siren, MD 75 mL/hr at 11/13/15 0033    . acetaminophen (TYLENOL) tablet 650 mg  650 mg Oral Q6H PRN Arnaldo Natal, MD       Or  . acetaminophen (TYLENOL) suppository 650 mg  650 mg Rectal Q6H PRN Arnaldo Natal, MD      . antiseptic oral rinse (CPC / CETYLPYRIDINIUM CHLORIDE 0.05%) solution 7 mL  7 mL Mouth Rinse BID Arnaldo Natal, MD   7 mL at 11/13/15 1047  . bisacodyl (DULCOLAX) EC tablet 10 mg  10 mg Oral Daily PRN Arnaldo Natal, MD      . bisacodyl (DULCOLAX) suppository 10 mg  10 mg Rectal Daily PRN Arnaldo Natal, MD      . calcium carbonate (TUMS - dosed in mg elemental calcium) chewable tablet 500 mg  500 mg Oral Daily Arnaldo Natal, MD   500 mg at 11/13/15 1046  . docusate sodium (COLACE) capsule 100 mg  100 mg Oral BID Arnaldo Natal, MD   100 mg at 11/13/15 1046  . DULoxetine (CYMBALTA) DR capsule 90 mg  90 mg Oral Clarita Leber, MD   90 mg at 11/13/15 2841  . fentaNYL (SUBLIMAZE) injection 50 mcg  50 mcg Intravenous Q15 min PRN Darien Ramus, MD   50 mcg at 11/11/15 1546  . furosemide (LASIX) tablet 80 mg  80 mg  Oral Daily Arnaldo Natal, MD   80 mg at 11/13/15 1045  . gabapentin (NEURONTIN) capsule 600 mg  600 mg Oral BID Arnaldo Natal, MD   600 mg at 11/13/15 1046  . heparin injection 5,000 Units  5,000 Units Subcutaneous 3 times per day Arnaldo Natal, MD   5,000 Units at 11/13/15 0540  . levothyroxine (SYNTHROID, LEVOTHROID) tablet 75 mcg  75 mcg Oral QAC breakfast Arnaldo Natal, MD   75 mcg at 11/13/15 1046  . morphine (MS CONTIN) 12 hr tablet 15 mg  15 mg Oral TID Arnaldo Natal, MD   15 mg at 11/13/15 1046  . morphine 2 MG/ML injection 2 mg  2 mg Intravenous Q4H PRN Arnaldo Natal, MD      . ondansetron [facility]) tablet 4 mg  4 mg Oral Q6H PRN Arnaldo Natal, MD       Or  . ondansetron [facility]) injection 4 mg  4 mg Intravenous Q6H PRN Arnaldo Natal, MD      .  pantoprazole (PROTONIX) EC tablet 40 mg  40 mg Oral QAC breakfast Arnaldo Natal, MD   40 mg at 11/13/15 1048  . potassium chloride SA (K-DUR,KLOR-CON) CR tablet 40 mEq  40 mEq Oral BID Oralia Manis, MD   40 mEq at 11/13/15 0539  . promethazine (PHENERGAN) injection 25 mg  25 mg Intramuscular Q6H PRN Arnaldo Natal, MD      . QUEtiapine (SEROQUEL) tablet 25 mg  25 mg Oral BID Arnaldo Natal, MD   25 mg at 11/13/15 1046  . sodium chloride 0.9 % injection 3 mL  3 mL Intravenous Q12H Arnaldo Natal, MD   3 mL at 11/11/15 2200  . Vitamin D (Ergocalciferol) (DRISDOL) capsule 50,000 Units  50,000 Units Oral Weekly Arnaldo Natal, MD         Discharge Medications: Please see discharge summary for a list of discharge medications.  Relevant Imaging Results:  Relevant Lab Results:   Additional Information SSN:  [id number]  Dede Query, LCSW

## 2015-11-13 NOTE — Progress Notes (Signed)
[facility] - [place] at [facility]   PATIENT NAME: Cynthia Landry    MR#:  [id number]  DATE OF BIRTH:  03/27/1956  SUBJECTIVE:  CHIEF COMPLAINT:   Chief Complaint  Patient presents with  . Altered Mental Status   Pt. Here due to lethargy, AMS.  Lethargic but follows simple commands.  Pt's daughter at bedside. Difficult to understand patient at times.     REVIEW OF SYSTEMS:    Review of Systems  Unable to perform ROS: mental acuity    Nutrition: seen by Speech and a started on Dysphagia 2 diet.  Tolerating Diet: Yes Tolerating PT: Pt. Is bedbound at baseline.    DRUG ALLERGIES:   Allergies  Allergen Reactions  . Aspirin Other (See Comments)    dizziness  . Hctz [Hydrochlorothiazide] Other (See Comments)    cramping  . Lisinopril Cough    VITALS:  Blood pressure 108/61, pulse 89, temperature 98 F (36.7 C), temperature source Oral, resp. rate 17, height  (1.651 m), weight 95.845 kg (211 lb 4.8 oz), SpO2 100 %.  PHYSICAL EXAMINATION:   Physical Exam  GENERAL:  59 y.o.-year-old patient lying in the bed lethargic but follows simple commands.   EYES: Pupils equal, round, reactive to light. No scleral icterus. Extraocular muscles intact.  HEENT: Head atraumatic, normocephalic. Oropharynx and nasopharynx clear.  NECK:  Supple, no jugular venous distention. No thyroid enlargement, no tenderness.  LUNGS: Poor Resp. effort, no wheezing, rales, rhonchi. No use of accessory muscles of respiration.  CARDIOVASCULAR: S1, S2 normal. No murmurs, rubs, or gallops.  ABDOMEN: Soft, nontender, nondistended. Bowel sounds present. No organomegaly or mass.  EXTREMITIES: No cyanosis, clubbing, + 1 dependent edema b/l.    NEUROLOGIC: Cranial nerves II through XII are intact. No focal Motor or sensory deficits b/l. Bedbound and globally weak, Follows simple commands.  PSYCHIATRIC: The patient is alert and oriented x 1. Lethargic.  SKIN: No obvious rash,  lesion, or ulcer.    LABORATORY PANEL:   CBC  Recent Labs Lab 11/12/15 1114  WBC 12.1*  HGB 12.0  HCT 35.8  PLT 291   ------------------------------------------------------------------------------------------------------------------  Chemistries   Recent Labs Lab 11/11/15 1325 11/11/15 1605  11/13/15 0420  NA 124*  --   < > 133*  K 2.3*  --   < > 2.6*  CL <65*  --   < > 78*  CO2 47*  --   < > 46*  GLUCOSE 144*  --   < > 105*  BUN 30*  --   < > 12  CREATININE 0.57  --   < > 0.48  CALCIUM 9.2  --   < > 9.1  MG  --  2.3  --   --   AST 104*  --   --   --   ALT 93*  --   --   --   ALKPHOS 100  --   --   --   BILITOT 1.4*  --   --   --   < > = values in this interval not displayed. ------------------------------------------------------------------------------------------------------------------  Cardiac Enzymes No results for input(s): TROPONINI in the last 168 hours. ------------------------------------------------------------------------------------------------------------------  RADIOLOGY:  Ct Abdomen Pelvis W Contrast  11/11/2015  CLINICAL DATA:  Abd distention with tenderness, pt has ALS and is minimally verbal EXAM: CT ABDOMEN AND PELVIS WITH CONTRAST TECHNIQUE: Multidetector CT imaging of the abdomen and pelvis was performed using the standard protocol following bolus administration of intravenous contrast.  CONTRAST:  OMNIPAQUE IOHEXOL 300 MG/ML  SOLN COMPARISON:  CT chest 01/12/2013 FINDINGS: Patchy subsegmental atelectasis or infiltrates in the posterior lung bases, new since prior study. No effusion. Patchy aortic calcifications. Unremarkable liver, nondilated gallbladder, spleen, adrenal glands, kidneys. Mild fatty infiltration of the pancreas without focal lesion. Portal vein patent. Small hiatal hernia. Stomach, small bowel, and colon are nondilated. Surgical clips medial to the cecum. Urinary bladder is physiologically distended. Uterus and adnexal regions  unremarkable. Bilateral pelvic vascular calcifications. No ascites. No free air. No adenopathy localized. No hydronephrosis. Degenerative disc disease L5-S1. Regional bones otherwise unremarkable. IMPRESSION: 1. No acute abdominal process. 2. Patchy infiltrates or atelectasis in the visualized lung bases, new since prior study. 3. Small hiatal hernia. 4. Degenerative disc disease L5-S1. Electronically Signed   By: Corlis Leak M.D.   On: 11/11/2015 15:18     ASSESSMENT AND PLAN:   59 year old female with past medical history of ALS, hepatitis C, hypothyroidism, depression, anxiety, hypertension who presented to the hospital due to lethargy and altered mental status.  #1 altered mental status-metabolic encephalopathy due to polypharmacy and electrolyte abnormalities. - spoke to daughter today pt. Was recently started on Trazodone and dose increased which is what led to AMS.  Trazodone on hold now.  -Patient's mental status is improving slowly and will cont. To monitor.   - I will taper pt's MS contin to BID instead of TID.    #2 hyponatremia-due to dehydration and use of diuretics. -Improved with IV fluids and will monitor. Hold metolazone and cont. Lower dose Lasix.  #3 metabolic alkalosis-due to increasing use of diuretics. Patient's metolazone on hold and cont. Lower dose Lasix.   -Continue IV fluids and it's improving.  #4 hypokalemia-continue to supplement and will monitor. - Mg level normal.   #5 history of hepatitis C- no acute issue, I will check Ammonia level - LFT's stable and will monitor.   #6 Depression - cont. Cymbalta, Seroquel.    #7 Hypothyroidism - cont. Synthroid.   #8 ALS - cont. Neurontin  Possible d/c back to SNF (long term care) in next 1-2 days.   All the records are reviewed and case discussed with Care Management/Social Workerr. Management plans discussed with the patient, family and they are in agreement.  CODE STATUS: Full  DVT Prophylaxis: Heparin SQ.    TOTAL TIME TAKING CARE OF THIS PATIENT: 30 minutes.   POSSIBLE D/C IN 1-2 DAYS, DEPENDING ON CLINICAL CONDITION.   Houston Siren M.D on 11/13/2015 at 1:42 PM  Between 7am to 6pm - Pager - [phone]  After 6pm go to [email or web address] - password [email or web address] [facility]  [place] [facility]  Office  [phone]  CC: Primary care physician; No PCP Per Patient

## 2015-11-13 NOTE — Care Management Important Message (Signed)
Important Message  Patient Details  Name: Cynthia Landry MRN: [id number] Date of Birth: 07-Jun-1956   Medicare Important Message Given:  Yes    Gwenette Greet, RN 11/13/2015, 9:41 AM

## 2015-11-14 LAB — BASIC METABOLIC PANEL
Anion gap: 10 (ref 5–15)
BUN: 9 mg/dL (ref 6–20)
CHLORIDE: 83 mmol/L — AB (ref 101–111)
CO2: 39 mmol/L — AB (ref 22–32)
CREATININE: 0.43 mg/dL — AB (ref 0.44–1.00)
Calcium: 9.2 mg/dL (ref 8.9–10.3)
GFR calc Af Amer: >60 mL/min (ref >60)
GFR calc non Af Amer: >60 mL/min (ref >60)
Glucose, Bld: 84 mg/dL (ref 65–99)
Potassium: 3.3 mmol/L — ABNORMAL LOW (ref 3.5–5.1)
Sodium: 132 mmol/L — ABNORMAL LOW (ref 135–145)

## 2015-11-14 MED ORDER — FUROSEMIDE 80 MG PO TABS: 80.0000 mg | ORAL_TABLET | Freq: Every day | ORAL | Status: DC

## 2015-11-14 MED ORDER — POTASSIUM CHLORIDE 20 MEQ/15ML (10%) PO SOLN
40.0000 meq | Freq: Once | ORAL | Status: AC
  Administered 2015-11-14: 08:00:00 40 meq via ORAL
  Filled 2015-11-14: qty 30

## 2015-11-14 MED ORDER — OXYCODONE HCL 5 MG PO TABS: 5.0000 mg | ORAL_TABLET | Freq: Four times a day (QID) | ORAL | Status: DC | PRN

## 2015-11-14 MED ORDER — MORPHINE SULFATE ER 15 MG PO TBCR: 15.0000 mg | EXTENDED_RELEASE_TABLET | Freq: Two times a day (BID) | ORAL | Status: DC

## 2015-11-14 NOTE — Discharge Summary (Signed)
[facility] - [place] at [facility]   PATIENT NAME: Cynthia Landry    MR#:  [id number]  DATE OF BIRTH:  [birth year]  DATE OF ADMISSION:  11/11/2015 ADMITTING PHYSICIAN: Arnaldo Natal, MD  DATE OF DISCHARGE: 11/14/2015 PRIMARY CARE PHYSICIAN: No PCP Per Patient    ADMISSION DIAGNOSIS:  Hypokalemia [E87.6] Hypochloremia [E87.8] Hyponatremia [E87.1] Amyotrophic lateral sclerosis (ALS) (HCC) [G12.21] Sepsis, due to unspecified organism (HCC) [A41.9] Altered mental status, unspecified altered mental status type [R41.82]  DISCHARGE DIAGNOSIS:  Active Problems:   Metabolic acidosis AMS/metabolic encephalopthy hyponatremia  SECONDARY DIAGNOSIS:   Past Medical History  Diagnosis Date  . Amyotrophic lateral sclerosis (HCC)   . Hepatitis C   . Thyroid disease   . Depression   . Hypertension   . Anxiety     HOSPITAL COURSE:   59 year old female with past medical history of ALS, hepatitis C, hypothyroidism, depression, anxiety, hypertension who presented to the hospital due to lethargy and altered mental status.  1 altered mental status with metabolic encephalopathy due to polypharmacy and electrolyte abnormalities. She was recently started on Trazodone and the dose wasincreased which is what led to AMS. Trazodone has been doiscontinued for now. Her mental status is at baseline as per family member today at beside.  Her MS contin was changed to BID instead of TID as well.  2 hyponatremia-due to dehydration and use of diuretics. Sodium level has Improved with IV fluids. Metolazone has been discontinued and Lasix dose has been lowered.  3 metabolic alkalosis: This is due to increasing use of diuretics. Patient's metolazone stopped for now and she will continue on lower dose Lasix.  Co2 has improved.  4 hypokalemia: Improved with supplementation and decreasing LASIX. Mg level was normal.   5 history of hepatitis C: There were no acute issue.  6  Depression: She will continue Cymbalta and Seroquel.   7 Hypothyroidism: Continue Synthroid.   8. ALS - cont. Neurontin She is on DYSPHAGIA 2 diet with aspiration precautions.  DISCHARGE CONDITIONS AND DIET:   Stable condition DYSPHAGIA 2 diet with aspiration precautions.  CONSULTS OBTAINED:    DRUG ALLERGIES:   Allergies  Allergen Reactions  . Aspirin Other (See Comments)    dizziness  . Hctz [Hydrochlorothiazide] Other (See Comments)    cramping  . Lisinopril Cough    DISCHARGE MEDICATIONS:   Current Discharge Medication List    CONTINUE these medications which have CHANGED   Details  furosemide (LASIX) 80 MG tablet Take 1 tablet (80 mg total) by mouth daily. Qty: 30 tablet, Refills: 0    morphine (MS CONTIN) 15 MG 12 hr tablet Take 1 tablet (15 mg total) by mouth every 12 (twelve) hours. Qty: 60 tablet, Refills: 0    oxyCODONE (OXY IR/ROXICODONE) 5 MG immediate release tablet Take 1 tablet (5 mg total) by mouth every 6 (six) hours as needed for moderate pain or severe pain. Qty: 30 tablet, Refills: 0      CONTINUE these medications which have NOT CHANGED   Details  acetaminophen (TYLENOL) 500 MG tablet Take 1,000 mg by mouth every 6 (six) hours as needed for mild pain or moderate pain. *not to exceed 4 grams/24 hours*    bisacodyl (DULCOLAX) 10 MG suppository Place 10 mg rectally daily as needed for mild constipation, moderate constipation or severe constipation.    bisacodyl (DULCOLAX) 5 MG EC tablet Take 10 mg by mouth daily as needed for mild constipation, moderate constipation or severe constipation.  Calcium 500 MG tablet Take 500 mg by mouth daily.    Docusate Sodium 100 MG capsule Take 100 mg by mouth every 12 (twelve) hours as needed for constipation.    DULoxetine (CYMBALTA) 30 MG capsule Take 90 mg by mouth every morning.    ergocalciferol (VITAMIN D2) 50000 UNITS capsule Take 50,000 Units by mouth once a week.    gabapentin (NEURONTIN) 600 MG  tablet Take 600 mg by mouth 2 (two) times daily.    levothyroxine (SYNTHROID, LEVOTHROID) 75 MCG tablet Take 75 mcg by mouth daily.    omeprazole (PRILOSEC) 20 MG capsule Take 20 mg by mouth 2 (two) times daily before a meal.    Polyethylene Glycol 3350 (MIRALAX PO) Take 17 g by mouth See admin instructions. Give 17 grams orally with 8 oz of water and drink by mouth twice a day. Then use every 12 hours as needed for constipation.    potassium chloride SA (K-DUR,KLOR-CON) 20 MEQ tablet Take 20 mEq by mouth 2 (two) times daily. Take with food.    promethazine (PHENERGAN) 25 MG/ML injection Inject 25 mg into the muscle every 6 (six) hours as needed for nausea or vomiting.    QUEtiapine (SEROQUEL) 25 MG tablet Take 25 mg by mouth 2 (two) times daily.      STOP taking these medications     metolazone (ZAROXOLYN) 2.5 MG tablet      traZODone (DESYREL) 50 MG tablet               Today   CHIEF COMPLAINT:  Patient at baseline this am. She is c/o soreness from heparin injections.   VITAL SIGNS:  Blood pressure 101/62, pulse 84, temperature 98.3 F (36.8 C), temperature source Oral, resp. rate 18, height  (1.651 m), weight 97.614 kg (215 lb 3.2 oz), SpO2 100 %.   REVIEW OF SYSTEMS:  Review of Systems  Constitutional: Negative for fever, chills and malaise/fatigue.  HENT: Negative for sore throat.   Eyes: Negative for blurred vision.  Respiratory: Negative for cough, hemoptysis, shortness of breath and wheezing.   Cardiovascular: Negative for chest pain, palpitations and leg swelling.  Gastrointestinal: Negative for nausea, vomiting, abdominal pain, diarrhea and blood in stool.  Genitourinary: Negative for dysuria and hematuria.  Musculoskeletal: Negative for back pain.  Neurological: Negative for dizziness and headaches.       ALS bed bound  Endo/Heme/Allergies: Does not bruise/bleed easily.     PHYSICAL EXAMINATION:  GENERAL:  59 y.o.-year-old patient lying in  the bed with no acute distress.  NECK:  Supple, no jugular venous distention. No thyroid enlargement, no tenderness.  LUNGS: Normal breath sounds bilaterally, no wheezing, rales,rhonchi  No use of accessory muscles of respiration.  CARDIOVASCULAR: S1, S2 normal. No murmurs, rubs, or gallops.  ABDOMEN: Soft, non-tender, non-distended. Bowel sounds present. No organomegaly or mass. No bruising noted or rebound giarding EXTREMITIES:1+ pedal edema, NO cyanosis, or clubbing.  PSYCHIATRIC: The patient is alert and oriented x 3.  SKIN: No obvious rash, lesion, or ulcer.  Patient is bedbound  DATA REVIEW:   CBC  Recent Labs Lab 11/12/15 1114  WBC 12.1*  HGB 12.0  HCT 35.8  PLT 291    Chemistries   Recent Labs Lab 11/11/15 1325 11/11/15 1605  11/14/15 0404  NA 124*  --   < > 132*  K 2.3*  --   < > 3.3*  CL <65*  --   < > 83*  CO2 47*  --   < >  39*  GLUCOSE 144*  --   < > 84  BUN 30*  --   < > 9  CREATININE 0.57  --   < > 0.43*  CALCIUM 9.2  --   < > 9.2  MG  --  2.3  --   --   AST 104*  --   --   --   ALT 93*  --   --   --   ALKPHOS 100  --   --   --   BILITOT 1.4*  --   --   --   < > = values in this interval not displayed.  Cardiac Enzymes No results for input(s): TROPONINI in the last 168 hours.  Microbiology Results  @  RADIOLOGY:  No results found.    Management plans discussed with the patient and she is in agreement. Stable for discharge   Patient should follow up with PCP in 1 week  CODE STATUS:     Code Status Orders        Start     Ordered   11/11/15 1819  Full code   Continuous     11/11/15 1818      TOTAL TIME TAKING CARE OF THIS PATIENT: 35 minutes.    Note: This dictation was prepared with Dragon dictation along with smaller phrase technology. Any transcriptional errors that result from this process are unintentional.  Jonni Oelkers M.D on 11/14/2015 at 7:05 AM  Between 7am to 6pm - Pager - [phone] After 6pm go to  [email or web address] - password [email or web address] [facility]  [place] [facility]  Office  [phone]  CC: Primary care physician; No PCP Per Patient

## 2015-11-14 NOTE — Progress Notes (Signed)
Called report to [facility].  IV removed and EMS called for transport.  Family aware at bedside.  Orson Ape, RN

## 2015-11-14 NOTE — Plan of Care (Signed)
Problem: Nutrition: Goal: Adequate nutrition will be maintained Outcome: Progressing Pt more alert and oriented. MS contin bid scheduled. No c/o pain nor distress noted. K+ 3.3 this am. SR on monitor. Continues on iv fluids. Significant other at bedside. Continue to monitor.

## 2015-11-14 NOTE — Clinical Social Work Note (Signed)
Pt is ready for discharge today and will return to [facility]. Pt and family are aware. Facility has received discharge information and is ready to accept pt. RN will call report and EMS will provide transportation. CSW is signing off as no further needs identified.   Dede Query, MSW, LCSW Clinical Social Worker [phone]

## 2015-11-14 NOTE — Progress Notes (Signed)
Pt picked up by EMS for transport to [facility].  Orson Ape, RN

## 2015-11-16 LAB — CULTURE, BLOOD (ROUTINE X 2)
CULTURE: NO GROWTH
CULTURE: NO GROWTH

## 2016-11-26 DIAGNOSIS — G1221 Amyotrophic lateral sclerosis: Secondary | ICD-10-CM | POA: Diagnosis not present

## 2016-11-26 DIAGNOSIS — R4702 Dysphasia: Secondary | ICD-10-CM | POA: Diagnosis not present

## 2016-11-26 DIAGNOSIS — Z515 Encounter for palliative care: Secondary | ICD-10-CM

## 2016-11-26 DIAGNOSIS — B182 Chronic viral hepatitis C: Secondary | ICD-10-CM | POA: Diagnosis not present

## 2017-08-21 ENCOUNTER — Other Ambulatory Visit: Payer: Self-pay | Admitting: Gastroenterology

## 2017-08-21 DIAGNOSIS — R1013 Epigastric pain: Secondary | ICD-10-CM

## 2017-08-26 ENCOUNTER — Ambulatory Visit: Payer: Medicare Other

## 2017-09-02 ENCOUNTER — Ambulatory Visit
Admission: RE | Admit: 2017-09-02 | Discharge: 2017-09-02 | Disposition: A | Discharge status: Home / Self Care | Payer: Medicare Other | Source: Ambulatory Visit | Attending: Gastroenterology | Admitting: Gastroenterology

## 2017-09-02 DIAGNOSIS — R1013 Epigastric pain: Secondary | ICD-10-CM | POA: Diagnosis present

## 2017-09-02 DIAGNOSIS — R531 Weakness: Secondary | ICD-10-CM | POA: Insufficient documentation

## 2017-12-29 ENCOUNTER — Encounter: Payer: Self-pay | Admitting: Student

## 2017-12-30 ENCOUNTER — Encounter: Payer: Self-pay | Admitting: *Deleted

## 2017-12-30 ENCOUNTER — Ambulatory Visit: Admitting: Anesthesiology

## 2017-12-30 ENCOUNTER — Ambulatory Visit
Admission: RE | Admit: 2017-12-30 | Discharge: 2017-12-30 | Disposition: A | Discharge status: Home / Self Care | Source: Ambulatory Visit | Attending: Gastroenterology | Admitting: Gastroenterology

## 2017-12-30 ENCOUNTER — Other Ambulatory Visit: Payer: Self-pay

## 2017-12-30 ENCOUNTER — Encounter
Admission: RE | Disposition: A | Discharge status: Home / Self Care | Payer: Self-pay | Source: Ambulatory Visit | Attending: Gastroenterology

## 2017-12-30 DIAGNOSIS — Z79890 Hormone replacement therapy (postmenopausal): Secondary | ICD-10-CM | POA: Diagnosis not present

## 2017-12-30 DIAGNOSIS — K2950 Unspecified chronic gastritis without bleeding: Secondary | ICD-10-CM | POA: Diagnosis not present

## 2017-12-30 DIAGNOSIS — Z888 Allergy status to other drugs, medicaments and biological substances status: Secondary | ICD-10-CM | POA: Insufficient documentation

## 2017-12-30 DIAGNOSIS — Z9889 Other specified postprocedural states: Secondary | ICD-10-CM | POA: Diagnosis not present

## 2017-12-30 DIAGNOSIS — R933 Abnormal findings on diagnostic imaging of other parts of digestive tract: Secondary | ICD-10-CM | POA: Insufficient documentation

## 2017-12-30 DIAGNOSIS — Z79891 Long term (current) use of opiate analgesic: Secondary | ICD-10-CM | POA: Insufficient documentation

## 2017-12-30 DIAGNOSIS — F329 Major depressive disorder, single episode, unspecified: Secondary | ICD-10-CM | POA: Diagnosis not present

## 2017-12-30 DIAGNOSIS — Z79899 Other long term (current) drug therapy: Secondary | ICD-10-CM | POA: Diagnosis not present

## 2017-12-30 DIAGNOSIS — E079 Disorder of thyroid, unspecified: Secondary | ICD-10-CM | POA: Diagnosis not present

## 2017-12-30 DIAGNOSIS — K317 Polyp of stomach and duodenum: Secondary | ICD-10-CM | POA: Diagnosis not present

## 2017-12-30 DIAGNOSIS — R1110 Vomiting, unspecified: Secondary | ICD-10-CM | POA: Diagnosis present

## 2017-12-30 DIAGNOSIS — I10 Essential (primary) hypertension: Secondary | ICD-10-CM | POA: Diagnosis not present

## 2017-12-30 DIAGNOSIS — F419 Anxiety disorder, unspecified: Secondary | ICD-10-CM | POA: Insufficient documentation

## 2017-12-30 DIAGNOSIS — G8929 Other chronic pain: Secondary | ICD-10-CM | POA: Diagnosis not present

## 2017-12-30 DIAGNOSIS — K449 Diaphragmatic hernia without obstruction or gangrene: Secondary | ICD-10-CM | POA: Insufficient documentation

## 2017-12-30 DIAGNOSIS — K228 Other specified diseases of esophagus: Secondary | ICD-10-CM | POA: Diagnosis not present

## 2017-12-30 DIAGNOSIS — Z8619 Personal history of other infectious and parasitic diseases: Secondary | ICD-10-CM | POA: Diagnosis not present

## 2017-12-30 DIAGNOSIS — Z886 Allergy status to analgesic agent status: Secondary | ICD-10-CM | POA: Diagnosis not present

## 2017-12-30 DIAGNOSIS — K224 Dyskinesia of esophagus: Secondary | ICD-10-CM | POA: Insufficient documentation

## 2017-12-30 DIAGNOSIS — G1221 Amyotrophic lateral sclerosis: Secondary | ICD-10-CM | POA: Insufficient documentation

## 2017-12-30 DIAGNOSIS — E785 Hyperlipidemia, unspecified: Secondary | ICD-10-CM | POA: Diagnosis not present

## 2017-12-30 HISTORY — DX: Unspecified psychosis not due to a substance or known physiological condition: F29

## 2017-12-30 HISTORY — PX: ESOPHAGOGASTRODUODENOSCOPY (EGD) WITH PROPOFOL: SHX5813

## 2017-12-30 HISTORY — DX: Hyperlipidemia, unspecified: E78.5

## 2017-12-30 HISTORY — DX: Amyotrophic lateral sclerosis: G12.21

## 2017-12-30 HISTORY — DX: Ataxia, unspecified: R27.0

## 2017-12-30 HISTORY — DX: Other chronic pain: G89.29

## 2017-12-30 SURGERY — ESOPHAGOGASTRODUODENOSCOPY (EGD) WITH PROPOFOL
Anesthesia: General

## 2017-12-30 MED ORDER — SODIUM CHLORIDE 0.9 % IV SOLN
INTRAVENOUS | Status: DC
  Administered 2017-12-30: 10:00:00 via INTRAVENOUS

## 2017-12-30 MED ORDER — IPRATROPIUM-ALBUTEROL 0.5-2.5 (3) MG/3ML IN SOLN
RESPIRATORY_TRACT | Status: AC
  Administered 2017-12-30: 3 mL via RESPIRATORY_TRACT
  Filled 2017-12-30: qty 3

## 2017-12-30 MED ORDER — IPRATROPIUM-ALBUTEROL 0.5-2.5 (3) MG/3ML IN SOLN
3.0000 mL | Freq: Once | RESPIRATORY_TRACT | Status: AC
  Administered 2017-12-30: 3 mL via RESPIRATORY_TRACT

## 2017-12-30 MED ORDER — PROPOFOL 500 MG/50ML IV EMUL
INTRAVENOUS | Status: DC | PRN
  Administered 2017-12-30: 75 ug/kg/min via INTRAVENOUS

## 2017-12-30 MED ORDER — LIDOCAINE HCL (PF) 2 % IJ SOLN
INTRAMUSCULAR | Status: AC
  Filled 2017-12-30: qty 10

## 2017-12-30 MED ORDER — LIDOCAINE HCL (PF) 2 % IJ SOLN
INTRAMUSCULAR | Status: DC | PRN
  Administered 2017-12-30: 100 mg via INTRADERMAL

## 2017-12-30 MED ORDER — PROPOFOL 10 MG/ML IV BOLUS
INTRAVENOUS | Status: DC | PRN
  Administered 2017-12-30: 10 mg via INTRAVENOUS
  Administered 2017-12-30: 40 mg via INTRAVENOUS

## 2017-12-30 MED ORDER — SODIUM CHLORIDE 0.9 % IV SOLN: INTRAVENOUS | Status: DC

## 2017-12-30 MED ORDER — DEXAMETHASONE SODIUM PHOSPHATE 4 MG/ML IJ SOLN
INTRAMUSCULAR | Status: DC | PRN
  Administered 2017-12-30: 10 mg via INTRAVENOUS

## 2017-12-30 NOTE — Transfer of Care (Signed)
Immediate Anesthesia Transfer of Care Note  Patient: Cynthia Landry  Procedure(s) Performed: ESOPHAGOGASTRODUODENOSCOPY (EGD) WITH PROPOFOL (N/A )  Patient Location: PACU  Anesthesia Type:General  Level of Consciousness: sedated  Airway & Oxygen Therapy: Patient Spontanous Breathing and Patient connected to nasal cannula oxygen  Post-op Assessment: Report given to RN and Post -op Vital signs reviewed and stable  Post vital signs: Reviewed and stable  Last Vitals:  Vitals:   12/30/17 0847  BP: 120/69  Pulse: 95  Resp: 20  Temp: (!) 34.7 C  SpO2: 98%    Last Pain:  Vitals:   12/30/17 0847  TempSrc: Tympanic      Patients Stated Pain Goal: 10 (12/30/17 0847)  Complications: No apparent anesthesia complications

## 2017-12-30 NOTE — Anesthesia Post-op Follow-up Note (Signed)
Anesthesia QCDR form completed.        

## 2017-12-30 NOTE — Anesthesia Postprocedure Evaluation (Signed)
Anesthesia Post Note  Patient: Cynthia Landry  Procedure(s) Performed: ESOPHAGOGASTRODUODENOSCOPY (EGD) WITH PROPOFOL (N/A )  Patient location during evaluation: Endoscopy Anesthesia Type: General Level of consciousness: awake and alert Pain management: pain level controlled Vital Signs Assessment: post-procedure vital signs reviewed and stable Respiratory status: spontaneous breathing, nonlabored ventilation, respiratory function stable and patient connected to nasal cannula oxygen Cardiovascular status: blood pressure returned to baseline and stable Postop Assessment: no apparent nausea or vomiting Anesthetic complications: no     Last Vitals:  Vitals:   12/30/17 1030 12/30/17 1050  BP: 122/78 126/86  Pulse: 95 (!) 103  Resp: 14 19  Temp:    SpO2: 99% 100%    Last Pain:  Vitals:   12/30/17 1020  TempSrc: Tympanic                 Cleda Mccreedy Piscitello

## 2017-12-30 NOTE — Op Note (Signed)
[facility] Patient Name: Cynthia Landry Procedure Date: 12/30/2017 9:17 AM MRN: [id number] Account #: [id number] Date of Birth: 1956-09-22 Admit Type: Outpatient Age: 62 Room: [facility] ENDO ROOM 3 Gender: Female Note Status: Finalized Procedure:            Upper GI endoscopy Indications:          Abnormal UGI series, Vomiting Providers:            Christena Deem, MD Referring MD:         No Local Md, MD (Referring MD) Medicines:            Monitored Anesthesia Care Complications:        No immediate complications. Procedure:            Pre-Anesthesia Assessment:                       - ASA Grade Assessment: III - A patient with severe                        systemic disease.                       After obtaining informed consent, the endoscope was                        passed under direct vision. Throughout the procedure,                        the patient's blood pressure, pulse, and oxygen                        saturations were monitored continuously. The Endoscope                        was introduced through the mouth, and advanced to the                        third part of duodenum. The upper GI endoscopy was                        accomplished without difficulty. The patient tolerated                        the procedure well. Findings:      The lumen of the middle third of the esophagus and lower third of the       esophagus was moderately dilated.      There is no evidence of stenosis or stricture, however there is a sharp       angulation before entering the stomach, this showing a shelf-like       projection just above the GE junction.      The Z-line was variable. Biopsies were taken with a cold forceps for       histology.      Abnormal motility was noted in the middle third of the esophagus and in       the lower third of the esophagus. There is a decrease in motility of the       esophageal body.      A single 6 mm sessile  polyp with no bleeding and no stigmata of recent  bleeding was found on the posterior wall of the gastric antrum. Biopsies       were taken with a cold forceps for histology.      Diffuse mild inflammation characterized by erythema and granularity was       found in the gastric body. Biopsies were taken with a cold forceps for       histology.      The examined duodenum was normal.      A small hiatal hernia was present. Impression:           - Dilation in the middle third of the esophagus and in                        the lower third of the esophagus.                       - Z-line variable. Biopsied.                       - Abnormal esophageal motility, suspicious for                        aperistalsis.                       - A single gastric polyp. Biopsied.                       - Gastritis. Biopsied.                       - Normal examined duodenum. Recommendation:       - Discharge patient to home.                       - Await pathology results.                       - Perform routine esophageal manometry at appointment                        to be scheduled.                       - Await pathology results. Procedure Code(s):    --- Professional ---                       [phone], Esophagogastroduodenoscopy, flexible, transoral;                        with biopsy, single or multiple Diagnosis Code(s):    --- Professional ---                       K22.8, Other specified diseases of esophagus                       K22.4, Dyskinesia of esophagus                       K31.7, Polyp of stomach and duodenum                       K29.70, Gastritis, unspecified, without bleeding  R11.10, Vomiting, unspecified                       R93.3, Abnormal findings on diagnostic imaging of other                        parts of digestive tract CPT copyright 2016 American [employer] Association. All rights reserved. The codes documented in this report are preliminary and upon  coder review may  be revised to meet current compliance requirements. Christena Deem, MD 12/30/2017 10:33:38 AM This report has been signed electronically. Number of Addenda: 0 Note Initiated On: 12/30/2017 9:17 AM      [facility]

## 2017-12-30 NOTE — Anesthesia Preprocedure Evaluation (Signed)
Anesthesia Evaluation  Patient identified by MRN, date of birth, ID band Patient awake    Reviewed: Allergy & Precautions, H&P , NPO status , Patient's Chart, lab work & pertinent test results  History of Anesthesia Complications Negative for: history of anesthetic complications  Airway Mallampati: III  TM Distance: <3 FB Neck ROM: limited    Dental  (+) Chipped, Poor Dentition, Missing   Pulmonary neg pulmonary ROS, neg shortness of breath,           Cardiovascular Exercise Tolerance: Good hypertension, (-) angina(-) Past MI and (-) DOE      Neuro/Psych PSYCHIATRIC DISORDERS Anxiety Depression Schizophrenia negative neurological ROS     GI/Hepatic negative GI ROS, (+) Hepatitis -, C  Endo/Other  negative endocrine ROS  Renal/GU negative Renal ROS  negative genitourinary   Musculoskeletal   Abdominal   Peds  Hematology negative hematology ROS (+)   Anesthesia Other Findings Patient is NPO appropriate and reports no nausea or vomiting today.  Past Medical History: No date: ALS (amyotrophic lateral sclerosis) (HCC) No date: Amyotrophic lateral sclerosis (HCC) No date: Anxiety No date: Ataxia No date: Chronic pain No date: Depression No date: Hepatitis C No date: Hyperlipidemia No date: Hypertension No date: Psychosis (HCC) No date: Thyroid disease  Past Surgical History: No date: COLONOSCOPY No date: SHOULDER SURGERY  BMI    Body Mass Index:  34.20 kg/m      Reproductive/Obstetrics negative OB ROS                             Anesthesia Physical Anesthesia Plan  ASA: III  Anesthesia Plan: General   Post-op Pain Management:    Induction: Intravenous  PONV Risk Score and Plan: Propofol infusion and TIVA  Airway Management Planned: Natural Airway and Nasal Cannula  Additional Equipment:   Intra-op Plan:   Post-operative Plan:   Informed Consent: I have reviewed  the patients History and Physical, chart, labs and discussed the procedure including the risks, benefits and alternatives for the proposed anesthesia with the patient or authorized representative who has indicated his/her understanding and acceptance.   Dental Advisory Given  Plan Discussed with: Anesthesiologist, CRNA and Surgeon  Anesthesia Plan Comments: (Patient consented for risks of anesthesia including but not limited to:  - adverse reactions to medications - risk of intubation if required - damage to teeth, lips or other oral mucosa - sore throat or hoarseness - Damage to heart, brain, lungs or loss of life  Patient voiced understanding.)        Anesthesia Quick Evaluation

## 2017-12-30 NOTE — H&P (Signed)
Outpatient short stay form Pre-procedure 12/30/2017 9:29 AM Cynthia Deem MD  Primary Physician: Dr. Yetta Flock, Dr. Drue Flirt MD, Dr. Zannie Kehr  Reason for visit: EGD  History of present illness: Patient is a 62 year old female with a known history of ALS.  She is having problems with fluid retention in the esophagus and then emesis.  Has had a barium swallow that indicated no stenosis however the esophagus itself appears to be generous in caliber.  Motility seems to be poor on those study pictures.  sHe will choke on foods in the cervical region at times.  It is of note that her speech is affected by her ALS and he also has ataxia.  She takes no blood thinners or aspirin product.  She has not yet had a esophageal manometry.    Current Facility-Administered Medications:  .  0.9 %  sodium chloride infusion, , Intravenous, Continuous, Cynthia Deem, MD .  0.9 %  sodium chloride infusion, , Intravenous, Continuous, Cynthia Deem, MD  Medications Prior to Admission  Medication Sig Dispense Refill Last Dose  . acetaminophen (TYLENOL) 500 MG tablet Take 1,000 mg by mouth every 6 (six) hours as needed for mild pain or moderate pain. *not to exceed 4 grams/24 hours*   Past Month at Unknown time  . atorvastatin (LIPITOR) 20 MG tablet Take 20 mg by mouth daily.   12/29/2017 at 1800  . bisacodyl (DULCOLAX) 10 MG suppository Place 10 mg rectally daily as needed for mild constipation, moderate constipation or severe constipation.   12/29/2017 at 1800  . brinzolamide (AZOPT) 1 % ophthalmic suspension 1 drop 3 (three) times daily.   12/30/2017 at 0700  . Calcium 500 MG tablet Take 500 mg by mouth daily.   12/29/2017 at 1800  . calcium carbonate (TUMS - DOSED IN MG ELEMENTAL CALCIUM) 500 MG chewable tablet Chew 1 tablet by mouth daily.     [name] dextromethorphan-guaiFENesin (ROBITUSSIN-DM) 10-100 MG/5ML liquid Take by mouth every 4 (four) hours as needed for cough.   12/29/2017 at 1800  . Docusate  Sodium 100 MG capsule Take 100 mg by mouth every 12 (twelve) hours as needed for constipation.   12/29/2017 at 1800  . DULoxetine (CYMBALTA) 30 MG capsule Take 90 mg by mouth every morning.   12/30/2017 at 0600  . ergocalciferol (VITAMIN D2) 50000 UNITS capsule Take 50,000 Units by mouth once a week.   12/29/2017 at 1800  . furosemide (LASIX) 80 MG tablet Take 1 tablet (80 mg total) by mouth daily. 30 tablet 0 12/29/2017 at 0600  . gabapentin (NEURONTIN) 600 MG tablet Take 600 mg by mouth 2 (two) times daily.   12/29/2017 at 1800  . guaiFENesin (ROBITUSSIN) 100 MG/5ML liquid Take 200 mg by mouth 3 (three) times daily as needed for cough.   Past Week at Unknown time  . Ipratropium-Albuterol (COMBIVENT) 20-100 MCG/ACT AERS respimat Inhale 1 puff into the lungs every 6 (six) hours.   12/30/2017 at Unknown time  . levothyroxine (SYNTHROID, LEVOTHROID) 75 MCG tablet Take 75 mcg by mouth daily.   12/30/2017 at 0600  . magnesium hydroxide (MILK OF MAGNESIA) 400 MG/5ML suspension Take by mouth daily as needed for mild constipation.   Past Week at Unknown time  . morphine (MS CONTIN) 15 MG 12 hr tablet Take 1 tablet (15 mg total) by mouth every 12 (twelve) hours. 60 tablet 0 Past Week at Unknown time  . omeprazole (PRILOSEC) 20 MG capsule Take 20 mg by mouth 2 (two) times  daily before a meal.   12/30/2017 at 0600  . Polyethylene Glycol 3350 (MIRALAX PO) Take 17 g by mouth See admin instructions. Give 17 grams orally with 8 oz of water and drink by mouth twice a day. Then use every 12 hours as needed for constipation.   Past Week at Unknown time  . potassium chloride SA (K-DUR,KLOR-CON) 20 MEQ tablet Take 20 mEq by mouth 2 (two) times daily. Take with food.   12/29/2017 at 0600  . promethazine (PHENERGAN) 25 MG/ML injection Inject 25 mg into the muscle every 6 (six) hours as needed for nausea or vomiting.   Past Week at Unknown time  . QUEtiapine (SEROQUEL) 25 MG tablet Take 25 mg by mouth 2 (two) times daily.    12/29/2017 at 2000  . bisacodyl (DULCOLAX) 5 MG EC tablet Take 10 mg by mouth daily as needed for mild constipation, moderate constipation or severe constipation.   unknown  . oxyCODONE (OXY IR/ROXICODONE) 5 MG immediate release tablet Take 1 tablet (5 mg total) by mouth every 6 (six) hours as needed for moderate pain or severe pain. 30 tablet 0      Allergies  Allergen Reactions  . Aspirin Other (See Comments)    dizziness  . Hctz [Hydrochlorothiazide] Other (See Comments)    cramping  . Lisinopril Cough     Past Medical History:  Diagnosis Date  . ALS (amyotrophic lateral sclerosis) (HCC)   . Amyotrophic lateral sclerosis (HCC)   . Anxiety   . Ataxia   . Chronic pain   . Depression   . Hepatitis C   . Hyperlipidemia   . Hypertension   . Psychosis (HCC)   . Thyroid disease     Review of systems:      Physical Exam    Heart and lungs: Regular rate and rhythm without rub or gallop, lungs are bilaterally clear.    HEENT: Normocephalic atraumatic eyes are anicteric    Other:    Pertinant exam for procedure: Protuberant/obese firm to palpation unable to palpate internal organs.  She is nontender and bowel sounds are positive.    Planned proceedures: EGD and indicated procedures. I have discussed the risks benefits and complications of procedures to include not limited to bleeding, infection, perforation and the risk of sedation and the patient wishes to proceed.    Cynthia Deem, MD Gastroenterology 12/30/2017  9:29 AM

## 2017-12-30 NOTE — Anesthesia Procedure Notes (Signed)
Date/Time: 12/30/2017 10:16 AM Performed by: Junious Silk, CRNA Pre-anesthesia Checklist: Patient identified, Emergency Drugs available, Suction available, Patient being monitored and Timeout performed Oxygen Delivery Method: Simple face mask

## 2017-12-31 ENCOUNTER — Encounter: Payer: Self-pay | Admitting: Gastroenterology

## 2018-01-02 LAB — SURGICAL PATHOLOGY

## 2018-12-30 ENCOUNTER — Non-Acute Institutional Stay: Payer: Medicare Other | Admitting: Nurse Practitioner

## 2018-12-30 ENCOUNTER — Encounter: Payer: Self-pay | Admitting: Nurse Practitioner

## 2018-12-30 VITALS — BP 118/80 | HR 85 | Temp 96.7°F | Resp 18 | Wt 175.0 lb

## 2018-12-30 DIAGNOSIS — Z515 Encounter for palliative care: Secondary | ICD-10-CM

## 2018-12-30 DIAGNOSIS — R5381 Other malaise: Secondary | ICD-10-CM | POA: Insufficient documentation

## 2018-12-30 DIAGNOSIS — R1310 Dysphagia, unspecified: Secondary | ICD-10-CM | POA: Insufficient documentation

## 2018-12-30 NOTE — Progress Notes (Signed)
Therapist, nutritional Palliative Care Consult Note Telephone: [phone]  Fax: [phone]  PATIENT NAME: Cynthia Landry DOB: [birth year] MRN: [id number]  PRIMARY CARE PROVIDER:   Dr Cynthia Landry PROVIDER:  Dr Cynthia Landry RESPONSIBLE PARTY:   Cynthia Landry daughter Health Care power-of-attorney [phone]  RECOMMENDATIONS and PLAN:  1. Palliative care encounter Z51.5; Palliative medicine team will continue to support patient, patient's family, and medical team. Visit consisted of counseling and education dealing with the complex and emotionally intense issues of symptom management and palliative care in the setting of serious and potentially life-threatening illness  2. Dysphagia R13.10; secondary to ALS. Continue to monitor weights, appetite, aspiration precautions.   3. Debility R53.81 secondary to ALS, encourage passive rom; encourage to transfer to geri-chair.   ASSESSMENT:     I visited and observed Cynthia Landry. We talked about purpose her palliative care visit and she was an agreement we talked about how she was feeling today. We talked about symptoms of pain but she denies. We talked about shortness of breath and she was comfortable. We talked about using her BiPAP which she continues to use. We talked about swallowing and she shared that it is been the same. Cynthia Landry endorses no further decline. She talked about managing her secretions. We talked about swallowing and eating for what she is not been having more difficulty with. We talked about coughing what she does receive Robitussin for intermittently. We talked about residing it's still long-term care facility. We talked about loss of Independence. We talked about challenges relying on others four basic needs. We talked about coping strategies. She replies that she is doing fine and she enjoys psychotherapy. She talked about her daughter and grandchildren. We talked about coping strategies.  We talked about role of palliative care and plan of care. Medical goals with focus on Comfort with DNR remaining in place. We talked about role of palliative care and plan of care. Discuss that will follow up in two months if needed or sooner should she declined. I have updated nursing staff Cynthia Landry endorses she is very thankful for palliative care visit today.  BMI 32 11 / 6 / 2019 weight 172.5 lbs 1 / 3 / 2020 weight 170.5 lbs 2 / 4 / 2020 weight 175.0 lbs  12 / 24 / 2019 sodium 140, potassium 3.6, chloride 96, Co2 27, calcium 9.7, bun 10.4, Creatinine 0.30, glucose 146, albumin 3.9, total protein 8.0, hemoglobin A1c 6.2  Palliative care 5 / 7 / 2019 Palliative care 11 / 21 / 2017 to 11 / 9 / 2018 Hospice 11 / 11 / 2018 to 5 / 1 / 2019  I spent 60 minutes providing this consultation,  from 1:00pm to 2:00pm. More than 50% of the time in this consultation was spent coordinating communication.   HISTORY OF PRESENT ILLNESS:  Cynthia Landry is a 63 y.o. year old female with multiple medical problems including ALS, dysphasia, hepatitis C, left humerus pathological fractures, muscle weakness. She continues to reside at skilled long-term care facility bed-bound, total ADL dependence and requires to be turned. She is incontinent of bowel and bladder. She does need to be fed. No recent Falls, wounds, hospitalizations, infections. She previously was under [facility] though discharge due to stability. She is followed by Psychiatry with last date of service 1 / 65 / 2020 for mood disorder in the setting of ALS which she is prescribed Cymbalta for mood, melatonin for Sleep, Gabapentin  for pain and Flexeril for spasms. No medication changes at this visit. She is also followed by psychotherapy. At present she is lying in bed. She has no complaints or concerns, appears comfortable. No visitors present.. Palliative Care was asked to help address goals of care.   CODE STATUS: DNR  PPS: 30% HOSPICE  ELIGIBILITY/DIAGNOSIS: TBD  PAST MEDICAL HISTORY:  Past Medical History:  Diagnosis Date  . ALS (amyotrophic lateral sclerosis) (HCC)   . Amyotrophic lateral sclerosis (HCC)   . Anxiety   . Ataxia   . Chronic pain   . Depression   . Hepatitis C   . Hyperlipidemia   . Hypertension   . Psychosis (HCC)   . Thyroid disease     SOCIAL HX:  Social History   Tobacco Use  . Smoking status: Never Smoker  . Smokeless tobacco: Never Used  Substance Use Topics  . Alcohol use: No    ALLERGIES:  Allergies  Allergen Reactions  . Aspirin Other (See Comments)    dizziness  . Hctz [Hydrochlorothiazide] Other (See Comments)    cramping  . Lisinopril Cough     PERTINENT MEDICATIONS:  Outpatient Encounter Medications as of 12/30/2018  Medication Sig  . acetaminophen (TYLENOL) 500 MG tablet Take 1,000 mg by mouth every 6 (six) hours as needed for mild pain or moderate pain. *not to exceed 4 grams/24 hours*  . atorvastatin (LIPITOR) 20 MG tablet Take 20 mg by mouth daily.  . bisacodyl (DULCOLAX) 10 MG suppository Place 10 mg rectally daily as needed for mild constipation, moderate constipation or severe constipation.  . bisacodyl (DULCOLAX) 5 MG EC tablet Take 10 mg by mouth daily as needed for mild constipation, moderate constipation or severe constipation.  . brinzolamide (AZOPT) 1 % ophthalmic suspension 1 drop 3 (three) times daily.  . Calcium 500 MG tablet Take 500 mg by mouth daily.  . calcium carbonate (TUMS - DOSED IN MG ELEMENTAL CALCIUM) 500 MG chewable tablet Chew 1 tablet by mouth daily.  [name] dextromethorphan-guaiFENesin (ROBITUSSIN-DM) 10-100 MG/5ML liquid Take by mouth every 4 (four) hours as needed for cough.  Tery Sanfilippo Sodium 100 MG capsule Take 100 mg by mouth every 12 (twelve) hours as needed for constipation.  . DULoxetine (CYMBALTA) 30 MG capsule Take 90 mg by mouth every morning.  . ergocalciferol (VITAMIN D2) 50000 UNITS capsule Take 50,000 Units by mouth once a week.    . furosemide (LASIX) 80 MG tablet Take 1 tablet (80 mg total) by mouth daily.  [name] gabapentin (NEURONTIN) 600 MG tablet Take 600 mg by mouth 2 (two) times daily.  [name] guaiFENesin (ROBITUSSIN) 100 MG/5ML liquid Take 200 mg by mouth 3 (three) times daily as needed for cough.  . Ipratropium-Albuterol (COMBIVENT) 20-100 MCG/ACT AERS respimat Inhale 1 puff into the lungs every 6 (six) hours.  [name] levothyroxine (SYNTHROID, LEVOTHROID) 75 MCG tablet Take 75 mcg by mouth daily.  . magnesium hydroxide (MILK OF MAGNESIA) 400 MG/5ML suspension Take by mouth daily as needed for mild constipation.  [name] morphine (MS CONTIN) 15 MG 12 hr tablet Take 1 tablet (15 mg total) by mouth every 12 (twelve) hours.  [name] omeprazole (PRILOSEC) 20 MG capsule Take 20 mg by mouth 2 (two) times daily before a meal.  . oxyCODONE (OXY IR/ROXICODONE) 5 MG immediate release tablet Take 1 tablet (5 mg total) by mouth every 6 (six) hours as needed for moderate pain or severe pain.  . Polyethylene Glycol 3350 (MIRALAX PO) Take 17 g by mouth See  admin instructions. Give 17 grams orally with 8 oz of water and drink by mouth twice a day. Then use every 12 hours as needed for constipation.  . potassium chloride SA (K-DUR,KLOR-CON) 20 MEQ tablet Take 20 mEq by mouth 2 (two) times daily. Take with food.  . promethazine (PHENERGAN) 25 MG/ML injection Inject 25 mg into the muscle every 6 (six) hours as needed for nausea or vomiting.  [name] QUEtiapine (SEROQUEL) 25 MG tablet Take 25 mg by mouth 2 (two) times daily.   No facility-administered encounter medications on file as of 12/30/2018.     PHYSICAL EXAM:   General: obese, chronically ill, debilitated pleasant female Cardiovascular: regular rate and rhythm Pulmonary: clear ant fields Abdomen: soft, nontender, + bowel sounds GU: no suprapubic tenderness Extremities: no edema, no joint deformities Skin: no rashes Neurological: Weakness but otherwise nonfocal/functional quadriplegic  Maximilien Hayashi [place], NP

## 2019-02-24 ENCOUNTER — Non-Acute Institutional Stay: Payer: Medicare Other | Admitting: Nurse Practitioner

## 2019-02-24 ENCOUNTER — Encounter: Payer: Self-pay | Admitting: Nurse Practitioner

## 2019-02-24 VITALS — BP 127/87 | HR 85 | Temp 98.1°F | Resp 18 | Ht 62.0 in | Wt 173.5 lb

## 2019-02-24 DIAGNOSIS — Z515 Encounter for palliative care: Secondary | ICD-10-CM

## 2019-02-24 DIAGNOSIS — R1310 Dysphagia, unspecified: Secondary | ICD-10-CM

## 2019-02-24 DIAGNOSIS — R5381 Other malaise: Secondary | ICD-10-CM | POA: Insufficient documentation

## 2019-02-24 NOTE — Progress Notes (Signed)
[facility], [facility] Consult Note Telephone: [phone]  Fax: [phone]  PATIENT NAME: Cynthia Landry Advani DOB: 02-Jul-1956 MRN: [id number]  PRIMARY CARE PROVIDER:   Dr Kathleen LimeHodges REFERRING PROVIDER:  Dr Hodges/[facility] RESPONSIBLE PARTY:   Carlynn PurlLatisha Poole daughter Health Care power-of-attorney [phone]  RECOMMENDATIONS and PLAN:  1. Palliative care encounter Z51.5; Palliative medicine team will continue to support patient, patient's family, and medical team. Visit consisted of counseling and education dealing with the complex and emotionally intense issues of symptom management and palliative care in the setting of serious and potentially life-threatening illness  2. Dysphagia R13.10; secondary to ALS. Continue to monitor weights, appetite, aspiration precautions.   3. Debility R53.81 secondary to ALS, encourage passive rom; encourage to transfer to geri-chair.   ASSESSMENT:     I visited and observe [name]. We talked about purpose of palliative care visit and [name] in agreement.  We talked about how she was feeling today and she replies that she's doing okay. We talked about challenges of isolation and limited visitor secondary to covid-19 crisis epidemic. We talked about coping strategies. She talked about missing her daughter. We talked about residing in Skilled Nursing Facility with barriers and challenges. Continue to encourage her to have open communication with staff. We talked about medical goals with focus on Comfort. She denied symptoms of pain or shortness of breath. We did talk about her dysphasia with worsening at times. We talked about her diet and she wishes to remain on comfort feedings. We talked about at present she does continue to remain stable and with worsening of dysphasia and weight loss could revisit hospice though at present time will continue with current plan of care and palliative. Discuss will follow up in  one month if needed or sooner should she declined. Ms Carolin GuernseyKeel an agreement. Therapeutic listening and emotional support provided. Questions answered satisfaction. I did attempt to contact her daughter, unable to reach will continue for update there no changes to current goals or plan of care. I have dated nursing staff.  12 / 24 / 2019 sodium 140, potassium 3.6, chloride 96, Co2 27, calcium 9.7, bun 10.4, creatinine 0.30, glucose 146, total protein 8.0, albumin 3.9, hemoglobin A1c 6.2  BMI 31.7 1 / 3 / 2,020 weight 170.5 lbs 2 / 4 / 2020 weight 175.0 lbs 3 / 11 / 2020 weight 173.5 lbs  Palliative care 5 / 7 / 2019 Palliative care 11 / 21 / 2017 to 11 / 9 / 2018 Hospice 11 / 11 / 2018 to 5 / 1 / 2019  I spent 45 minutes providing this consultation,  from 12:30pm to 1:15pm. More than 50% of the time in this consultation was spent coordinating communication.   HISTORY OF PRESENT ILLNESS:  Cynthia Landry Furches is Landry 63 y.o. year old female with multiple medical problems including ALS, dysphasia, hepatitis C, left humerus pathological fractures, muscle weakness. [name] continues to reside in skilled Long-Term Care Nursing Facility. She remains total bed-bound, ATL dependents with incontinence bowel and bladder. She does required to be fed as she is paraplegic secondary to ALS. She does continue to have difficulty with dysphasia an appetite remain stable. No recent weight loss. [facility] due to stability as weight had been maintained. No recent ones, falls, hospitalizations come infections. She does continue to wear her CPAP at night but at times will refuse. She is able to verbalize her needs.  Medical goals to focus on comfort and DNR in place. She is followed by Psychiatry at the facility with last date of service 3 / 26 / 2,020 for mood disorder in the setting of ALS. She is receiving Cymbalta for mood and benefit of in addition to gabapentin and Flexeril for  spasms. She does take melatonin for sleep. No medication changes at this visit. Last primary provider visit 3 / 19 / 2020 for dysphasia which is ongoing secondary to ALS. Speech therapist with dietitian recommended puree with mechanical altered diet though she did refuse she did not want that. Wishes are for Comfort feeding. At present she is lying in bed. She appears comfortable. No visitors present. Palliative Care was asked to help address goals of care.   CODE STATUS: DNR  PPS: 30% HOSPICE ELIGIBILITY/DIAGNOSIS: TBD  PAST MEDICAL HISTORY:  Past Medical History:  Diagnosis Date  . ALS (amyotrophic lateral sclerosis) (HCC)   . Amyotrophic lateral sclerosis (HCC)   . Anxiety   . Ataxia   . Chronic pain   . Depression   . Hepatitis C   . Hyperlipidemia   . Hypertension   . Psychosis (HCC)   . Thyroid disease     SOCIAL HX:  Social History   Tobacco Use  . Smoking status: Never Smoker  . Smokeless tobacco: Never Used  Substance Use Topics  . Alcohol use: No    ALLERGIES:  Allergies  Allergen Reactions  . Aspirin Other (See Comments)    dizziness  . Hctz [Hydrochlorothiazide] Other (See Comments)    cramping  . Lisinopril Cough     PERTINENT MEDICATIONS:  Outpatient Encounter Medications as of 02/24/2019  Medication Sig  . acetaminophen (TYLENOL) 500 MG tablet Take 1,000 mg by mouth every 6 (six) hours as needed for mild pain or moderate pain. *not to exceed 4 grams/24 hours*  . atorvastatin (LIPITOR) 20 MG tablet Take 20 mg by mouth daily.  . bisacodyl (DULCOLAX) 10 MG suppository Place 10 mg rectally daily as needed for mild constipation, moderate constipation or severe constipation.  . bisacodyl (DULCOLAX) 5 MG EC tablet Take 10 mg by mouth daily as needed for mild constipation, moderate constipation or severe constipation.  . brinzolamide (AZOPT) 1 % ophthalmic suspension 1 drop 3 (three) times daily.  . Calcium 500 MG tablet Take 500 mg by mouth daily.  . calcium  carbonate (TUMS - DOSED IN MG ELEMENTAL CALCIUM) 500 MG chewable tablet Chew 1 tablet by mouth daily.  [name] dextromethorphan-guaiFENesin (ROBITUSSIN-DM) 10-100 MG/5ML liquid Take by mouth every 4 (four) hours as needed for cough.  Tery Sanfilippo Sodium 100 MG capsule Take 100 mg by mouth every 12 (twelve) hours as needed for constipation.  . DULoxetine (CYMBALTA) 30 MG capsule Take 90 mg by mouth every morning.  . ergocalciferol (VITAMIN D2) 50000 UNITS capsule Take 50,000 Units by mouth once Landry week.  . furosemide (LASIX) 80 MG tablet Take 1 tablet (80 mg total) by mouth daily.  [name] gabapentin (NEURONTIN) 600 MG tablet Take 600 mg by mouth 2 (two) times daily.  [name] guaiFENesin (ROBITUSSIN) 100 MG/5ML liquid Take 200 mg by mouth 3 (three) times daily as needed for cough.  . Ipratropium-Albuterol (COMBIVENT) 20-100 MCG/ACT AERS respimat Inhale 1 puff into the lungs every 6 (six) hours.  [name] levothyroxine (SYNTHROID, LEVOTHROID) 75 MCG tablet Take 75 mcg by mouth daily.  . magnesium hydroxide (MILK OF MAGNESIA) 400 MG/5ML suspension Take by mouth daily as needed for mild constipation.  [name]  morphine (MS CONTIN) 15 MG 12 hr tablet Take 1 tablet (15 mg total) by mouth every 12 (twelve) hours.  [name] omeprazole (PRILOSEC) 20 MG capsule Take 20 mg by mouth 2 (two) times daily before Landry meal.  . oxyCODONE (OXY IR/ROXICODONE) 5 MG immediate release tablet Take 1 tablet (5 mg total) by mouth every 6 (six) hours as needed for moderate pain or severe pain.  . Polyethylene Glycol 3350 (MIRALAX PO) Take 17 g by mouth See admin instructions. Give 17 grams orally with 8 oz of water and drink by mouth twice Landry day. Then use every 12 hours as needed for constipation.  . potassium chloride SA (K-DUR,KLOR-CON) 20 MEQ tablet Take 20 mEq by mouth 2 (two) times daily. Take with food.  . promethazine (PHENERGAN) 25 MG/ML injection Inject 25 mg into the muscle every 6 (six) hours as needed for nausea or vomiting.  [name] QUEtiapine (SEROQUEL) 25 MG  tablet Take 25 mg by mouth 2 (two) times daily.   No facility-administered encounter medications on file as of 02/24/2019.     PHYSICAL EXAM:   General: NAD, obese debilitated pleasant female Cardiovascular: regular rate and rhythm Pulmonary: clear ant fields Abdomen: soft, nontender, + bowel sounds GU: no suprapubic tenderness Extremities: +BLE edema, no joint deformities Skin: no rashes Neurological: quadriplegic  Beyza Bellino [place], NP

## 2019-02-25 ENCOUNTER — Other Ambulatory Visit: Payer: Self-pay

## 2019-04-17 ENCOUNTER — Other Ambulatory Visit: Payer: Self-pay

## 2019-04-17 ENCOUNTER — Emergency Department
Admission: EM | Admit: 2019-04-17 | Discharge: 2019-04-20 | Disposition: A | Discharge status: Home / Self Care | Payer: No Typology Code available for payment source | Attending: Emergency Medicine | Admitting: Emergency Medicine

## 2019-04-17 ENCOUNTER — Emergency Department: Payer: No Typology Code available for payment source

## 2019-04-17 ENCOUNTER — Encounter: Payer: Self-pay | Admitting: Emergency Medicine

## 2019-04-17 DIAGNOSIS — S00511A Abrasion of lip, initial encounter: Secondary | ICD-10-CM | POA: Diagnosis not present

## 2019-04-17 DIAGNOSIS — F419 Anxiety disorder, unspecified: Secondary | ICD-10-CM | POA: Diagnosis not present

## 2019-04-17 DIAGNOSIS — S01111A Laceration without foreign body of right eyelid and periocular area, initial encounter: Secondary | ICD-10-CM | POA: Insufficient documentation

## 2019-04-17 DIAGNOSIS — I10 Essential (primary) hypertension: Secondary | ICD-10-CM | POA: Diagnosis not present

## 2019-04-17 DIAGNOSIS — F329 Major depressive disorder, single episode, unspecified: Secondary | ICD-10-CM | POA: Diagnosis not present

## 2019-04-17 DIAGNOSIS — S0993XA Unspecified injury of face, initial encounter: Secondary | ICD-10-CM | POA: Diagnosis present

## 2019-04-17 DIAGNOSIS — W06XXXA Fall from bed, initial encounter: Secondary | ICD-10-CM | POA: Insufficient documentation

## 2019-04-17 DIAGNOSIS — Y9389 Activity, other specified: Secondary | ICD-10-CM | POA: Diagnosis not present

## 2019-04-17 DIAGNOSIS — Z79899 Other long term (current) drug therapy: Secondary | ICD-10-CM | POA: Insufficient documentation

## 2019-04-17 DIAGNOSIS — Y998 Other external cause status: Secondary | ICD-10-CM | POA: Insufficient documentation

## 2019-04-17 DIAGNOSIS — Y92122 Bedroom in nursing home as the place of occurrence of the external cause: Secondary | ICD-10-CM | POA: Insufficient documentation

## 2019-04-17 DIAGNOSIS — R51 Headache: Secondary | ICD-10-CM | POA: Insufficient documentation

## 2019-04-17 DIAGNOSIS — S0181XA Laceration without foreign body of other part of head, initial encounter: Secondary | ICD-10-CM

## 2019-04-17 DIAGNOSIS — Z23 Encounter for immunization: Secondary | ICD-10-CM | POA: Insufficient documentation

## 2019-04-17 MED ORDER — ACETAMINOPHEN 325 MG PO TABS
650.0000 mg | ORAL_TABLET | Freq: Once | ORAL | Status: AC
  Administered 2019-04-18: 650 mg via ORAL
  Filled 2019-04-17: qty 2

## 2019-04-17 MED ORDER — TETANUS-DIPHTH-ACELL PERTUSSIS 5-2.5-18.5 LF-MCG/0.5 IM SUSP
0.5000 mL | Freq: Once | INTRAMUSCULAR | Status: AC
  Administered 2019-04-18: 0.5 mL via INTRAMUSCULAR
  Filled 2019-04-17: qty 0.5

## 2019-04-17 NOTE — ED Provider Notes (Signed)
[facility] Emergency Department Provider Note   ____________________________________________   First MD Initiated Contact with Patient 04/17/19 2317     (approximate)  I have reviewed the triage vital signs and the nursing notes.   HISTORY  Chief Complaint Facial Laceration    HPI Cynthia Landry is a 63 y.o. female brought to the ED via EMS from [facility] status post fall with facial laceration.  Patient reports staff was changing her depends diaper and she rolled out of the bed and struck the floor.  Denies LOC.  Complains of mild headache and facial pain.  Presents with laceration over her right eyebrow which has been Steri-Stripped by SNF staff.  Patient does not recall date of last tetanus shot.  Denies recent fever, cough, chest pain, shortness breath, abdominal pain, nausea or vomiting.  Denies neck, back or hip pain.  Denies recent travel or exposure to persons diagnosed with coronavirus.       Past Medical History:  Diagnosis Date  . ALS (amyotrophic lateral sclerosis) (HCC)   . Amyotrophic lateral sclerosis (HCC)   . Anxiety   . Ataxia   . Chronic pain   . Depression   . Hepatitis C   . Hyperlipidemia   . Hypertension   . Psychosis (HCC)   . Thyroid disease     Patient Active Problem List   Diagnosis Date Noted  . Debility 02/24/2019  . Multifactorial functional impairment 12/30/2018  . Palliative care encounter 12/30/2018  . Dysphagia 12/30/2018  . Metabolic acidosis 11/11/2015    Past Surgical History:  Procedure Laterality Date  . COLONOSCOPY    . ESOPHAGOGASTRODUODENOSCOPY (EGD) WITH PROPOFOL N/A 12/30/2017   Procedure: ESOPHAGOGASTRODUODENOSCOPY (EGD) WITH PROPOFOL;  Surgeon: Christena Deem, MD;  Location: [facility] ENDOSCOPY;  Service: Endoscopy;  Laterality: N/A;  . SHOULDER SURGERY      Prior to Admission medications   Medication Sig Start Date End Date Taking? Authorizing Provider  acetaminophen (TYLENOL)  500 MG tablet Take 1,000 mg by mouth every 6 (six) hours as needed for mild pain or moderate pain. *not to exceed 4 grams/24 hours*    [provider]  atorvastatin (LIPITOR) 20 MG tablet Take 20 mg by mouth daily.    [provider]  bisacodyl (DULCOLAX) 10 MG suppository Place 10 mg rectally daily as needed for mild constipation, moderate constipation or severe constipation.    [provider]  bisacodyl (DULCOLAX) 5 MG EC tablet Take 10 mg by mouth daily as needed for mild constipation, moderate constipation or severe constipation.    [provider]  brinzolamide (AZOPT) 1 % ophthalmic suspension 1 drop 3 (three) times daily.    [provider]  Calcium 500 MG tablet Take 500 mg by mouth daily.    [provider]  calcium carbonate (TUMS - DOSED IN MG ELEMENTAL CALCIUM) 500 MG chewable tablet Chew 1 tablet by mouth daily.    [provider]  dextromethorphan-guaiFENesin (ROBITUSSIN-DM) 10-100 MG/5ML liquid Take by mouth every 4 (four) hours as needed for cough.    [provider]  Docusate Sodium 100 MG capsule Take 100 mg by mouth every 12 (twelve) hours as needed for constipation.    [provider]  DULoxetine (CYMBALTA) 30 MG capsule Take 90 mg by mouth every morning.    [provider]  ergocalciferol (VITAMIN D2) 50000 UNITS capsule Take 50,000 Units by mouth once a week.    [provider]  furosemide (LASIX) 80  MG tablet Take 1 tablet (80 mg total) by mouth daily. 11/14/15   Adrian Saran, MD  gabapentin (NEURONTIN) 600 MG tablet Take 600 mg by mouth 2 (two) times daily.    [provider]  guaiFENesin (ROBITUSSIN) 100 MG/5ML liquid Take 200 mg by mouth 3 (three) times daily as needed for cough.    [provider]  Ipratropium-Albuterol (COMBIVENT) 20-100 MCG/ACT AERS respimat Inhale 1 puff into the lungs every 6 (six) hours.    [provider]  levothyroxine  (SYNTHROID, LEVOTHROID) 75 MCG tablet Take 75 mcg by mouth daily.    [provider]  magnesium hydroxide (MILK OF MAGNESIA) 400 MG/5ML suspension Take by mouth daily as needed for mild constipation.    [provider]  morphine (MS CONTIN) 15 MG 12 hr tablet Take 1 tablet (15 mg total) by mouth every 12 (twelve) hours. 11/14/15   Adrian Saran, MD  omeprazole (PRILOSEC) 20 MG capsule Take 20 mg by mouth 2 (two) times daily before a meal.    [provider]  oxyCODONE (OXY IR/ROXICODONE) 5 MG immediate release tablet Take 1 tablet (5 mg total) by mouth every 6 (six) hours as needed for moderate pain or severe pain. 11/14/15   Adrian Saran, MD  Polyethylene Glycol 3350 (MIRALAX PO) Take 17 g by mouth See admin instructions. Give 17 grams orally with 8 oz of water and drink by mouth twice a day. Then use every 12 hours as needed for constipation.    [provider]  potassium chloride SA (K-DUR,KLOR-CON) 20 MEQ tablet Take 20 mEq by mouth 2 (two) times daily. Take with food.    [provider]  promethazine (PHENERGAN) 25 MG/ML injection Inject 25 mg into the muscle every 6 (six) hours as needed for nausea or vomiting.    [provider]  QUEtiapine (SEROQUEL) 25 MG tablet Take 25 mg by mouth 2 (two) times daily.    [provider]    Allergies Aspirin; Hctz [hydrochlorothiazide]; and Lisinopril  Family History  Problem Relation Age of Onset  . CAD Father   . Diabetes Mellitus II Mother   . Stroke Mother     Social History Social History   Tobacco Use  . Smoking status: Never Smoker  . Smokeless tobacco: Never Used  Substance Use Topics  . Alcohol use: No  . Drug use: No    Review of Systems  Constitutional: No fever/chills Eyes: No visual changes. ENT: Positive for facial laceration.  No sore throat. Cardiovascular: Denies chest pain. Respiratory: Denies shortness of breath. Gastrointestinal: No abdominal pain.  No  nausea, no vomiting.  No diarrhea.  No constipation. Genitourinary: Negative for dysuria. Musculoskeletal: Negative for back pain. Skin: Negative for rash. Neurological: Negative for headaches, focal weakness or numbness.   ____________________________________________   PHYSICAL EXAM:  VITAL SIGNS: ED Triage Vitals  Enc Vitals Group     BP 04/17/19 2318 (!) 140/91     Pulse Rate 04/17/19 2318 (!) 117     Resp 04/17/19 2318 18     Temp 04/17/19 2318 98.6 F (37 C)     Temp Source 04/17/19 2318 Oral     SpO2 04/17/19 2318 96 %     Weight 04/17/19 2319 178 lb (80.7 kg)     Height 04/17/19 2319  (1.575 m)     Head Circumference --      Peak Flow --      Pain Score 04/17/19 2319 10  Pain Loc --      Pain Edu? --      Excl. in GC? --     Constitutional: Alert and oriented.  Chronically ill appearing and in no acute distress. Eyes: Conjunctivae are normal. PERRL. EOMI. Head: Approximately 1 cm vertically linear superficial laceration above right eyebrow which is nonbleeding with overlying Steri-Strips. Nose: Atraumatic. Mouth/Throat: Mucous membranes are moist.  Right lower lip with tiny abrasion which is nonbleeding and does not require sutures. Neck: No stridor.  No cervical spine tenderness to palpation. Cardiovascular: Normal rate, regular rhythm. Grossly normal heart sounds.  Good peripheral circulation. Respiratory: Normal respiratory effort.  No retractions. Lungs CTAB. Gastrointestinal: Soft and nontender. No distention. No abdominal bruits. No CVA tenderness. Musculoskeletal: Contractures in all extremities Neurologic:  Normal speech and language. No gross focal neurologic deficits are appreciated.  Patient is bedbound. Skin:  Skin is warm, dry and intact. No rash noted. Psychiatric: Mood and affect are normal. Speech and behavior are normal.  ____________________________________________   LABS (all labs ordered are listed, but only abnormal results are  displayed)  Labs Reviewed - No data to display ____________________________________________  EKG  None ____________________________________________  RADIOLOGY  ED MD interpretation: No ICH  Official radiology report(s): Ct Head Wo Contrast  Result Date: 04/18/2019 CLINICAL DATA:  Initial evaluation for acute trauma, fall. EXAM: CT HEAD WITHOUT CONTRAST TECHNIQUE: Contiguous axial images were obtained from the base of the skull through the vertex without intravenous contrast. COMPARISON:  Prior CT from 10/26/2008. FINDINGS: Brain: Cerebral volume within normal limits for age. No acute intracranial hemorrhage. No acute large vessel territory infarct. No mass lesion, midline shift, or mass effect. Mild diffuse ventricular prominence somewhat out of proportion to cortical sulcation, nonspecific, but can be seen in the setting of underlying NPH. No extra-axial fluid collection. Vascular: No hyperdense vessel. Scattered vascular calcifications noted within the carotid siphons. Skull: Soft tissue contusion with laceration present at the right supraorbital region. No calvarial fracture. Sinuses/Orbits: Globes and orbital soft tissues within normal limits. Paranasal sinuses are clear. No mastoid effusion. Other: None. IMPRESSION: 1. No acute intracranial abnormality. 2. Soft tissue contusion with laceration at the right supraorbital region. No calvarial fracture. 3. Mild diffuse ventricular prominence somewhat out of proportion to cortical sulcation, nonspecific, but can be seen in the setting of underlying NPH. Electronically Signed   By: Rise Mu M.D.   On: 04/18/2019 00:24    ____________________________________________   PROCEDURES  Procedure(s) performed (including Critical Care):  Procedures   ____________________________________________   INITIAL IMPRESSION / ASSESSMENT AND PLAN / ED COURSE  As part of my medical decision making, I reviewed the following data within the  electronic MEDICAL RECORD NUMBER Nursing notes reviewed and incorporated, Old chart reviewed, Radiograph reviewed and Notes from prior ED visits     DESEREA EIDSON was evaluated in Emergency Department on 04/18/2019 for the symptoms described in the history of present illness. She was evaluated in the context of the global COVID-19 pandemic, which necessitated consideration that the patient might be at risk for infection with the SARS-CoV-2 virus that causes COVID-19. Institutional protocols and algorithms that pertain to the evaluation of patients at risk for COVID-19 are in a state of rapid change based on information released by regulatory bodies including the CDC and federal and state organizations. These policies and algorithms were followed during the patient's care in the ED.   63 year old female who presents status post mechanical fall with facial laceration.  Will obtain CT  head to evaluate for intracranial hemorrhage.  Update tetanus booster and reassess.  Clinical Course as of Apr 17 240  Sun Apr 18, 2019  0041 Updated patient on CT scan results.  Strict return precautions given.  Patient verbalizes understanding and agrees with plan of care.   [JS]    Clinical Course User Index [JS] Irean Hong, MD     ____________________________________________   FINAL CLINICAL IMPRESSION(S) / ED DIAGNOSES  Final diagnoses:  Facial laceration, initial encounter     ED Discharge Orders    None       Note:  This document was prepared using Dragon voice recognition software and may include unintentional dictation errors.   Irean Hong, MD 04/18/19 [phone]

## 2019-04-17 NOTE — ED Triage Notes (Signed)
Pt is from [facility]. They were changing her depends and she rolled out of the bed as they were doing it. Lac to rt eyebrow that they put steri strips on

## 2019-04-18 NOTE — Discharge Instructions (Addendum)
Your tetanus has been updated and will be good for 10 years.  Keep wound clean and dry.  Steri-Strips should fall off in about 1 week.  Return to the ER for worsening symptoms, persistent vomiting, lethargy or other concerns.

## 2019-05-28 ENCOUNTER — Other Ambulatory Visit: Payer: Self-pay

## 2019-05-28 ENCOUNTER — Encounter: Payer: Self-pay | Admitting: Nurse Practitioner

## 2019-05-28 ENCOUNTER — Non-Acute Institutional Stay: Payer: Medicare Other | Admitting: Nurse Practitioner

## 2019-05-28 VITALS — BP 127/87 | HR 87 | Resp 18 | Ht 62.0 in | Wt 179.7 lb

## 2019-05-28 DIAGNOSIS — R1310 Dysphagia, unspecified: Secondary | ICD-10-CM

## 2019-05-28 DIAGNOSIS — Z515 Encounter for palliative care: Secondary | ICD-10-CM

## 2019-05-28 DIAGNOSIS — R5381 Other malaise: Secondary | ICD-10-CM

## 2019-05-28 NOTE — Progress Notes (Signed)
[facility] Palliative [facility] Consult Note Telephone: [phone](336) [phone]  Fax: [phone]) [phone]  PATIENT NAME: Cynthia Landry DOB: 8Therapist, nutritional/19/1957 MRN: [id number]  PRIMARY CARE PROVIDER:  Dr Kathleen LimeHodges REFERRING PROVIDER:  Dr Hodges/[facility] RESPONSIBLE PARTY:    Cynthia Landry daughter Health Care power-of-attorney [phone]  RECOMMENDATIONS and PLAN:  1. Palliative care encounter Z51.5; Palliative medicine team will continue to support patient, patient's family, and medical team. Visit consisted of counseling and education dealing with the complex and emotionally intense issues of symptom management and palliative care in the setting of serious and potentially life-threatening illness  2. Dysphagia R13.10; secondary to ALS. Continue to monitor weights, appetite, aspiration precautions.   3. Debility R53.81 secondary to ALS, encourage passive rom; encourage to transfer to geri-chair.   ASSESSMENT:     Palliative care 5 / 7 / 2019 Palliative care 11 / 21 / 2017 to 11 / 9 / 2018 Hospice 11 / 11 / 2018 to 5 / 1 / 2019  5 / 19 / 2020 weight 177.3 lbs 6 / 4 / 2020 weight 178.6 lbs 7 / 9 / 2020 weight 179.7 lbs BMI 32.9  I visited and observed Ms. Cynthia Landry. We talked about purpose of palliative care visit and she was in agreement. We talked about how she was feeling today and she verbalize that she's doing just fine. She denied any difficulty with pain, shortness of breath. She denies worsening dysphasia. She shares that her appetite is good. She is a little sad with no visitors but she does communicate with her daughter. We talked about the challenges with loss of Independence and now challenges with no visitors due to covid pandemic. We talked about coping strategies. She recently received a new roommate. Therapeutic listening and emotional support provided. She does continue to remain stable in the study of chronic disease of ALS. [facility] but discharge due to stability. Will continue to follow monitor with palliative care with next visit in 8 weeks if needed or sooner should she declined. I have attempted to contact her daughter. I updated nursing staff new new changes to current goals or plan of care  I spent 45 minutes providing this consultation,  from 12:30pm to 1:15pm. More than 50% of the time in this consultation was spent coordinating communication.   HISTORY OF PRESENT ILLNESS:  Cynthia Landry is a 63 y.o. year old female with multiple medical problems including ALS, dysphasia, hepatitis C, left humerus pathological fractures, muscle weakness. Ms. Cynthia Landry continues to reside at skilled [facility]-Term Care Nursing Facility at [facility]. She does remain bed bound is not a lift out of the bed. She does require staff to turn , prop with pillows, total ADL dependents with incontinent bowel and bladder secondary to ALS. she does require to be fed and weight has been stable no new worsening of dysphagia. Appetite has been fair. Last primary provider note 6 / 11/2018 for ALS. She did have a witness fall on 5/30 2020 rolled out of bed with a laceration on her forehead above the right eye. CT head done in the ER no acute abnormalities. She is also followed by Psychiatry at the facility was last date of service 6 / 2918 / 2020 for mood disorder in the setting of ALS. She is tolerating Cymbalta for mood, Gabapentin for pain, melatonin for Sleep, Flexeril for muscle spasms. No change in medications. At present time she is lying in bed.  She appears debilitated but smiling and comfortable. No visitors present. Palliative Care was asked to help to continue to address goals of care.   CODE STATUS: DNR  PPS: 30% HOSPICE ELIGIBILITY/DIAGNOSIS: TBD  PAST MEDICAL HISTORY:  Past Medical History:  Diagnosis Date   ALS (amyotrophic lateral sclerosis) (HCC)    Amyotrophic lateral sclerosis (HCC)    Anxiety    Ataxia     Chronic pain    Depression    Hepatitis C    Hyperlipidemia    Hypertension    Psychosis ([place])    Thyroid disease     SOCIAL HX:  Social History   Tobacco Use   Smoking status: Never Smoker   Smokeless tobacco: Never Used  Substance Use Topics   Alcohol use: No    ALLERGIES:  Allergies  Allergen Reactions   Aspirin Other (See Comments)    dizziness   Hctz [Hydrochlorothiazide] Other (See Comments)    cramping   Lisinopril Cough     PERTINENT MEDICATIONS:  Outpatient Encounter Medications as of 05/28/2019  Medication Sig   acetaminophen (TYLENOL) 500 MG tablet Take 1,000 mg by mouth every 6 (six) hours as needed for mild pain or moderate pain. *not to exceed 4 grams/24 hours*   atorvastatin (LIPITOR) 20 MG tablet Take 20 mg by mouth daily.   bisacodyl (DULCOLAX) 10 MG suppository Place 10 mg rectally daily as needed for mild constipation, moderate constipation or severe constipation.   brinzolamide (AZOPT) 1 % ophthalmic suspension 1 drop 3 (three) times daily.   Calcium 500 MG tablet Take 500 mg by mouth daily.   Docusate Sodium 100 MG capsule Take 100 mg by mouth every 12 (twelve) hours as needed for constipation.   DULoxetine (CYMBALTA) 30 MG capsule Take 90 mg by mouth every morning.   ergocalciferol (VITAMIN D2) 50000 UNITS capsule Take 50,000 Units by mouth once a week.   furosemide (LASIX) 80 MG tablet Take 1 tablet (80 mg total) by mouth daily.   Ipratropium-Albuterol (COMBIVENT) 20-100 MCG/ACT AERS respimat Inhale 1 puff into the lungs every 6 (six) hours.   levothyroxine (SYNTHROID, LEVOTHROID) 75 MCG tablet Take 75 mcg by mouth daily.   potassium chloride SA (K-DUR,KLOR-CON) 20 MEQ tablet Take 20 mEq by mouth 2 (two) times daily. Take with food.   [DISCONTINUED] bisacodyl (DULCOLAX) 5 MG EC tablet Take 10 mg by mouth daily as needed for mild constipation, moderate constipation or severe constipation.   [DISCONTINUED] calcium carbonate  (TUMS - DOSED IN MG ELEMENTAL CALCIUM) 500 MG chewable tablet Chew 1 tablet by mouth daily.   [DISCONTINUED] dextromethorphan-guaiFENesin (ROBITUSSIN-DM) 10-100 MG/5ML liquid Take by mouth every 4 (four) hours as needed for cough.   [DISCONTINUED] gabapentin (NEURONTIN) 600 MG tablet Take 600 mg by mouth 2 (two) times daily.   [DISCONTINUED] guaiFENesin (ROBITUSSIN) 100 MG/5ML liquid Take 200 mg by mouth 3 (three) times daily as needed for cough.   [DISCONTINUED] magnesium hydroxide (MILK OF MAGNESIA) 400 MG/5ML suspension Take by mouth daily as needed for mild constipation.   [DISCONTINUED] morphine (MS CONTIN) 15 MG 12 hr tablet Take 1 tablet (15 mg total) by mouth every 12 (twelve) hours.   [DISCONTINUED] omeprazole (PRILOSEC) 20 MG capsule Take 20 mg by mouth 2 (two) times daily before a meal.   [DISCONTINUED] oxyCODONE (OXY IR/ROXICODONE) 5 MG immediate release tablet Take 1 tablet (5 mg total) by mouth every 6 (six) hours as needed for moderate pain or severe pain.   [DISCONTINUED] Polyethylene Glycol 3350 (MIRALAX  PO) Take 17 g by mouth See admin instructions. Give 17 grams orally with 8 oz of water and drink by mouth twice a day. Then use every 12 hours as needed for constipation.   [DISCONTINUED] promethazine (PHENERGAN) 25 MG/ML injection Inject 25 mg into the muscle every 6 (six) hours as needed for nausea or vomiting.   [DISCONTINUED] QUEtiapine (SEROQUEL) 25 MG tablet Take 25 mg by mouth 2 (two) times daily.   No facility-administered encounter medications on file as of 05/28/2019.     PHYSICAL EXAM:   General: obese, debilitated, pleasant, bedbound female Cardiovascular: regular rate and rhythm Pulmonary: clear ant fields Abdomen: soft, nontender, + bowel sounds GU: no suprapubic tenderness Extremities: +BLE edema, no joint deformities Skin: no rashes Neurological: Weakness but otherwise nonfocal/functionally quadriplegic  Olamide Lahaie Prince RomeZ Anyjah Roundtree, NP

## 2019-09-22 ENCOUNTER — Encounter: Payer: Self-pay | Admitting: Nurse Practitioner

## 2019-09-22 ENCOUNTER — Non-Acute Institutional Stay: Payer: Medicare Other | Admitting: Nurse Practitioner

## 2019-09-22 VITALS — BP 104/71 | HR 82 | Temp 97.7°F | Resp 18 | Wt 177.0 lb

## 2019-09-22 DIAGNOSIS — Z515 Encounter for palliative care: Secondary | ICD-10-CM

## 2019-09-22 NOTE — Progress Notes (Signed)
Therapist, nutritional Palliative Care Consult Note Telephone: [phone]  Fax: [phone]  PATIENT NAME: Cynthia Landry DOB: 1956-01-20 MRN: [id number] PRIMARY CARE PROVIDER:  Dr Kathleen Lime PROVIDER:  Dr Hodges/[facility] RESPONSIBLE PARTY:   Carlynn Purl daughter Health Care power-of-attorney [phone]  RECOMMENDATIONS and PLAN: 1.Palliative care encounter Z51.5; Palliative medicine team will continue to support patient, patient's family, and medical team. Visit consisted of counseling and education dealing with the complex and emotionally intense issues of symptom management and palliative care in the setting of serious and potentially life-threatening illness  2.Dysphagia R13.10; secondary toALS. Continue to monitor weights, appetite, aspiration precautions.  3.Debility R53.81 secondary toALS, encourage passive rom; encourage to transfer to geri-chair.  ASSESSMENT:     Palliative care 5 / 7 / 2019 Palliative care 11 / 21 / 2017 to 11 / 9 / 2018 Hospice 11 / 11 / 2018 to 5 / 1 / 2019  5 / 19 / 2020 weight 177.3 lbs 6 / 4 / 2020 weight 178.6 lbs 7 / 9 / 2020 weight 179.7 lbs 07/28/2019 weight 177.0 lbs  I spent 40 minutes providing this consultation,  from 3:00pm to 3:40pm. More than 50% of the time in this consultation was spent coordinating communication.   HISTORY OF PRESENT ILLNESS:  Cynthia Landry is a 63 y.o. year old female with multiple medical problems including ALS, dysphasia, hepatitis C, left humerus pathological fractures, muscle weakness. [name] continues to reside at Skilled [facility]Term Care Nursing Facility at [facility]. She does have ALS, continues severe functional impairment including bed bound, total ADL dependent and require staff to position and turn her. Stop also does have to feed her. Appetite is been fair. She has been covid positive, now move back to her regular room after  quarantine. Staff endorses has no other changes. At present [name] is  lying in bed. She appears debilitated but comfortable. No visitors present. I visited and [name]. We talked about purpose of palliative care visit. [name] verbalize understanding. We talked about how she was feeling. [name] endorses that she is doing much better. She felt like she had become weak with covid-19 she is feeling much better. [name] has talked with her daughter on the phone. We talked about symptoms of pain and shortness of breath what she denies. We talked about poor appetite which is improving. We talked about foods that she likes. [name] denies coughing or worsening dysphasia. Ms Luedke endorses she is glad to be back to her regular room. We talked about residing at facility with challenges. We talked about medical goals continue to remain unchanged treat what is treatable in place in DNR. We talked about role of palliative care and plan of care. Discuss that will follow up in 4 weeks if needed or sooner should she declined. [name] is in agreement. I have attempted to contact her daughter, unable to leave a message but will continue. I updated nursing staff, no changes to current goals or plan of care. I called and left a message with Carlynn Purl, [name] daughter for update on PC visit  Palliative Care was asked to help to continue to address goals of care.   CODE STATUS: DNR  PPS: 30% HOSPICE ELIGIBILITY/DIAGNOSIS: TBD  PAST MEDICAL HISTORY:  Past Medical History:  Diagnosis Date  . ALS (amyotrophic lateral sclerosis) (HCC)   . Amyotrophic lateral sclerosis (HCC)   . Anxiety   .  Ataxia   . Chronic pain   . Depression   . Hepatitis C   . Hyperlipidemia   . Hypertension   . Psychosis ([place])   . Thyroid disease     SOCIAL HX:  Social History   Tobacco Use  . Smoking status: Never Smoker  . Smokeless tobacco: Never Used  Substance Use Topics  . Alcohol use: No    ALLERGIES:   Allergies  Allergen Reactions  . Aspirin Other (See Comments)    dizziness  . Hctz [Hydrochlorothiazide] Other (See Comments)    cramping  . Lisinopril Cough     PERTINENT MEDICATIONS:  Outpatient Encounter Medications as of 09/22/2019  Medication Sig  . acetaminophen (TYLENOL) 500 MG tablet Take 1,000 mg by mouth every 6 (six) hours as needed for mild pain or moderate pain. *not to exceed 4 grams/24 hours*  . atorvastatin (LIPITOR) 20 MG tablet Take 20 mg by mouth daily.  . bisacodyl (DULCOLAX) 10 MG suppository Place 10 mg rectally daily as needed for mild constipation, moderate constipation or severe constipation.  . brinzolamide (AZOPT) 1 % ophthalmic suspension 1 drop 3 (three) times daily.  . Calcium 500 MG tablet Take 500 mg by mouth daily.  . Cranberry 450 MG TABS Take 1 tablet by mouth daily.  . cyclobenzaprine (FLEXERIL) 5 MG tablet Take 5 mg by mouth 3 (three) times daily.  [name] Sodium 100 MG capsule Take 100 mg by mouth every 12 (twelve) hours as needed for constipation.  . DULoxetine (CYMBALTA) 30 MG capsule Take 90 mg by mouth every morning.  . ergocalciferol (VITAMIN D2) 50000 UNITS capsule Take 50,000 Units by mouth once a week.  . furosemide (LASIX) 80 MG tablet Take 1 tablet (80 mg total) by mouth daily.  [name] gabapentin (NEURONTIN) 300 MG capsule Take 300 mg by mouth at bedtime.  . insulin glargine (LANTUS) 100 UNIT/ML injection Inject 10 Units into the skin at bedtime.  . insulin lispro (HUMALOG) 100 UNIT/ML injection Inject 5 Units into the skin 3 (three) times daily before meals.  . Ipratropium-Albuterol (COMBIVENT) 20-100 MCG/ACT AERS respimat Inhale 1 puff into the lungs every 6 (six) hours.  [name] levothyroxine (SYNTHROID, LEVOTHROID) 75 MCG tablet Take 75 mcg by mouth daily.  . Melatonin 3 MG SUBL Place 3 mg under the tongue. qhs  . metFORMIN (GLUCOPHAGE) 500 MG tablet Take 500 mg by mouth daily.  [name] omeprazole (PRILOSEC) 20 MG capsule Take 20 mg by mouth. Give on  days Monday, Wed, Friday  . potassium chloride SA (K-DUR,KLOR-CON) 20 MEQ tablet Take 20 mEq by mouth 2 (two) times daily. Take with food.   No facility-administered encounter medications on file as of 09/22/2019.     PHYSICAL EXAM:   General: NAD,severely debilitated, pleasant female,  Cardiovascular: regular rate and rhythm Pulmonary: clear ant fields/poor air movement Abdomen: soft, nontender, + bowel sounds Extremities: + edema, no joint deformities Neurological: functional quadriplegic  Obdulio Mash Ihor Gully, NP

## 2019-09-23 ENCOUNTER — Other Ambulatory Visit: Payer: Self-pay

## 2019-10-27 ENCOUNTER — Encounter: Payer: Self-pay | Admitting: Nurse Practitioner

## 2019-10-27 ENCOUNTER — Non-Acute Institutional Stay: Payer: Medicare Other | Admitting: Nurse Practitioner

## 2019-10-27 VITALS — BP 129/82 | HR 76 | Temp 97.8°F | Resp 20 | Wt 179.1 lb

## 2019-10-27 DIAGNOSIS — Z515 Encounter for palliative care: Secondary | ICD-10-CM

## 2019-10-27 DIAGNOSIS — G1221 Amyotrophic lateral sclerosis: Secondary | ICD-10-CM

## 2019-10-27 NOTE — Progress Notes (Signed)
Therapist, nutritional Palliative Care Consult Note Telephone: [phone]  Fax: [phone]  PATIENT NAME: Cynthia Landry DOB: [birth year] MRN: [id number]  PRIMARY CARE PROVIDER:Dr Kathleen Lime PROVIDER:Dr Hodges/[facility] RESPONSIBLE PARTY:Latisha Dutch Quint daughter Health Care power-of-attorney [phone]  RECOMMENDATIONS and PLAN: 1.ACP: DNR, medical goals of care to focus on comfort, previously under hospice services but d/c due to stability, will revisit when declines  ASSESSMENT: Palliative care 5 / 7 / 2019 Palliative care 11 / 21 / 2017 to 11 / 9 / 2018 Hospice 11 / 11 / 2018 to 5 / 1 / 2019  2.Dysphagia R13.10; secondary toALS. Continue to monitor weights, appetite, aspiration precautions.  3.Debility R53.81 secondary toALS, encourage passive rom; encourage to transfer to geri-chair.  4. Palliative care encounter Z51.5; Palliative medicine team will continue to support patient, patient's family, and medical team. Visit consisted of counseling and education dealing with the complex and emotionally intense issues of symptom management and palliative care in the setting of serious and potentially life-threatening illness  I spent 45 minutes providing this consultation,  from 12:30pm to 1:15pm. More than 50% of the time in this consultation was spent coordinating communication.   HISTORY OF PRESENT ILLNESS:  Cynthia Landry is a 63 y.o. year old female with multiple medical problems including ALS, dysphasia, hepatitis C, left humerus pathological fractures, muscle weakness. [name] continues to reside in Skilled [facility]Term [facility] Nursing Facility at [facility]. She remains bedbound, requiring staff to turn and position her with pillows  secondary to ALS. [name] is total ADL dependence with incontinence of bowel and bladder. Ms Ayars does required to be fed with a good appetite per staff. Staff endorses  no new changes. [name] does have a new room with the new roommate. No recent wounds, Falls. [name] does continue to wear BiPAP at night and napping during the day. Last primary provider visit 9 / 15 / 2020 for hypothyroidism, monthly monitoring. [name] is followed by Psychiatry its last dataservice 69 / 3 / 2020 for depression and insomnia with no medication adjustment at this visit. Medical goals DNR, do not intubate, do not hospitalize but wishes for antibiotics come up lab testing and diagnostic testing. At present [name] is lying in bed. [name] appears debilitated, obese, comfortable. No visitors present. I visited and observe [name]. [name] is able to answer questions though it does take her some time with difficulty speaking. [name] endorses no new changes or worsening dysphasia. [name] endorses her appetite is good. We talked about her new room at the facility, quality of life. We talked about coping strategies. [name] talked about missing her daughter with covid-19 allowed in the building but she does speak with her on the phone. Ms Riga denies symptoms of pain or shortness of breath. [name] endorses that she is doing well. We talked about role of palliative care and plan of care. Discussed at present time with Ms Kretz being stable will follow up in one month if needed or sooner should she declined. [name] in agreement. I have attempted to contact her daughter for update on palliative care visit. We talked about role of palliative care and plan of care. Discussed at present time with [name] being stable will follow up in one month if needed or sooner should she declined. [name] in agreement. I have attempted to contact her daughter for update on palliative care visit.  I have updated nursing staff many changes to current goals or plan of care.  Palliative Care was asked to help to continue to address goals of care.   CODE STATUS: DNR  PPS: 30% HOSPICE ELIGIBILITY/DIAGNOSIS:  TBD  PAST MEDICAL HISTORY:  Past Medical History:  Diagnosis Date  . ALS (amyotrophic lateral sclerosis) ([place])   . Amyotrophic lateral sclerosis ([place])   . Anxiety   . Ataxia   . Chronic pain   . Depression   . Hepatitis C   . Hyperlipidemia   . Hypertension   . Psychosis ([place])   . Thyroid disease     SOCIAL HX:  Social History   Tobacco Use  . Smoking status: Never Smoker  . Smokeless tobacco: Never Used  Substance Use Topics  . Alcohol use: No    ALLERGIES:  Allergies  Allergen Reactions  . Aspirin Other (See Comments)    dizziness  . Hctz [Hydrochlorothiazide] Other (See Comments)    cramping  . Lisinopril Cough     PERTINENT MEDICATIONS:  Outpatient Encounter Medications as of 10/27/2019  Medication Sig  . acetaminophen (TYLENOL) 500 MG tablet Take 1,000 mg by mouth every 6 (six) hours as needed for mild pain or moderate pain. *not to exceed 4 grams/24 hours*  . atorvastatin (LIPITOR) 20 MG tablet Take 20 mg by mouth daily.  . bisacodyl (DULCOLAX) 10 MG suppository Place 10 mg rectally daily as needed for mild constipation, moderate constipation or severe constipation.  . brinzolamide (AZOPT) 1 % ophthalmic suspension 1 drop 3 (three) times daily.  . Calcium 500 MG tablet Take 500 mg by mouth daily.  . Cranberry 450 MG TABS Take 1 tablet by mouth daily.  . cyclobenzaprine (FLEXERIL) 5 MG tablet Take 5 mg by mouth 3 (three) times daily.  [name] Sodium 100 MG capsule Take 100 mg by mouth every 12 (twelve) hours as needed for constipation.  . DULoxetine (CYMBALTA) 30 MG capsule Take 90 mg by mouth every morning.  . ergocalciferol (VITAMIN D2) 50000 UNITS capsule Take 50,000 Units by mouth once a week.  . furosemide (LASIX) 80 MG tablet Take 1 tablet (80 mg total) by mouth daily.  [name] gabapentin (NEURONTIN) 300 MG capsule Take 300 mg by mouth at bedtime.  . insulin glargine (LANTUS) 100 UNIT/ML injection Inject 10 Units into the skin at bedtime.  . insulin lispro  (HUMALOG) 100 UNIT/ML injection Inject 5 Units into the skin 3 (three) times daily before meals.  . Ipratropium-Albuterol (COMBIVENT) 20-100 MCG/ACT AERS respimat Inhale 1 puff into the lungs every 6 (six) hours.  [name] levothyroxine (SYNTHROID, LEVOTHROID) 75 MCG tablet Take 75 mcg by mouth daily.  . Melatonin 3 MG SUBL Place 3 mg under the tongue. qhs  . metFORMIN (GLUCOPHAGE) 500 MG tablet Take 500 mg by mouth daily.  [name] omeprazole (PRILOSEC) 20 MG capsule Take 20 mg by mouth. Give on days Monday, Wed, Friday  . potassium chloride SA (K-DUR,KLOR-CON) 20 MEQ tablet Take 20 mEq by mouth 2 (two) times daily. Take with food.   No facility-administered encounter medications on file as of 10/27/2019.     PHYSICAL EXAM:   General: NAD, severely debilitated, pleasant female Cardiovascular: regular rate and rhythm Pulmonary: clear ant fields Abdomen: soft, nontender, + bowel sounds Extremities: + edema, no joint deformities Neurological: functional quadriplegic  Oliva Montecalvo Ihor Gully, NP

## 2019-10-28 ENCOUNTER — Other Ambulatory Visit: Payer: Self-pay

## 2019-12-29 ENCOUNTER — Encounter: Payer: Self-pay | Admitting: Nurse Practitioner

## 2019-12-29 ENCOUNTER — Other Ambulatory Visit: Payer: Self-pay

## 2019-12-29 ENCOUNTER — Non-Acute Institutional Stay: Payer: Medicare Other | Admitting: Nurse Practitioner

## 2019-12-29 VITALS — BP 142/78 | HR 82 | Temp 97.9°F | Resp 18 | Wt 170.0 lb

## 2019-12-29 DIAGNOSIS — Z515 Encounter for palliative care: Secondary | ICD-10-CM

## 2019-12-29 DIAGNOSIS — G1221 Amyotrophic lateral sclerosis: Secondary | ICD-10-CM

## 2019-12-29 NOTE — Progress Notes (Signed)
Therapist, nutritional Palliative Care Consult Note Telephone: [phone]  Fax: [phone]  PATIENT NAME: Cynthia Landry DOB: [birth year] MRN: [id number]  PRIMARY CARE PROVIDER:Dr Kathleen Lime PROVIDER:Dr Hodges/[facility] RESPONSIBLE PARTY:Latisha Dutch Quint daughter Health Care power-of-attorney [phone]  RECOMMENDATIONS and PLAN: 1.ACP: DNR, medical goals of care to focus on comfort, previously under hospice services but d/c due to stability, will revisit when declines  ASSESSMENT: Palliative care 5 / 7 / 2019 Palliative care 11 / 21 / 2017 to 11 / 9 / 2018 Hospice 11 / 11 / 2018 to 5 / 1 / 2019  2.Dysphagia; secondary toALS. Continue to monitor weights, appetite, aspiration precautions.  3.Debility secondary toALS, encourage passive rom; encourage to transfer to geri-chair.  4. Palliative care encounter; Palliative medicine team will continue to support patient, patient's family, and medical team. Visit consisted of counseling and education dealing with the complex and emotionally intense issues of symptom management and palliative care in the setting of serious and potentially life-threatening illness  I spent 40 minutes providing this consultation,  from 9:50am to 10:30am. More than 50% of the time in this consultation was spent coordinating communication.   HISTORY OF PRESENT ILLNESS:  Cynthia Landry is a 64 y.o. year old female with multiple medical problems including ALS, dysphasia, hepatitis C, left humerus pathological fractures, muscle weakness.  [name] continues to reside at [facility] at [facility].  [name] continues to remain bed-bound, require stop for positioning and turning secondary to ALS. [name] is ADL dependent, incontinence bowel and bladder. [name] does required to be fed and appetite has been good. Staff endorses no new changes or concerns.  Has primary provider visit 1 / 20 / 2021 for comprehensive review she was covid positive 9 / 23 / 2020 with mild symptoms. Wishes are for DNR, do not hospitalize, do not intubate, wishes are for lab testing, diagnostic testing, antibiotics, IV fluids. [name] is followed by Psychiatry at the facility was last date of service 2 / 4 / 2021 for major depression for what she take Cymbalta and moves have been stable with insomnia on melatonin. No new changes at this visit. No recent falls, wounds, hospitalizations. [name] does continuing to use her BiPAP at night. Care plan meeting held 2 / 1 / 2021 with no changes noted. At present [name] is lying in bed. [name] appears debilitated but comfortable. No visitors present. I visited and observed [name]. We talked about purpose of palliative care visit and [name] in agreement. We talked about how she was feeling. [name] makes eye contact in verbalizes that she's doing all right. [name] endorses no new changes with dysphagia or difficulty eating. [name] is on a diabetic diet soft bite-size texture with regular liquid consistency. [name] had [name] sitting next to her bedside stand. We talked about her eating. [name] endorses it was delicious. We talked about progression of ALS. We talked about at present time no new changes. We talked about medical goals of care. We talked about the challenges involved in visitor policy with Covid in pandemic. We talked about loss of Independence. We talked about coping strategies. We talked about role of palliative care and plan of care. We talked about at present time will continue with focus on comfort. Discuss that will revisit without its her next visit in eight weeks if needed or sooner should she  declined. [name] in agreement. I updated nursing staff. I attempted to contact her daughter for update on palliative care visit. Palliative Care was asked to help to continue to address goals of care.    CODE STATUS: DNR  PPS: 30% HOSPICE ELIGIBILITY/DIAGNOSIS: TBD  PAST MEDICAL HISTORY:  Past Medical History:  Diagnosis Date  . ALS (amyotrophic lateral sclerosis) ([place])   . Amyotrophic lateral sclerosis ([place])   . Anxiety   . Ataxia   . Chronic pain   . Depression   . Hepatitis C   . Hyperlipidemia   . Hypertension   . Psychosis ([place])   . Thyroid disease     SOCIAL HX:  Social History   Tobacco Use  . Smoking status: Never Smoker  . Smokeless tobacco: Never Used  Substance Use Topics  . Alcohol use: No    ALLERGIES:  Allergies  Allergen Reactions  . Aspirin Other (See Comments)    dizziness  . Hctz [Hydrochlorothiazide] Other (See Comments)    cramping  . Lisinopril Cough     PERTINENT MEDICATIONS:  Outpatient Encounter Medications as of 12/29/2019  Medication Sig  . acetaminophen (TYLENOL) 500 MG tablet Take 1,000 mg by mouth every 6 (six) hours as needed for mild pain or moderate pain. *not to exceed 4 grams/24 hours*  . atorvastatin (LIPITOR) 20 MG tablet Take 20 mg by mouth daily.  . bisacodyl (DULCOLAX) 10 MG suppository Place 10 mg rectally daily as needed for mild constipation, moderate constipation or severe constipation.  . brinzolamide (AZOPT) 1 % ophthalmic suspension 1 drop 3 (three) times daily.  . Calcium 500 MG tablet Take 500 mg by mouth daily.  . Cranberry 450 MG TABS Take 1 tablet by mouth daily.  . cyclobenzaprine (FLEXERIL) 5 MG tablet Take 5 mg by mouth 3 (three) times daily.  [name] Sodium 100 MG capsule Take 100 mg by mouth every 12 (twelve) hours as needed for constipation.  . DULoxetine (CYMBALTA) 30 MG capsule Take 90 mg by mouth every morning.  . ergocalciferol (VITAMIN D2) 50000 UNITS capsule Take 50,000 Units by mouth once a week.  . furosemide (LASIX) 80 MG tablet Take 1 tablet (80 mg total) by mouth daily.  [name] gabapentin (NEURONTIN) 300 MG capsule Take 300 mg by mouth at bedtime.  . insulin glargine (LANTUS) 100 UNIT/ML  injection Inject 10 Units into the skin at bedtime.  . insulin lispro (HUMALOG) 100 UNIT/ML injection Inject 5 Units into the skin 3 (three) times daily before meals.  . Ipratropium-Albuterol (COMBIVENT) 20-100 MCG/ACT AERS respimat Inhale 1 puff into the lungs every 6 (six) hours.  [name] levothyroxine (SYNTHROID, LEVOTHROID) 75 MCG tablet Take 75 mcg by mouth daily.  . Melatonin 3 MG SUBL Place 3 mg under the tongue. qhs  . metFORMIN (GLUCOPHAGE) 500 MG tablet Take 500 mg by mouth daily.  [name] omeprazole (PRILOSEC) 20 MG capsule Take 20 mg by mouth. Give on days Monday, Wed, Friday  . potassium chloride SA (K-DUR,KLOR-CON) 20 MEQ tablet Take 20 mEq by mouth 2 (two) times daily. Take with food.   No facility-administered encounter medications on file as of 12/29/2019.    PHYSICAL EXAM:   General: chronically ill, severely debilitated, pleasant female Cardiovascular: regular rate and rhythm Pulmonary: clear ant fields Abdomen: soft, nontender, + bowel sounds Extremities: + edema, no joint deformities Neurological: functionally quadriplegic  Seymone Forlenza Ihor Gully, NP

## 2020-04-05 ENCOUNTER — Other Ambulatory Visit: Payer: Self-pay

## 2020-04-05 ENCOUNTER — Non-Acute Institutional Stay: Payer: Medicare Other | Admitting: Nurse Practitioner

## 2020-04-05 ENCOUNTER — Encounter: Payer: Self-pay | Admitting: Nurse Practitioner

## 2020-04-05 VITALS — BP 132/72 | HR 75 | Temp 97.7°F | Resp 18 | Wt 177.0 lb

## 2020-04-05 DIAGNOSIS — Z515 Encounter for palliative care: Secondary | ICD-10-CM

## 2020-04-05 DIAGNOSIS — G1221 Amyotrophic lateral sclerosis: Secondary | ICD-10-CM

## 2020-04-05 NOTE — Progress Notes (Signed)
Therapist, nutritional Palliative Care Consult Note Telephone: [phone]  Fax: [phone]  PATIENT NAME: Cynthia Landry DOB: May 02, 1956 MRN: [id number]  PRIMARY CARE PROVIDER:Dr Cynthia Landry PROVIDER:[facility] RESPONSIBLE PARTY:Cynthia Landry daughter Health Care power-of-attorney [phone]  RECOMMENDATIONS and PLAN: 1.ACP: DNR, medical goals of care to focus on comfort, previously under hospice services but d/c due to stability, will revisit when declines  ASSESSMENT: Palliative care 5 / 7 / 2019 Palliative care 11 / 21 / 2017 to 11 / 9 / 2018 Hospice 11 / 11 / 2018 to 5 / 1 / 2019  2.Dysphagia; secondary toALS. Continue to monitor weights, appetite, aspiration precautions.  3.Debility secondary toALS, encourage passive rom; encourage to transfer to geri-chair.  4.Palliative care encounter; Palliative medicine team will continue to support patient, patient's family, and medical team. Visit consisted of counseling and education dealing with the complex and emotionally intense issues of symptom management and palliative care in the setting of serious and potentially life-threatening illness  I spent 60 minutes providing this consultation, at 11:00am. More than 50% of the time in this consultation was spent coordinating communication.   HISTORY OF PRESENT ILLNESS:  Cynthia Landry is a 64 y.o. year old female with multiple medical problems including ALS, dysphasia, hepatitis C, left humerus pathological fractures, muscle weakness.Cynthia Landry continues to reside at [facility]. Cynthia. Landry remain total dependent for turning, mobility, transfers come in adl's, toileting as she does her man incontinent bowel and bladder. Cynthia. Landry does require staff to feed her with current weight 177.0, BMI 32.4 and no noted weight loss. Staff endorses no new changes or  concerns. No recent Falls, wounds, hospitalizations come infections. Last primary provider visit by [facility] nurse practitioner 4 / [phone] / 2021 depression well controlled with talk therapy. Cynthia. Landry does cry sometimes though consolable. Cynthia. Landry followed by Psychiatry with last visit 5/6 / 2021 for depression, insomnia for what she does take Melatonin, Cymbalta for mood. No changes at this visit. At present Cynthia Landry is lying in bed smiling. Cynthia. Landry appears debilitated but comfortable. No visitors present. I visited and Cynthia. Landry. We talked about purpose of palliative care visit. Cynthia. Landry in agreement. We talked about how she has been feeling. Cynthia. Landry endorses that she is doing well. Cynthia Landry endorses she misses her daughter as an in-person visit but does see her at the window. We talked about challenges with social isolation. We talked about challenges with loss of Independence and residing at a facility. We talked about symptoms of pain and shortness of breath which Cynthia. Landry endorses he does not experience. We talked about dysphasia, Cynthia Landry so she does not feel like this is worse than anything. Appetite has been good and weight has been stable. We talked about the foods that she likes. We talked about her wearing her CPAP machine. Cynthia. Landry endorses that she does continue to wear it but staff at times puts it on incorrectly. Cynthia. Landry endorses that she does tell staff when that happens. We talked about medical goals of care with wishes for DNR, do not intubate, do not hospitalized. We talked about treat what is treatable and place. We talked about roller palliative care and plan of care. At present Cynthia. Landry does continue to be stable. We talked about follow up palliative care visit in one month if needed or sooner should Cynthia. Landry decline. Cynthia. Landry  in agreement. I have attempted to contact her daughter Cynthia Landry for update on palliative care visit. I updated nursing staff new changes to current goals are plan of  care. . Palliative Care was asked to help to continue to address goals of care.   CODE STATUS: DNR  PPS: 30% HOSPICE ELIGIBILITY/DIAGNOSIS: TBD  PAST MEDICAL HISTORY:  Past Medical History:  Diagnosis Date  . ALS (amyotrophic lateral sclerosis) ([place])   . Amyotrophic lateral sclerosis ([place])   . Anxiety   . Ataxia   . Chronic pain   . Depression   . Hepatitis C   . Hyperlipidemia   . Hypertension   . Psychosis ([place])   . Thyroid disease     SOCIAL HX:  Social History   Tobacco Use  . Smoking status: Never Smoker  . Smokeless tobacco: Never Used  Substance Use Topics  . Alcohol use: No    ALLERGIES:  Allergies  Allergen Reactions  . Aspirin Other (See Comments)    dizziness  . Hctz [Hydrochlorothiazide] Other (See Comments)    cramping  . Lisinopril Cough     PERTINENT MEDICATIONS:  Outpatient Encounter Medications as of 04/05/2020  Medication Sig  . acetaminophen (TYLENOL) 500 MG tablet Take 1,000 mg by mouth every 6 (six) hours as needed for mild pain or moderate pain. *not to exceed 4 grams/24 hours*  . atorvastatin (LIPITOR) 20 MG tablet Take 10 mg by mouth daily.   . bisacodyl (DULCOLAX) 10 MG suppository Place 10 mg rectally daily as needed for mild constipation, moderate constipation or severe constipation.  . brinzolamide (AZOPT) 1 % ophthalmic suspension 1 drop 3 (three) times daily.  . Calcium 500 MG tablet Take 500 mg by mouth daily.  . Cranberry 450 MG TABS Take 1 tablet by mouth daily.  . cyclobenzaprine (FLEXERIL) 5 MG tablet Take 5 mg by mouth 3 (three) times daily.  Cynthia Landry Sodium 100 MG capsule Take 100 mg by mouth every 12 (twelve) hours as needed for constipation.  . DULoxetine (CYMBALTA) 30 MG capsule Take 30 mg by mouth every morning.   . ergocalciferol (VITAMIN D2) 50000 UNITS capsule Take 50,000 Units by mouth once a week.  . furosemide (LASIX) 80 MG tablet Take 1 tablet (80 mg total) by mouth daily.  [name] gabapentin (NEURONTIN) 300 MG capsule  Take 300 mg by mouth at bedtime.  . insulin glargine (LANTUS) 100 UNIT/ML injection Inject 10 Units into the skin at bedtime.  . insulin lispro (HUMALOG) 100 UNIT/ML injection Inject 5 Units into the skin 3 (three) times daily before meals.  . Ipratropium-Albuterol (COMBIVENT) 20-100 MCG/ACT AERS respimat Inhale 1 puff into the lungs every 6 (six) hours.  [name] levothyroxine (SYNTHROID, LEVOTHROID) 75 MCG tablet Take 75 mcg by mouth daily.  . Melatonin 3 MG SUBL Place 3 mg under the tongue. qhs  . metFORMIN (GLUCOPHAGE) 500 MG tablet Take 500 mg by mouth daily.  [name] omeprazole (PRILOSEC) 20 MG capsule Take 20 mg by mouth. Give on days Monday, Wed, Friday  . potassium chloride SA (K-DUR,KLOR-CON) 20 MEQ tablet Take 20 mEq by mouth 2 (two) times daily. Take with food.   No facility-administered encounter medications on file as of 04/05/2020.    PHYSICAL EXAM:   General: NAD, severely debilitated, pleasant female Cardiovascular: regular rate and rhythm Pulmonary: clear ant fields Abdomen: soft, nontender, + bowel sounds Extremities: mild BLE edema, no joint deformities Neurological: quadriplegic  Sanna Porcaro Ihor Gully, NP

## 2020-05-26 ENCOUNTER — Encounter: Payer: Self-pay | Admitting: Nurse Practitioner

## 2020-05-26 ENCOUNTER — Non-Acute Institutional Stay: Payer: Medicare Other | Admitting: Nurse Practitioner

## 2020-05-26 DIAGNOSIS — Z515 Encounter for palliative care: Secondary | ICD-10-CM

## 2020-05-26 DIAGNOSIS — G1221 Amyotrophic lateral sclerosis: Secondary | ICD-10-CM

## 2020-05-26 NOTE — Progress Notes (Signed)
Therapist, nutritional Palliative Care Consult Note Telephone: [phone]  Fax: [phone]  PATIENT NAME: Cynthia Landry DOB: 1956-06-27 MRN: [id number]   PRIMARY CARE PROVIDER:Dr Kathleen Lime PROVIDER:Dr Hodges/[facility] RESPONSIBLE PARTY:Latisha Dutch Quint daughter Health Care power-of-attorney [phone]  RECOMMENDATIONS and PLAN: 1.ACP: DNR, medical goals of care to focus on comfort, previously under hospice services but d/c due to stability, will revisit when declines  ASSESSMENT: Palliative care 5 / 7 / 2019 Palliative care 11 / 21 / 2017 to 11 / 9 / 2018 Hospice 11 / 11 / 2018 to 5 / 1 / 2019  2.Dysphagia; secondary toALS. Continue to monitor weights, appetite, aspiration precautions.  3.Debility secondary toALS, encourage passive rom; encourage to transfer to geri-chair.  4.Palliative care encounter; Palliative medicine team will continue to support patient, patient's family, and medical team. Visit consisted of counseling and education dealing with the complex and emotionally intense issues of symptom management and palliative care in the setting of serious and potentially life-threatening illness  I spent 60 minutes providing this consultation, started 11:00am. More than 50% of the time in this consultation was spent coordinating communication.   HISTORY OF PRESENT ILLNESS:  Cynthia Landry is a 64 y.o. year old female with multiple medical problems including ALS, dysphasia, hepatitis C, left humerus pathological fractures, muscle weakness.Cynthia Landry continues to reside at [facility] at [facility] Cynthia Landry does remain bed-bound secondary to ALS. Cynthia Landry does not require staff to turn in position her, total adl's within [facility] and bladder. Cynthia Landry does require staff to feed her and appetite has been fair first staff. No recent wounds, falls, infections,  hospitalizations. Cynthia Landry is a DNR. I present Cynthia Landry is lying in bed. Cynthia Landry appears comfortable. No visitors present. I visited and observed Cynthia Landry. Cynthia Landry made eye contact and was able to have a cake for her needs. Cynthia Landry and I talked about purpose of palliative care visit. Cynthia Landry I  agreement. We talked about how her day is going. Cynthia Landry endorses she is having a good day. Cynthia Landry talked about being able to look at her window frequently. We talked about her daughter are coming to visit in person which makes her very happy. We talked about symptoms of pain and shortness of breath what she denies. We talked about her appetite. Cynthia Landry endorses better appetite has been decreasing. Cynthia Landry has not been very hungry lately. Cynthia Landry endorses the food does not seem to taste as good. Cynthia Landry endorses that she does not feel like she is having trouble swallowing but just not hungry. We talked about food choices. We talked about supplement. We talked about medical goals of care. We talked about resetting it skilled facility and challenges with relying on others for basic needs. We talked about coping strategies. We talked about role of palliative care in plan of care. We talked about follow up visit in 4 weeks to monitor weight and appetite or sooner should Cynthia Landry decline. Cynthia Landry in agreement. I have attempted to contact her daughter for update on palliative care visit. Therapeutic listening and emotional support provided. Contact information. Questions answered to satisfaction. I have updated nursing any changes to current goals or plan of care.  Palliative Care was asked to help to continue address goals of care.   CODE STATUS:   PPS: 0% HOSPICE ELIGIBILITY/DIAGNOSIS: TBD  PAST MEDICAL HISTORY:  Past  Medical History:  Diagnosis Date  . ALS (amyotrophic lateral sclerosis) ([facility])   . Amyotrophic lateral sclerosis ([facility])   . Anxiety   . Ataxia   . Chronic pain   . Depression   . Hepatitis  C   . Hyperlipidemia   . Hypertension   . Psychosis ([facility])   . Thyroid disease     SOCIAL HX:  Social History   Tobacco Use  . Smoking status: Never Smoker  . Smokeless tobacco: Never Used  Substance Use Topics  . Alcohol use: No    ALLERGIES:  Allergies  Allergen Reactions  . Aspirin Other (See Comments)    dizziness  . Hctz [Hydrochlorothiazide] Other (See Comments)    cramping  . Lisinopril Cough     PERTINENT MEDICATIONS:  Outpatient Encounter Medications as of 05/26/2020  Medication Sig  . acetaminophen (TYLENOL) 500 MG tablet Take 1,000 mg by mouth every 6 (six) hours as needed for mild pain or moderate pain. *not to exceed 4 grams/24 hours*  . atorvastatin (LIPITOR) 20 MG tablet Take 10 mg by mouth daily.   . bisacodyl (DULCOLAX) 10 MG suppository Place 10 mg rectally daily as needed for mild constipation, moderate constipation or severe constipation.  . brinzolamide (AZOPT) 1 % ophthalmic suspension 1 drop 3 (three) times daily.  . Calcium 500 MG tablet Take 500 mg by mouth daily.  . Cranberry 450 MG TABS Take 1 tablet by mouth daily.  . cyclobenzaprine (FLEXERIL) 5 MG tablet Take 5 mg by mouth 3 (three) times daily.  Tery Sanfilippo Sodium 100 MG capsule Take 100 mg by mouth every 12 (twelve) hours as needed for constipation.  . DULoxetine (CYMBALTA) 30 MG capsule Take 30 mg by mouth every morning.   . ergocalciferol (VITAMIN D2) 50000 UNITS capsule Take 50,000 Units by mouth once a week.  . furosemide (LASIX) 80 MG tablet Take 1 tablet (80 mg total) by mouth daily.  [name] gabapentin (NEURONTIN) 300 MG capsule Take 300 mg by mouth at bedtime.  . insulin glargine (LANTUS) 100 UNIT/ML injection Inject 10 Units into the skin at bedtime.  . insulin lispro (HUMALOG) 100 UNIT/ML injection Inject 5 Units into the skin 3 (three) times daily before meals.  . Ipratropium-Albuterol (COMBIVENT) 20-100 MCG/ACT AERS respimat Inhale 1 puff into the lungs every 6 (six) hours.  [name] levothyroxine  (SYNTHROID, LEVOTHROID) 75 MCG tablet Take 75 mcg by mouth daily.  . Melatonin 3 MG SUBL Place 3 mg under the tongue. qhs  . metFORMIN (GLUCOPHAGE) 500 MG tablet Take 500 mg by mouth daily.  [name] omeprazole (PRILOSEC) 20 MG capsule Take 20 mg by mouth. Give on days Monday, Wed, Friday  . potassium chloride SA (K-DUR,KLOR-CON) 20 MEQ tablet Take 20 mEq by mouth 2 (two) times daily. Take with food.   No facility-administered encounter medications on file as of 05/26/2020.    PHYSICAL EXAM:   General: debilitated, pleasant female Cardiovascular: regular rate and rhythm Pulmonary: clear ant fields Neurological: functional quadriplegic  Lucion Dilger Prince [place], NP

## 2020-05-29 ENCOUNTER — Other Ambulatory Visit: Payer: Self-pay

## 2020-08-09 ENCOUNTER — Non-Acute Institutional Stay: Payer: Medicare Other | Admitting: Nurse Practitioner

## 2020-08-09 ENCOUNTER — Other Ambulatory Visit: Payer: Self-pay

## 2020-08-09 ENCOUNTER — Encounter: Payer: Self-pay | Admitting: Nurse Practitioner

## 2020-08-09 VITALS — BP 122/79 | Wt 181.9 lb

## 2020-08-09 DIAGNOSIS — Z515 Encounter for palliative care: Secondary | ICD-10-CM

## 2020-08-09 DIAGNOSIS — G1221 Amyotrophic lateral sclerosis: Secondary | ICD-10-CM

## 2020-08-09 NOTE — Progress Notes (Signed)
[facility] Consult Note Telephone: [phone]  Fax: [phone]  PATIENT NAME: Cynthia Landry DOB: January 27, 1956 MRN: [id number]   PRIMARY CARE PROVIDER:Dr Yves Dill PROVIDER:Dr Hodges/Scott Adair [phone]  RECOMMENDATIONS and PLAN: 1.ACP: DNR, medical goals of care to focus on comfort, previously under hospice services but d/c due to stability, will revisit when declines  ASSESSMENT: Palliative care 5 / 7 / 2019 Palliative care 11 / 21 / 2017 to 11 / 9 / 2018 Hospice 11 / 11 / 2018 to 5 / 1 / 2019  2.Dysphagia; secondary toALS. Continue to monitor weights, appetite, aspiration precautions.  3.Debility secondary toALS, encourage passive rom; encourage to transfer to geri-chair.  4.Palliative care encounter; Palliative medicine team will continue to support patient, patient's family, and medical team. Visit consisted of counseling and education dealing with the complex and emotionally intense issues of symptom management and palliative care in the setting of serious and potentially life-threatening illness   I spent 65 minutes providing this consultation,starting at 1:30pm. More than 50% of the time in this consultation was spent coordinating communication.   HISTORY OF PRESENT ILLNESS:  CHESNEE FLOREN is a 64 y.o. year old female with multiple medical problems including ALS, dysphasia, hepatitis C, left humerus pathological fractures, muscle weakness.[name] continues to reset it [place] at [facility]. 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 [name] does require staff to turn in position her. [name] does require staff to bath, dress and feed her. Appetite has improved with current weight 181.9 with BMI 35 .5. Ms.  Gerstenberger currently remains on a diabetic diet was soft and bite-size texture with regular liquid consistency. Staff endorses no recent falls, wounds, infections, hospitalizations. Care plan meeting was held on 9/21 / 2021 with daughter not responding to the conference call no changes in plan of care. Last primary provider by [facility] Nurse Practitioner 8/13 / 2021 for hypothyroidism currently on levothyroxine with no changes. Medical goals focus on DNR, do not intubate, do not hospitalized, wishes are for antibiotics, diagnostic testing, lab testing. Staff endorses no new changes her concerns. She is followed by Podiatry at the facility as well as Psychiatry. Last psychiatric visit 7 / 29 / 2021 for reoccurring major depressive episodes with history of mood disorder currently on Cymbalta. Insomnia taking melatonin with no changes at this visit. Staff reports no new concerns and mood has been stable. She has been able to see her daughter with visits. At present [name] is lying in bed, appears debilitated but comfortable. No visitors present. I visited and observe naskila. We talked about purpose of palliative care visit. [name] in agreement. We talked about how she was feeling today. [name] endorses that she is doing well, making eye contact and smiling. We talked about symptoms of pain and shortness of breath when she denies. We talked about her appetite which is been good. We talked about dysphasia. Ms. Golliday endorses it does not feel like it has worsened. We talked about her daily routine. We talked about quality-of-life. We talked about the challenges residing at skilled facility and relying on others for needs to be met. We talked about visits she has been having with her daughter. We talked about coping strategies. Ms Scheid denies any concerns or complaints. We talked about medical goals of care reviewed. We talked about follow-up visit  in two months if needed or so should she declined. [name] in agreement.  Attempted to contact her daughter, unable to leave a message. Therapeutic listening and emotional support provided. Questions answered. [name] endorses she was thankful for palliative care visit. I updated nursing staff in the new changes to current goals or plan of care.  Palliative Care was asked to help to continue to address goals of care.   CODE STATUS: DNR  PPS: 30% HOSPICE ELIGIBILITY/DIAGNOSIS: TBD  PAST MEDICAL HISTORY:  Past Medical History:  Diagnosis Date   ALS (amyotrophic lateral sclerosis) (HCC)    Amyotrophic lateral sclerosis (HCC)    Anxiety    Ataxia    Chronic pain    Depression    Hepatitis C    Hyperlipidemia    Hypertension    Psychosis ([place])    Thyroid disease     SOCIAL HX:  Social History   Tobacco Use   Smoking status: Never Smoker   Smokeless tobacco: Never Used  Substance Use Topics   Alcohol use: No    ALLERGIES:  Allergies  Allergen Reactions   Aspirin Other (See Comments)    dizziness   Hctz [Hydrochlorothiazide] Other (See Comments)    cramping   Lisinopril Cough     PERTINENT MEDICATIONS:  Outpatient Encounter Medications as of 08/09/2020  Medication Sig   acetaminophen (TYLENOL) 500 MG tablet Take 1,000 mg by mouth every 6 (six) hours as needed for mild pain or moderate pain. *not to exceed 4 grams/24 hours*   atorvastatin (LIPITOR) 20 MG tablet Take 10 mg by mouth daily.    bisacodyl (DULCOLAX) 10 MG suppository Place 10 mg rectally daily as needed for mild constipation, moderate constipation or severe constipation.   brinzolamide (AZOPT) 1 % ophthalmic suspension 1 drop 3 (three) times daily.   Calcium 500 MG tablet Take 500 mg by mouth daily.   Cranberry 450 MG TABS Take 1 tablet by mouth daily.   cyclobenzaprine (FLEXERIL) 5 MG tablet Take 5 mg by mouth 3 (three) times daily.   Docusate Sodium 100 MG capsule Take 100 mg by mouth every 12 (twelve) hours as needed for constipation.   DULoxetine  (CYMBALTA) 30 MG capsule Take 30 mg by mouth every morning.    ergocalciferol (VITAMIN D2) 50000 UNITS capsule Take 50,000 Units by mouth once a week.   furosemide (LASIX) 80 MG tablet Take 1 tablet (80 mg total) by mouth daily.   gabapentin (NEURONTIN) 300 MG capsule Take 300 mg by mouth at bedtime.   insulin glargine (LANTUS) 100 UNIT/ML injection Inject 10 Units into the skin at bedtime.   insulin lispro (HUMALOG) 100 UNIT/ML injection Inject 5 Units into the skin 3 (three) times daily before meals.   Ipratropium-Albuterol (COMBIVENT) 20-100 MCG/ACT AERS respimat Inhale 1 puff into the lungs every 6 (six) hours.   levothyroxine (SYNTHROID, LEVOTHROID) 75 MCG tablet Take 75 mcg by mouth daily.   Melatonin 3 MG SUBL Place 3 mg under the tongue. qhs   metFORMIN (GLUCOPHAGE) 500 MG tablet Take 500 mg by mouth daily.   omeprazole (PRILOSEC) 20 MG capsule Take 20 mg by mouth. Give on days Monday, Wed, Friday   potassium chloride SA (K-DUR,KLOR-CON) 20 MEQ tablet Take 20 mEq by mouth 2 (two) times daily. Take with food.   No facility-administered encounter medications on file as of 08/09/2020.    PHYSICAL EXAM:   General: NAD,obese, pleasantly debilitated female Cardiovascular: regular rate and rhythm Pulmonary: clear ant fields Neurological:  functional quadriplegic Tuttle, NP

## 2020-11-03 ENCOUNTER — Non-Acute Institutional Stay: Payer: Medicare Other | Admitting: Nurse Practitioner

## 2020-11-03 ENCOUNTER — Encounter: Payer: Self-pay | Admitting: Nurse Practitioner

## 2020-11-03 ENCOUNTER — Other Ambulatory Visit: Payer: Self-pay

## 2020-11-03 DIAGNOSIS — G1221 Amyotrophic lateral sclerosis: Secondary | ICD-10-CM

## 2020-11-03 DIAGNOSIS — Z515 Encounter for palliative care: Secondary | ICD-10-CM

## 2020-11-03 NOTE — Progress Notes (Signed)
Therapist, nutritional Palliative Care Consult Note Telephone: [phone]  Fax: [phone]  PATIENT NAME: Cynthia Landry DOB: 10/02/1956 MRN: [id number]  PRIMARY CARE PROVIDER: [facility] RESPONSIBLE PARTY:Cynthia Landry daughter Health Care power-of-attorney [phone]  RECOMMENDATIONS and PLAN: 1.ACP: DNR, medical goals of care to focus on comfort, previously under hospice services but d/c due to stability, will revisit when declines  ASSESSMENT: Palliative care 5 / 7 / 2019 Palliative care 11 / 21 / 2017 to 11 / 9 / 2018 Hospice 11 / 11 / 2018 to 5 / 1 / 2019  2.Dysphagia; secondary toALS. Continue to monitor weights, appetite, aspiration precautions.  3.Debility secondary toALS, encourage passive rom; encourage to transfer to geri-chair.  4.Palliative care encounter; Palliative medicine team will continue to support patient, patient's family, and medical team. Visit consisted of counseling and education dealing with the complex and emotionally intense issues of symptom management and palliative care in the setting of serious and potentially life-threatening illness  I spent 35 minutes providing this consultation,  Start at 12:40pm. More than 50% of the time in this consultation was spent coordinating communication.   HISTORY OF PRESENT ILLNESS:  Cynthia Landry is a 64 y.o. year old female with multiple medical problems including ALS, dysphasia, hepatitis C, left humerus pathological fractures, muscle weakness. Cynthia Landry continues to reside at [facility] at [facility]. 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 Cynthia Landry requires to be fed with noted weight loss. Current weight 160 lbs which is a weight loss since September 181.9 lb with BMI 31.2. She remains on a diabetic easy  to chew with mincemeat texture regular liquid consistency diet. Staff endorses no recent wounds, falls, hospitalizations. Staff endorses no new changes are concerned. Medical goals reviewed. At present Cynthia Landry is lying in bed, debilitated, comfortable. We talked about purpose of Palliative care visit. Cynthia Landry in agreement feeling today doing well. Cynthia Landry has no complaints or concerns. We talked about swallowing and ALS progression. Cynthia Landry endorses she feels like her from swallowing his remain the same no worsening. We talked about her appetite which is fair. We talked about her daily routine. We talked about resigning at skilled facility. We talked about challenges relying on others for your needs. We talked about quality of life. We talked about family dynamics, her daughter visiting. We talked about role Palliative care. We ttalked about follow-up visit in 2 months if needed or sooner should she declined. Cynthia Landry in agreement. No new changes are recommendations today. Will continue to follow the monitor, staff updated. I have attempted to contact her daughter for update on Palliative care visit. Most of visit supportive. Medical goals reviewed. Palliative Care was asked to help to continue to address goals of care.   CODE STATUS: DNR  PPS: 30% HOSPICE ELIGIBILITY/DIAGNOSIS: TBD  PAST MEDICAL HISTORY:  Past Medical History:  Diagnosis Date  . ALS (amyotrophic lateral sclerosis) (HCC)   . Amyotrophic lateral sclerosis (HCC)   . Anxiety   . Ataxia   . Chronic pain   . Depression   . Hepatitis C   . Hyperlipidemia   . Hypertension   . Psychosis (HCC)   . Thyroid disease     SOCIAL HX:  Social History   Tobacco Use  . Smoking status: Never Smoker  . Smokeless tobacco: Never Used  Substance Use Topics  .  Alcohol use: No    ALLERGIES:  Allergies  Allergen Reactions  . Aspirin Other (See Comments)    dizziness  . Hctz [Hydrochlorothiazide] Other (See Comments)    cramping  .  Lisinopril Cough     PHYSICAL EXAM:   General: debilitated, chronically ill, pleasant female Cardiovascular: regular rate and rhythm Pulmonary: clear ant fields Neurological: functional quadriplegic Kem Hensen Prince Rome, NP

## 2020-12-15 ENCOUNTER — Encounter: Payer: Self-pay | Admitting: Nurse Practitioner

## 2020-12-15 ENCOUNTER — Non-Acute Institutional Stay: Payer: Medicare Other | Admitting: Nurse Practitioner

## 2020-12-15 ENCOUNTER — Other Ambulatory Visit: Payer: Self-pay

## 2020-12-15 VITALS — BP 136/68 | Wt 161.2 lb

## 2020-12-15 DIAGNOSIS — G1221 Amyotrophic lateral sclerosis: Secondary | ICD-10-CM

## 2020-12-15 DIAGNOSIS — Z515 Encounter for palliative care: Secondary | ICD-10-CM

## 2020-12-15 DIAGNOSIS — R1310 Dysphagia, unspecified: Secondary | ICD-10-CM

## 2020-12-15 NOTE — Progress Notes (Signed)
Therapist, nutritional Palliative Care Consult Note Telephone: [phone]  Fax: [phone]  PATIENT NAME: Cynthia Landry DOB: Mar 06, 1956 MRN: [id number]  PRIMARY CARE PROVIDER: [facility] RESPONSIBLE PARTY:Latisha Dutch Quint daughter Health Care power-of-attorney [phone]  RECOMMENDATIONS and PLAN: 1.ACP: DNR, medical goals of care to focus on comfort, previously under hospice services but d/c due to stability, will revisit when declines  ASSESSMENT: Palliative care 5 / 7 / 2019 until present Palliative care 11 / 21 / 2017 to 11 / 9 / 2018 Hospice 11 / 11 / 2018 to 5 / 1 / 2019  2.Dysphagia; secondary toALS. Continue to monitor weights, appetite, aspiration precautions.  3.Debility secondary toALS, encourage passive rom; encourage to transfer to geri-chair.  4.Palliative care encounter; Palliative medicine team will continue to support patient, patient's family, and medical team. Visit consisted of counseling and education dealing with the complex and emotionally intense issues of symptom management and palliative care in the setting of serious and potentially life-threatening illness  5. F/u 1 months for ongoing monitoring weight, dysphagia, ALS progression  I spent 40 minutes providing this consultation,  Starting at 11:30. More than 50% of the time in this consultation was spent coordinating communication.   HISTORY OF PRESENT ILLNESS:  Cynthia Landry is a 65 y.o. year old female with multiple medical problems including  ALS, dysphasia, hepatitis C, left humerus pathological fractures, muscle weakness. Ms. Summey continues to reside in [facility] at [facility] Ms. Mattice remains bed-bound, total ADL dependents for mobility, turning, positioning, transfers, bathing, dressing, toileting is she does remaining continent. Ms. Betton does require staff to feed her and current weight is  161.2 lbs with BMI 31.5 with no recent weight loss. Last [facility] Provider note 12 / 7 / 2021 for hyperlipidemia controlled on medical therapy. Wishes are for DNR, do not intubate, do not hospitalized, wishes are for antibiotics, diagnostic testing, lab testing. Last Psychiatry visit at the facility 12 / 16 / 2021 for insomnia for which she is prescribed melatonin and depression for what she takes Cymbalta for history of mood disorder. No changes at this visit. No recent falls, wounds, infections, hospitalizations. At present Ms. Alleman is lying in bed, appears debilitated, comfortable. No visitors present. I visited and observed Ms. Quintin. We talked about purpose of Palliative care visit. Ms. Loney in agreement. Ms. Bezek speech is slow and it is a little more difficult to understand what she is saying. We talked about how her day is. Ms. Sessa endorses she is doing okay today. We talked about symptoms of pain and shortness of breath but she denies. Ms. Corp dysphasia does not seem to be worsening. Ms. Briski continues to eat fairly well and swallow. We talked about her speech with progression. We talked about her daughter with recent visiting. We talked about facility, challenges with being totally dependent on caregivers. We talked about quality of life, medical goals. We talked about coping strategies. We talked about role Palliative care and plan of care. Will continue to follow monitor with Palliative watching weight, progression of dysphagia. Ms. Purewal previously was under Hospice Services. Will revisit when she starts to decline. Ms. Gillispie in agreement. I have attempted to contact her daughter for update on Palliative care visit. I updated nursing staff in the new changes at present time  9 / 13 / 2021 weight 161.9 lbs 11 / 16 / 2021 weight 160.3 lbs 1 / 17 / 2022 weight  161.2 lbs BMI 31.5  Palliative Care was asked to help to continue to address goals of care.   CODE STATUS: DNR  PPS: 30% HOSPICE  ELIGIBILITY/DIAGNOSIS: TBD  PAST MEDICAL HISTORY:  Past Medical History:  Diagnosis Date  . ALS (amyotrophic lateral sclerosis) (HCC)   . Amyotrophic lateral sclerosis (HCC)   . Anxiety   . Ataxia   . Chronic pain   . Depression   . Hepatitis C   . Hyperlipidemia   . Hypertension   . Psychosis (HCC)   . Thyroid disease     SOCIAL HX:  Social History   Tobacco Use  . Smoking status: Never Smoker  . Smokeless tobacco: Never Used  Substance Use Topics  . Alcohol use: No    ALLERGIES:  Allergies  Allergen Reactions  . Aspirin Other (See Comments)    dizziness  . Hctz [Hydrochlorothiazide] Other (See Comments)    cramping  . Lisinopril Cough      PHYSICAL EXAM:   General: NAD, debilitated pleasant female Cardiovascular: regular rate and rhythm Pulmonary: clear ant fields Extremities: no edema, no joint deformities; muscle wasting; atrophy Neurological: functional quadriplegic  Alejandra Hunt Prince Rome, NP

## 2021-01-15 ENCOUNTER — Non-Acute Institutional Stay: Payer: Medicare Other | Admitting: Nurse Practitioner

## 2021-01-15 ENCOUNTER — Encounter: Payer: Self-pay | Admitting: Nurse Practitioner

## 2021-01-15 DIAGNOSIS — R1310 Dysphagia, unspecified: Secondary | ICD-10-CM

## 2021-01-15 DIAGNOSIS — G1221 Amyotrophic lateral sclerosis: Secondary | ICD-10-CM

## 2021-01-15 DIAGNOSIS — Z515 Encounter for palliative care: Secondary | ICD-10-CM

## 2021-01-15 NOTE — Progress Notes (Signed)
Therapist, nutritional Palliative Care Consult Note Telephone: [phone]  Fax: [phone]  PATIENT NAME: Cynthia Landry DOB: 06-05-1956 MRN: [id number] PRIMARY CARE PROVIDER:[facility] RESPONSIBLE PARTY:Cynthia Landry daughter Health Care power-of-attorney [phone]  RECOMMENDATIONS and PLAN: 1.ACP: DNR, medical goals of care to focus on comfort, previously under hospice services but d/c due to stability, will revisit when declines  ASSESSMENT: Palliative care 5 / 7 / 2019 until present Palliative care 11 / 21 / 2017 to 11 / 9 / 2018 Hospice 11 / 11 / 2018 to 5 / 1 / 2019  2.Dysphagia; secondary toALS. Continue to monitor weights, appetite, aspiration precautions.  3.Debility secondary toALS, encourage passive rom; encourage to transfer to geri-chair.  4.Palliative care encounter; Palliative medicine team will continue to support patient, patient's family, and medical team. Visit consisted of counseling and education dealing with the complex and emotionally intense issues of symptom management and palliative care in the setting of serious and potentially life-threatening illness  5. F/u 1 months for ongoing monitoring weight, dysphagia, ALS progression I spent 40 minutes providing this consultation,  Starting at 2:30pm. More than 50% of the time in this consultation was spent coordinating communication.   HISTORY OF PRESENT ILLNESS:  Cynthia Landry is a 65 y.o. year old female with multiple medical problems including ALS, dysphasia, hepatitis C, left humerus pathological fractures, muscle weakness. Cynthia Landry continues to reside at [facility] at [facility]. Cynthia Landry remains Bed-bound, total ADL dependents including bathing, dressing, toileting as she is incontinent of bowel and bladder. Cynthia Landry is fed with a improved appetite. Current 161.2 gained 1.2 lb weight with BMI 31.5.  Diabetic diet, easy chew with minced meats texture, regular liquids consistency. Staff endorses no new changes are concerns. No recent wounds, falls, infections, hospitalizations. Cynthia Landry previously was under Hospice Services their discharge due to stability. At present Cynthia Landry is lying in bed. Cynthia Landry makes eye contact, verbalizes and answer questions. Cynthia Landry appears comfortable. No visitors present. I visited and observed Cynthia Landry. We talked about purpose of Palliative care visit.  No visitors present. I visited and observed Cynthia Landry. We talked about purpose of Palliative care visit. Cynthia Landry in agreement. We talked about how she was feeling today. Cynthia Landry endorses she has doing well today no new changes our concerns. Cynthia Landry does with mild slurred speech though not changed since last Palliative care visit. Cynthia Landry endorses no worsen in dysphasia or aspiration with eating or drinking. We talked about Cynthia Landry appetite, foods that she likes. We talked about the weight gain. We talked about Cynthia Landry daily routine. We talked about Cynthia Landry, daughter visiting. Talked about medical goals of care. Cynthia Landry was cooperative with the assessment. Talked about role of Palliative care and plan of care. Therapeutic listening, emotional support provided. Contact information. Questions answered to satisfaction. I have attempted to contact Cynthia Landry daughter for update on Palliative care visit.  Palliative Care was asked to help to continue to address goals of care.   CODE STATUS: DNR  PPS: 30% HOSPICE ELIGIBILITY/DIAGNOSIS: TBD  PAST MEDICAL HISTORY:  Past Medical History:  Diagnosis Date  . ALS (amyotrophic lateral sclerosis) (HCC)   . Amyotrophic lateral sclerosis (HCC)   . Anxiety   . Ataxia   . Chronic pain   . Depression   . Hepatitis C   . Hyperlipidemia   . Hypertension   . Psychosis (  HCC)   . Thyroid disease     SOCIAL HX:  Social History   Tobacco Use  . Smoking status: Never Smoker   . Smokeless tobacco: Never Used  Substance Use Topics  . Alcohol use: No    ALLERGIES:  Allergies  Allergen Reactions  . Aspirin Other (See Comments)    dizziness  . Hctz [Hydrochlorothiazide] Other (See Comments)    cramping  . Lisinopril Cough     PHYSICAL EXAM:   General: debilitated, chronically ill female Cardiovascular: regular rate and rhythm Pulmonary: clear ant fields; decrease bases Neurological: functional quadriplegic  Delcie Ruppert Prince [place], NP

## 2021-01-16 ENCOUNTER — Other Ambulatory Visit: Payer: Self-pay

## 2021-03-14 ENCOUNTER — Non-Acute Institutional Stay: Payer: Medicare Other | Admitting: Nurse Practitioner

## 2021-03-14 ENCOUNTER — Other Ambulatory Visit: Payer: Self-pay

## 2021-03-14 ENCOUNTER — Encounter: Payer: Self-pay | Admitting: Nurse Practitioner

## 2021-03-14 ENCOUNTER — Other Ambulatory Visit: Payer: Self-pay | Admitting: Nurse Practitioner

## 2021-03-14 VITALS — BP 130/66 | HR 76 | Temp 98.0°F | Resp 18 | Wt 161.2 lb

## 2021-03-14 DIAGNOSIS — Z515 Encounter for palliative care: Secondary | ICD-10-CM

## 2021-03-14 DIAGNOSIS — R1310 Dysphagia, unspecified: Secondary | ICD-10-CM

## 2021-03-14 DIAGNOSIS — R5381 Other malaise: Secondary | ICD-10-CM

## 2021-03-14 NOTE — Progress Notes (Signed)
[facility] Palliative Care Consult Note Telephone: [phone]  Fax: [phone]    Date of encounter: 03/14/21 PATIENT NAME: Cynthia Landry [address] [place] [address]   [phone] (home)  DOB: 1956/08/27 MRN: [id number] PRIMARY CARE PROVIDER:   [facility] RESPONSIBLE PARTY:    Contact Information    Name Relation Home Work Mobile   [place] Daughter [phone]       I met face to face with patient facility. Palliative Care was asked to follow this patient by consultation request of Dr Reesa Chew to address advance care planning and complex medical decision making. This is a follow up visit.  ASSESSMENT AND PLAN / RECOMMENDATIONS:   Advance Care Planning/Goals of Care: Goals include to maximize quality of life and symptom management. Our advance care planning conversation included a discussion about:     The value and importance of advance care planning   Experiences with loved ones who have been seriously ill or have died   Exploration of personal, cultural or spiritual beliefs that might influence medical decisions   Exploration of goals of care in the event of a sudden injury or illness   Identification and preparation of a healthcare agent   Review and updating or creation of an  advance directive document .  Decision not to resuscitate or to de-escalate disease focused treatments due to poor prognosis.  CODE STATUS: DNR  Symptom Management/Plan: 1.ACP: DNR, medical goals of care to focus on comfort, previously under hospice services but d/c due to stability, will revisit when declines  ASSESSMENT: Palliative care 5 / 7 / 2019until present Palliative care 11 / 21 / 2017 to 11 / 9 / 2018 Hospice 11 / 11 / 2018 to 5 / 1 / 2019  2.Dysphagia; secondary toALS. Continue to monitor weights, appetite, aspiration precautions.  3.Debility secondary toALS, encourage passive rom; encourage  to transfer to geri-chair.  4.Palliative care encounter; Palliative medicine team will continue to support patient, patient's family, and medical team. Visit consisted of counseling and education dealing with the complex and emotionally intense issues of symptom management and palliative care in the setting of serious and potentially life-threatening illness  5.F/u 1 months for ongoing monitoring weight, dysphagia, ALS progression  Follow up Palliative Care Visit: Palliative care will continue to follow for complex medical decision making, advance care planning, and clarification of goals. Return 4 weeks or prn.  I spent 40 minutes providing this consultation starting at 12:45. More than 50% of the time in this consultation was spent in counseling and care coordination.  PPS: 30%  HOSPICE ELIGIBILITY/DIAGNOSIS: TBD  Chief Complaint: Palliative consult follow for complex medical decision making  HISTORY OF PRESENT ILLNESS:  Cynthia Landry is a 65 y.o. year old female  with ALS, dysphasia, hepatitis C, left humerus pathological fractures, muscle weakness. Cynthia Landry continues to reside at [place] [facility]. Cynthia Landry remains Bed-bound, total ADL dependents including bathing, dressing, toileting as she is incontinent of bowel and bladder. Cynthia Landry requires to be fed by staff with current weight 161.2 lbs with BMI 31.5 on regular diet, soft and bite size texture, regular liquids consistency. 02/01/2021 seen by psychiatry for recurrent depression with h/o MDD prescribed Cymbalta and depakote for mood. Insomnia prescribed melatonin with no new orders at this visit. No recent hospitalizations, infections, falls per staff. Staff endorses no other changes or concerns. At present, Cynthia Landry is lying in  bed, appears severely debilitated. No visitors present. I visited and observed Cynthia Landry. We talked about purpose of PC visit. Cynthia Landry in agreement. Cynthia Landry  talked slowly but declines any further difficulty with swallowing, coughing, worsening of dysphagia. Cynthia Landry denies pain. We talked about foods she likes to eat. We talked about nutrition. We talked about residing at facility, challenges of loss of independence. We talked about coping strategies. We talked about quality of life. Cynthia Landry endorses she likes PC visits, support. We talked about getting OOB. Medical goals reviewed. We talked about at present time Cynthia Landry remains stable, when declines will revisit hospice as she was previously under hospice services but d/c due to stability. We talked about role PC in poc. We talked about Cynthia Landry daughter visiting. I have attempted to contact for Cynthia Landry for update PC visit. Will continue current POC, no new recommendations for today. Therapeutic listening, emotional support provided. Updated staff. [name] expressed she was very thankful for PC visit   History obtained from review of EMR, discussion and interview with  facility staff and Ms. Horace. I reviewed available labs, medications, imaging, studies and related documents from the EMR.  Records reviewed and summarized above.   ROS Full 14 system review of systems performed and negative with exception of: as per HPI.  Physical Exam: Constitutional: NAD General: quadriplegic, pleasant female EYES: anicteric sclera, lids intact, no discharge  ENMT:  oral mucous membranes moist CV: S1S2, RRR, mild LE edema Pulmonary: LCTA, no increased work of breathing, no cough, room air Abdomen: normo-active BS + 4 quadrants, soft and non tender GU: deferred MSK: bedbound;  Skin: warm and dry, no rashes or wounds on visible skin Neuro:  + generalized weakness,  no cognitive impairment. quadriplegic Psych: smiling, A and O x 3  Questions and concerns were addressed. The patient/staff was encouraged to call with questions and/or concerns. Provided general support and encouragement, no other unmet needs  identified  Thank you for the opportunity to participate in the care of Cynthia Landry.  The palliative care team will continue to follow. Please call our office at [phone] if we can be of additional assistance.   This chart was dictated using voice recognition software. Despite best efforts to proofread, errors can occur which can change the documentation meaning.  Corleone Biegler Ihor Gully, NP , ACHPN

## 2021-04-02 ENCOUNTER — Emergency Department: Payer: Medicare Other

## 2021-04-02 ENCOUNTER — Observation Stay: Payer: Medicare Other

## 2021-04-02 ENCOUNTER — Other Ambulatory Visit: Payer: Self-pay

## 2021-04-02 ENCOUNTER — Observation Stay
Admission: EM | Admit: 2021-04-02 | Discharge: 2021-04-04 | Disposition: A | Discharge status: Home / Self Care | Payer: Medicare Other | Attending: Internal Medicine | Admitting: Internal Medicine

## 2021-04-02 ENCOUNTER — Encounter: Payer: Self-pay | Admitting: Emergency Medicine

## 2021-04-02 DIAGNOSIS — R1310 Dysphagia, unspecified: Secondary | ICD-10-CM

## 2021-04-02 DIAGNOSIS — E039 Hypothyroidism, unspecified: Secondary | ICD-10-CM | POA: Diagnosis not present

## 2021-04-02 DIAGNOSIS — A419 Sepsis, unspecified organism: Secondary | ICD-10-CM

## 2021-04-02 DIAGNOSIS — I10 Essential (primary) hypertension: Secondary | ICD-10-CM | POA: Diagnosis not present

## 2021-04-02 DIAGNOSIS — B182 Chronic viral hepatitis C: Secondary | ICD-10-CM | POA: Diagnosis present

## 2021-04-02 DIAGNOSIS — R000 Tachycardia, unspecified: Secondary | ICD-10-CM

## 2021-04-02 DIAGNOSIS — D72829 Elevated white blood cell count, unspecified: Secondary | ICD-10-CM

## 2021-04-02 DIAGNOSIS — Z79899 Other long term (current) drug therapy: Secondary | ICD-10-CM | POA: Insufficient documentation

## 2021-04-02 DIAGNOSIS — J449 Chronic obstructive pulmonary disease, unspecified: Secondary | ICD-10-CM

## 2021-04-02 DIAGNOSIS — M79605 Pain in left leg: Secondary | ICD-10-CM | POA: Diagnosis present

## 2021-04-02 DIAGNOSIS — L03116 Cellulitis of left lower limb: Secondary | ICD-10-CM

## 2021-04-02 DIAGNOSIS — R52 Pain, unspecified: Secondary | ICD-10-CM | POA: Diagnosis present

## 2021-04-02 DIAGNOSIS — E119 Type 2 diabetes mellitus without complications: Secondary | ICD-10-CM | POA: Diagnosis not present

## 2021-04-02 DIAGNOSIS — Z20822 Contact with and (suspected) exposure to covid-19: Secondary | ICD-10-CM | POA: Insufficient documentation

## 2021-04-02 DIAGNOSIS — I700 Atherosclerosis of aorta: Secondary | ICD-10-CM | POA: Insufficient documentation

## 2021-04-02 DIAGNOSIS — K8080 Other cholelithiasis without obstruction: Secondary | ICD-10-CM | POA: Insufficient documentation

## 2021-04-02 DIAGNOSIS — Z7984 Long term (current) use of oral hypoglycemic drugs: Secondary | ICD-10-CM | POA: Insufficient documentation

## 2021-04-02 DIAGNOSIS — Z794 Long term (current) use of insulin: Secondary | ICD-10-CM | POA: Diagnosis not present

## 2021-04-02 DIAGNOSIS — G1221 Amyotrophic lateral sclerosis: Secondary | ICD-10-CM | POA: Diagnosis present

## 2021-04-02 LAB — CBC WITH DIFFERENTIAL/PLATELET
Abs Immature Granulocytes: 0.22 10*3/uL — ABNORMAL HIGH (ref 0.00–0.07)
Basophils Absolute: 0.1 10*3/uL (ref 0.0–0.1)
Basophils Relative: 1 %
Eosinophils Absolute: 0.4 10*3/uL (ref 0.0–0.5)
Eosinophils Relative: 3 %
HCT: 47.1 % — ABNORMAL HIGH (ref 36.0–46.0)
Hemoglobin: 14.5 g/dL (ref 12.0–15.0)
Immature Granulocytes: 2 %
Lymphocytes Relative: 33 %
Lymphs Abs: 4.7 10*3/uL — ABNORMAL HIGH (ref 0.7–4.0)
MCH: 28.9 pg (ref 26.0–34.0)
MCHC: 30.8 g/dL (ref 30.0–36.0)
MCV: 93.8 fL (ref 80.0–100.0)
Monocytes Absolute: 1 10*3/uL (ref 0.1–1.0)
Monocytes Relative: 7 %
Neutro Abs: 7.9 10*3/uL — ABNORMAL HIGH (ref 1.7–7.7)
Neutrophils Relative %: 54 %
Platelets: 390 10*3/uL (ref 150–400)
RBC: 5.02 MIL/uL (ref 3.87–5.11)
RDW: 12.2 % (ref 11.5–15.5)
WBC: 14.4 10*3/uL — ABNORMAL HIGH (ref 4.0–10.5)
nRBC: 0 % (ref 0.0–0.2)

## 2021-04-02 LAB — PROTIME-INR
INR: 1 (ref 0.8–1.2)
Prothrombin Time: 13.4 seconds (ref 11.4–15.2)

## 2021-04-02 LAB — BASIC METABOLIC PANEL
Anion gap: 12 (ref 5–15)
BUN: 10 mg/dL (ref 8–23)
CO2: 28 mmol/L (ref 22–32)
Calcium: 8.6 mg/dL — ABNORMAL LOW (ref 8.9–10.3)
Chloride: 101 mmol/L (ref 98–111)
Creatinine, Ser: 0.58 mg/dL (ref 0.44–1.00)
GFR, Estimated: >60 mL/min (ref >60)
Glucose, Bld: 225 mg/dL — ABNORMAL HIGH (ref 70–99)
Potassium: 4 mmol/L (ref 3.5–5.1)
Sodium: 141 mmol/L (ref 135–145)

## 2021-04-02 LAB — APTT: aPTT: 29 seconds (ref 24–36)

## 2021-04-02 LAB — CK: Total CK: 201 U/L (ref 38–234)

## 2021-04-02 LAB — SEDIMENTATION RATE: Sed Rate: 11 mm/hr (ref 0–30)

## 2021-04-02 LAB — LACTIC ACID, PLASMA: Lactic Acid, Venous: 2.1 mmol/L (ref 0.5–1.9)

## 2021-04-02 MED ORDER — ENOXAPARIN SODIUM 40 MG/0.4ML IJ SOSY
40.0000 mg | PREFILLED_SYRINGE | INTRAMUSCULAR | Status: DC
  Administered 2021-04-03 (×2): 40 mg via SUBCUTANEOUS
  Filled 2021-04-02 (×2): qty 0.4

## 2021-04-02 MED ORDER — ONDANSETRON HCL 4 MG PO TABS: 4.0000 mg | ORAL_TABLET | Freq: Four times a day (QID) | ORAL | Status: DC | PRN

## 2021-04-02 MED ORDER — MORPHINE SULFATE (PF) 2 MG/ML IV SOLN
2.0000 mg | INTRAVENOUS | Status: DC | PRN
  Administered 2021-04-03 (×2): 2 mg via INTRAVENOUS
  Filled 2021-04-02 (×2): qty 1

## 2021-04-02 MED ORDER — INSULIN ASPART 100 UNIT/ML IJ SOLN
0.0000 [IU] | Freq: Three times a day (TID) | INTRAMUSCULAR | Status: DC
  Administered 2021-04-03: 3 [IU] via SUBCUTANEOUS
  Administered 2021-04-03: 1 [IU] via SUBCUTANEOUS
  Administered 2021-04-03 – 2021-04-04 (×2): 2 [IU] via SUBCUTANEOUS
  Filled 2021-04-02 (×4): qty 1

## 2021-04-02 MED ORDER — CEFTRIAXONE SODIUM 1 G IJ SOLR
1.0000 g | INTRAMUSCULAR | Status: DC
  Administered 2021-04-02 – 2021-04-03 (×2): 1 g via INTRAVENOUS
  Filled 2021-04-02 (×4): qty 10

## 2021-04-02 MED ORDER — MORPHINE SULFATE (PF) 4 MG/ML IV SOLN
4.0000 mg | Freq: Once | INTRAVENOUS | Status: AC
  Administered 2021-04-02: 4 mg via INTRAVENOUS
  Filled 2021-04-02: qty 1

## 2021-04-02 MED ORDER — ONDANSETRON HCL 4 MG/2ML IJ SOLN: 4.0000 mg | Freq: Four times a day (QID) | INTRAMUSCULAR | Status: DC | PRN

## 2021-04-02 MED ORDER — INSULIN ASPART 100 UNIT/ML IJ SOLN: 0.0000 [IU] | Freq: Every day | INTRAMUSCULAR | Status: DC

## 2021-04-02 MED ORDER — VANCOMYCIN HCL 1750 MG/350ML IV SOLN
1750.0000 mg | Freq: Once | INTRAVENOUS | Status: AC
  Administered 2021-04-02: 1750 mg via INTRAVENOUS
  Filled 2021-04-02: qty 350

## 2021-04-02 MED ORDER — GABAPENTIN 300 MG PO CAPS
300.0000 mg | ORAL_CAPSULE | Freq: Every day | ORAL | Status: DC
  Administered 2021-04-03 (×2): 300 mg via ORAL
  Filled 2021-04-02 (×2): qty 1

## 2021-04-02 MED ORDER — MELATONIN 5 MG PO TABS
5.0000 mg | ORAL_TABLET | Freq: Every day | ORAL | Status: DC
  Administered 2021-04-03 (×2): 5 mg via ORAL
  Filled 2021-04-02 (×2): qty 1

## 2021-04-02 MED ORDER — ACETAMINOPHEN 325 MG PO TABS: 650.0000 mg | ORAL_TABLET | Freq: Four times a day (QID) | ORAL | Status: DC | PRN

## 2021-04-02 MED ORDER — ACETAMINOPHEN 650 MG RE SUPP: 650.0000 mg | Freq: Four times a day (QID) | RECTAL | Status: DC | PRN

## 2021-04-02 MED ORDER — SODIUM CHLORIDE 0.9 % IV BOLUS (SEPSIS)
2000.0000 mL | Freq: Once | INTRAVENOUS | Status: AC
  Administered 2021-04-02: 2000 mL via INTRAVENOUS

## 2021-04-02 MED ORDER — LACTATED RINGERS IV BOLUS
1000.0000 mL | Freq: Once | INTRAVENOUS | Status: AC
  Administered 2021-04-02: 1000 mL via INTRAVENOUS

## 2021-04-02 MED ORDER — MELATONIN 3 MG SL SUBL: 3.0000 mg | SUBLINGUAL_TABLET | Freq: Every day | SUBLINGUAL | Status: DC

## 2021-04-02 MED ORDER — VANCOMYCIN HCL 1250 MG/250ML IV SOLN
1250.0000 mg | INTRAVENOUS | Status: DC
  Filled 2021-04-02: qty 250

## 2021-04-02 MED ORDER — LEVOTHYROXINE SODIUM 50 MCG PO TABS
75.0000 ug | ORAL_TABLET | Freq: Every day | ORAL | Status: DC
  Administered 2021-04-03 – 2021-04-04 (×2): 75 ug via ORAL
  Filled 2021-04-02 (×2): qty 1

## 2021-04-02 MED ORDER — HYDROCODONE-ACETAMINOPHEN 5-325 MG PO TABS
1.0000 | ORAL_TABLET | ORAL | Status: DC | PRN
  Administered 2021-04-03 (×2): 2 via ORAL
  Administered 2021-04-03: 1 via ORAL
  Filled 2021-04-02: qty 1
  Filled 2021-04-02 (×2): qty 2

## 2021-04-02 NOTE — ED Notes (Signed)
Report messaged to tao rn floor nurse 

## 2021-04-02 NOTE — ED Provider Notes (Signed)
[facility] Emergency Department Provider Note ____________________________________________   Event Date/Time   First MD Initiated Contact with Patient 04/02/21 1559     (approximate)  I have reviewed the triage vital signs and the nursing notes.  HISTORY  Chief Complaint No chief complaint on file.   HPI Cynthia Landry is a 65 y.o. femalewho presents to the ED for evaluation of left leg pain.   Chart review indicates patient is bedbound due to ALS and resides at a local SNF, [facility].  Otherwise history of HTN, HLD, hep C and chronic pain syndrome.  DM on insulin.  Patient presents to the ED via EMS from her local SNF due to left leg pain.  Patient reports pain starting over the weekend, over the past 2-3 days, in an atraumatic fashion.  She reports pain diffusely throughout her left leg.  Denies any rough transfers, falls from bed or any injuries.  Denies fevers or other concerns.  Denies chest pain, abdominal pain, shortness of breath, syncope or hemoptysis.   Past Medical History:  Diagnosis Date  . ALS (amyotrophic lateral sclerosis) (HCC)   . Amyotrophic lateral sclerosis (HCC)   . Anxiety   . Ataxia   . Chronic pain   . Depression   . Hepatitis C   . Hyperlipidemia   . Hypertension   . Psychosis (HCC)   . Thyroid disease     Patient Active Problem List   Diagnosis Date Noted  . Intractable pain, left leg 04/02/2021  . Leukocytosis 04/02/2021  . Type 2 diabetes mellitus without complication (HCC) 04/02/2021  . COPD (chronic obstructive pulmonary disease) (HCC) 04/02/2021  . Debility 02/24/2019  . Multifactorial functional impairment 12/30/2018  . Palliative care encounter 12/30/2018  . Dysphagia 12/30/2018  . Metabolic acidosis 11/11/2015  . Depression 12/06/2013  . ALS (amyotrophic lateral sclerosis) (HCC) 02/25/2013  . Chronic hepatitis C (HCC) 06/07/2011  . Hypertension, benign 02/27/2011  . Hypothyroidism 10/23/2010     Past Surgical History:  Procedure Laterality Date  . COLONOSCOPY    . ESOPHAGOGASTRODUODENOSCOPY (EGD) WITH PROPOFOL N/A 12/30/2017   Procedure: ESOPHAGOGASTRODUODENOSCOPY (EGD) WITH PROPOFOL;  Surgeon: Christena Deem, MD;  Location: [facility] ENDOSCOPY;  Service: Endoscopy;  Laterality: N/A;  . SHOULDER SURGERY      Prior to Admission medications   Medication Sig Start Date End Date Taking? Authorizing Provider  acetaminophen (TYLENOL) 500 MG tablet Take 1,000 mg by mouth every 6 (six) hours as needed for mild pain or moderate pain. *not to exceed 4 grams/24 hours*    [provider]  atorvastatin (LIPITOR) 20 MG tablet Take 10 mg by mouth daily.     [provider]  bisacodyl (DULCOLAX) 10 MG suppository Place 10 mg rectally daily as needed for mild constipation, moderate constipation or severe constipation.    [provider]  brinzolamide (AZOPT) 1 % ophthalmic suspension 1 drop 3 (three) times daily.    [provider]  Calcium 500 MG tablet Take 500 mg by mouth daily.    [provider]  Cranberry 450 MG TABS Take 1 tablet by mouth daily.    [provider]  cyclobenzaprine (FLEXERIL) 5 MG tablet Take 5 mg by mouth 3 (three) times daily.    [provider]  Docusate Sodium 100 MG capsule Take 100 mg by mouth every 12 (twelve) hours as needed for constipation.    [provider]  DULoxetine (CYMBALTA) 30 MG capsule Take 30 mg by mouth every  morning.     [provider]  ergocalciferol (VITAMIN D2) 50000 UNITS capsule Take 50,000 Units by mouth once a week.    [provider]  furosemide (LASIX) 80 MG tablet Take 1 tablet (80 mg total) by mouth daily. 11/14/15   Adrian Saran, MD  gabapentin (NEURONTIN) 300 MG capsule Take 300 mg by mouth at bedtime.    [provider]  insulin glargine (LANTUS) 100 UNIT/ML injection Inject 10 Units into the skin at bedtime.    [provider]   insulin lispro (HUMALOG) 100 UNIT/ML injection Inject 5 Units into the skin 3 (three) times daily before meals.    [provider]  Ipratropium-Albuterol (COMBIVENT) 20-100 MCG/ACT AERS respimat Inhale 1 puff into the lungs every 6 (six) hours.    [provider]  levothyroxine (SYNTHROID, LEVOTHROID) 75 MCG tablet Take 75 mcg by mouth daily.    [provider]  Melatonin 3 MG SUBL Place 3 mg under the tongue. qhs    [provider]  metFORMIN (GLUCOPHAGE) 500 MG tablet Take 500 mg by mouth daily.    [provider]  omeprazole (PRILOSEC) 20 MG capsule Take 20 mg by mouth. Give on days Monday, Wed, Friday    [provider]  potassium chloride SA (K-DUR,KLOR-CON) 20 MEQ tablet Take 20 mEq by mouth 2 (two) times daily. Take with food.    [provider]    Allergies Aspirin, Hctz [hydrochlorothiazide], and Lisinopril  Family History  Problem Relation Age of Onset  . CAD Father   . Diabetes Mellitus II Mother   . Stroke Mother     Social History Social History   Tobacco Use  . Smoking status: Never Smoker  . Smokeless tobacco: Never Used  Vaping Use  . Vaping Use: Never used  Substance Use Topics  . Alcohol use: No  . Drug use: No    Review of Systems  Constitutional: No fever/chills Eyes: No visual changes. ENT: No sore throat. Cardiovascular: Denies chest pain. Respiratory: Denies shortness of breath. Gastrointestinal: No abdominal pain.  No nausea, no vomiting.  No diarrhea.  No constipation. Genitourinary: Negative for dysuria. Musculoskeletal: Negative for back pain.  Positive for left leg pain. Skin: Negative for rash. Neurological: Negative for headaches, focal weakness or numbness.  ____________________________________________   PHYSICAL EXAM:  VITAL SIGNS: Vitals:   04/02/21 1900 04/02/21 1915  BP:  102/60  Pulse:  (!) 127  Resp: 17 18  Temp:    SpO2:  94%     Constitutional: Alert and  oriented.  Bedbound and chronically ill-appearing.  Slow speech cadence with mild dysarthria, but fully oriented, conversational Eyes: Conjunctivae are normal. PERRL. EOMI. Head: Atraumatic. Nose: No congestion/rhinnorhea. Mouth/Throat: Mucous membranes are moist.  Oropharynx non-erythematous. Neck: No stridor. No cervical spine tenderness to palpation. Cardiovascular: Tachycardic rate, regular rhythm. Grossly normal heart sounds.  Good peripheral circulation. Respiratory: Normal respiratory effort.  No retractions. Lungs CTAB. Gastrointestinal: Soft . No CVA tenderness. Mildly distended with left-sided and LLQ tenderness to palpation without peritoneal features. Musculoskeletal:   No joint effusions. No signs of acute trauma. Tenderness throughout the left thigh and left calf.  Nontender left knee, foot and ankle.  Tenderness to palpation anteriorly over her left hip. Other 3 extremities demonstrate no tenderness or signs of trauma Neurologic:  Normal speech and language. No gross focal neurologic deficits are appreciated. No gait instability noted. Skin:  Skin is warm, dry and intact. No rash noted. Psychiatric: Mood and affect  are normal. Speech and behavior are normal.  ____________________________________________   LABS (all labs ordered are listed, but only abnormal results are displayed)  Labs Reviewed  CBC WITH DIFFERENTIAL/PLATELET - Abnormal; Notable for the following components:      Result Value   WBC 14.4 (*)    HCT 47.1 (*)    Neutro Abs 7.9 (*)    Lymphs Abs 4.7 (*)    Abs Immature Granulocytes 0.22 (*)    All other components within normal limits  BASIC METABOLIC PANEL - Abnormal; Notable for the following components:   Glucose, Bld 225 (*)    Calcium 8.6 (*)    All other components within normal limits  SARS CORONAVIRUS 2 (TAT 6-24 HRS)  CK  SEDIMENTATION RATE   ____________________________________________  12 Lead EKG  Sinus tachycardia, rate of 107 bpm.   Normal axis.  Prolonged QTC at 525 ms.  No evidence of acute ischemia. ____________________________________________  RADIOLOGY  ED MD interpretation: Plain film of the pelvis and left hip reviewed by me without evidence of fracture or dislocation.   Official radiology report(s): CT ABDOMEN PELVIS WO CONTRAST  Result Date: 04/02/2021 CLINICAL DATA:  Evaluate for diverticulitis. EXAM: CT ABDOMEN AND PELVIS WITHOUT CONTRAST TECHNIQUE: Multidetector CT imaging of the abdomen and pelvis was performed following the standard protocol without IV contrast. COMPARISON:  11/11/2015 FINDINGS: Lower chest: Scar versus subsegmental atelectasis identified in the left base. Hepatobiliary: No focal liver abnormality. High attenuation material layering in the dependent portion of the gallbladder consistent with stones and or sludge. No signs of gallbladder wall inflammation. No bile duct dilatation. Pancreas: Unremarkable. No pancreatic ductal dilatation or surrounding inflammatory changes. Spleen: Normal in size without focal abnormality. Adrenals/Urinary Tract: Normal adrenal glands. No kidney mass or hydronephrosis identified. No kidney stones identified bilaterally. The urinary bladder is unremarkable. Stomach/Bowel: The stomach appears moderately distended with air-fluid level. The appendix is not confidently identified and may be surgically absent. There is a moderate amount of retained stool identified within the sigmoid colon and rectum. No signs of acute diverticulitis. Vascular/Lymphatic: Aortic atherosclerosis. No aneurysm. No abdominopelvic adenopathy. Reproductive: Uterus and bilateral adnexa are unremarkable. Other: No abdominal wall hernia or abnormality. No abdominopelvic ascites. Musculoskeletal: There are mild, age indeterminate superior endplate deformities involving the L3 and L4 vertebra. These are new when compared with 11/11/2015. IMPRESSION: 1. No acute findings identified within the abdomen or  pelvis. No evidence for acute diverticulitis. 2. Moderate amount of retained stool identified within the sigmoid colon and rectum. Correlate for any clinical signs or symptoms of constipation. 3. Gallstones and/or sludge. 4. Age-indeterminate L3 and L4 superior endplate deformities. 5. Aortic atherosclerosis. Aortic Atherosclerosis (ICD10-I70.0). Electronically Signed   By: Signa Kell M.D.   On: 04/02/2021 18:49   US Venous Img Lower Unilateral Left  Result Date: 04/02/2021 CLINICAL DATA:  Bed-bound patient.  Atraumatic lower extremity pain EXAM: LEFT LOWER EXTREMITY VENOUS DOPPLER ULTRASOUND TECHNIQUE: Gray-scale sonography with compression, as well as color and duplex ultrasound, were performed to evaluate the deep venous system(s) from the level of the common femoral vein through the popliteal and proximal calf veins. COMPARISON:  None. FINDINGS: VENOUS Normal compressibility of the common femoral, superficial femoral, and popliteal veins, as well as the visualized calf veins. Visualized portions of profunda femoral vein and great saphenous vein unremarkable. No filling defects to suggest DVT on grayscale or color Doppler imaging. Doppler waveforms show normal direction of venous flow, normal respiratory plasticity and response to augmentation. Limited views of  the contralateral common femoral vein are unremarkable. OTHER None. Limitations: none IMPRESSION: No evidence LEFT lower extremity deep venous thrombosis. Electronically Signed   By: Genevive Bi M.D.   On: 04/02/2021 16:48   DG Hip Unilat W or Wo Pelvis 2-3 Views Left  Result Date: 04/02/2021 CLINICAL DATA:  Acute left hip pain without known injury. EXAM: DG HIP (WITH OR WITHOUT PELVIS) 2-3V LEFT COMPARISON:  None. FINDINGS: There is no evidence of hip fracture or dislocation. Mild osteophyte formation is seen involving the left hip. IMPRESSION: Mild degenerative change of left hip.  No acute abnormality seen. Electronically Signed   By:  Lupita Raider M.D.   On: 04/02/2021 16:42    ____________________________________________   PROCEDURES and INTERVENTIONS  Procedure(s) performed (including Critical Care):  .1-3 Lead EKG Interpretation Performed by: Delton Prairie, MD Authorized by: Delton Prairie, MD     Interpretation: abnormal     ECG rate:  110   ECG rate assessment: tachycardic     Rhythm: sinus tachycardia     Ectopy: none     Conduction: normal      Medications  morphine 4 MG/ML injection 4 mg (has no administration in time range)  morphine 4 MG/ML injection 4 mg (4 mg Intravenous Given 04/02/21 1705)  lactated ringers bolus 1,000 mL (0 mLs Intravenous Stopped 04/02/21 1916)    ____________________________________________   MDM / ED COURSE   Bedbound 65 year old woman with history of ALS presents to the ED with atraumatic left leg pain of uncertain etiology requiring medical observation admission.  She is persistently tachycardic in a sinus tachycardia, but otherwise hemodynamically stable.  Exam demonstrates tenderness throughout her left thigh, anteriorly and posteriorly, but no skin changes, signs of trauma, induration, no signs of vascular deficits or acute focal neurologic deficits.  Blood work shows a slight leukocytosis and left shift.  Plain film demonstrates no hip fracture or bony abnormality.  Venous ultrasound demonstrates no DVT.  CT of her abdomen, due to her mild tenderness, demonstrates no source of her pain, just mild constipation.  Patient required multiple doses of IV morphine and is persistently tachycardic despite IV fluids.  We will discuss the case with hospitalist medicine for further work-up and management.  Clinical Course as of 04/02/21 1948  Mon Apr 02, 2021  1728 Reassessed.  We discussed benign imaging, but blood work with leukocytosis and left shift.  She still is tender within her abdomen.  We discussed CT imaging of her abdomen.  She is agreeable. [DS]  1947 Reassessed.   Still quite tender and tachycardic.  Asking nurse to help me rule the patient again to look again throughout her leg and lower back, I do not see any skin changes, induration.  I am uncertain what is the source of her pain.  I discussed the case with hospitalist medicine, Dr. Lindajo Royal who agrees to admit [DS]    Clinical Course User Index [DS] Delton Prairie, MD    ____________________________________________   FINAL CLINICAL IMPRESSION(S) / ED DIAGNOSES  Final diagnoses:  Left leg pain  Sinus tachycardia     ED Discharge Orders    None       Cynthya Yam Katrinka Blazing   Note:  This document was prepared using Dragon voice recognition software and may include unintentional dictation errors.   Delton Prairie, MD 04/02/21 1949

## 2021-04-02 NOTE — ED Triage Notes (Signed)
Presents via EMS from [facility]    Presents with left leg/hip pain  States pain started over the weekend    Denies any injury

## 2021-04-02 NOTE — H&P (Signed)
History and Physical    SPECIAL RANES MYT:[id number] DOB: 26-Jun-1956 DOA: 04/02/2021  PCP: Patient, No Pcp Per (Inactive)   Patient coming from: SNF  I have personally briefly reviewed patient's old medical records in [facility]  Chief Complaint: Left thigh pain  HPI: Cynthia Landry is a 65 y.o. female with medical history significant for HTN, hypothyroidism, chronic hepatitis C, depression, ALS, who is bedbound, with dysphagia  and who was sent to the emergency room for evaluation of left thigh pain.  There has been no reported injury or fall and no aggressive transfers.  Patient denies rash, insect bites, or warm or cold feeling in the leg.  Sensation and leg is not worse than baseline.  Denies abdominal pain, nausea vomiting or diarrhea or dysuria and denies vaginal pain or discharge ED course: On arrival, afebrile tachycardic at 127 with BP 112/60 O2 sat 94% on room air.  Leukocytosis of 14,400 and BMP unremarkable. EKG personally viewed and interpreted sinus tachycardia at 107 with no acute ST-T wave changes Imaging: X-ray hip with no acute abnormality next venous Doppler left leg no DVT CT abdomen and pelvis no acute findings.  Moderate amount of retained stool in sigmoid colon and rectum  Patient received 2 doses of morphine in the emergency room and IV hydration.  Continue to complain of intractable pain.  Hospitalist consult done for admission for intractable pain  Review of Systems: Partially obtainable due to abnormality with speech   Past Medical History:  Diagnosis Date  . ALS (amyotrophic lateral sclerosis) (HCC)   . Amyotrophic lateral sclerosis (HCC)   . Anxiety   . Ataxia   . Chronic pain   . Depression   . Hepatitis C   . Hyperlipidemia   . Hypertension   . Psychosis (HCC)   . Thyroid disease     Past Surgical History:  Procedure Laterality Date  . COLONOSCOPY    . ESOPHAGOGASTRODUODENOSCOPY (EGD) WITH PROPOFOL N/A 12/30/2017   Procedure:  ESOPHAGOGASTRODUODENOSCOPY (EGD) WITH PROPOFOL;  Surgeon: Christena Deem, MD;  Location: [facility] ENDOSCOPY;  Service: Endoscopy;  Laterality: N/A;  . SHOULDER SURGERY       reports that she has never smoked. She has never used smokeless tobacco. She reports that she does not drink alcohol and does not use drugs.  Allergies  Allergen Reactions  . Aspirin Other (See Comments)    dizziness  . Hctz [Hydrochlorothiazide] Other (See Comments)    cramping  . Lisinopril Cough    Family History  Problem Relation Age of Onset  . CAD Father   . Diabetes Mellitus II Mother   . Stroke Mother       Prior to Admission medications   Medication Sig Start Date End Date Taking? Authorizing Provider  acetaminophen (TYLENOL) 500 MG tablet Take 1,000 mg by mouth every 6 (six) hours as needed for mild pain or moderate pain. *not to exceed 4 grams/24 hours*    [provider]  atorvastatin (LIPITOR) 20 MG tablet Take 10 mg by mouth daily.     [provider]  bisacodyl (DULCOLAX) 10 MG suppository Place 10 mg rectally daily as needed for mild constipation, moderate constipation or severe constipation.    [provider]  brinzolamide (AZOPT) 1 % ophthalmic suspension 1 drop 3 (three) times daily.    [provider]  Calcium 500 MG tablet Take 500 mg by mouth daily.    [provider]  Cranberry 450 MG TABS Take 1  tablet by mouth daily.    [provider]  cyclobenzaprine (FLEXERIL) 5 MG tablet Take 5 mg by mouth 3 (three) times daily.    [provider]  Docusate Sodium 100 MG capsule Take 100 mg by mouth every 12 (twelve) hours as needed for constipation.    [provider]  DULoxetine (CYMBALTA) 30 MG capsule Take 30 mg by mouth every morning.     [provider]  ergocalciferol (VITAMIN D2) 50000 UNITS capsule Take 50,000 Units by mouth once a week.    [provider]  furosemide (LASIX) 80 MG tablet Take 1  tablet (80 mg total) by mouth daily. 11/14/15   Adrian Saran, MD  gabapentin (NEURONTIN) 300 MG capsule Take 300 mg by mouth at bedtime.    [provider]  insulin glargine (LANTUS) 100 UNIT/ML injection Inject 10 Units into the skin at bedtime.    [provider]  insulin lispro (HUMALOG) 100 UNIT/ML injection Inject 5 Units into the skin 3 (three) times daily before meals.    [provider]  Ipratropium-Albuterol (COMBIVENT) 20-100 MCG/ACT AERS respimat Inhale 1 puff into the lungs every 6 (six) hours.    [provider]  levothyroxine (SYNTHROID, LEVOTHROID) 75 MCG tablet Take 75 mcg by mouth daily.    [provider]  Melatonin 3 MG SUBL Place 3 mg under the tongue. qhs    [provider]  metFORMIN (GLUCOPHAGE) 500 MG tablet Take 500 mg by mouth daily.    [provider]  omeprazole (PRILOSEC) 20 MG capsule Take 20 mg by mouth. Give on days Monday, Wed, Friday    [provider]  potassium chloride SA (K-DUR,KLOR-CON) 20 MEQ tablet Take 20 mEq by mouth 2 (two) times daily. Take with food.    [provider]    Physical Exam: Vitals:   04/02/21 1830 04/02/21 1845 04/02/21 1900 04/02/21 1915  BP:    102/60  Pulse:    (!) 127  Resp: 17 16 17 18   Temp:      TempSrc:      SpO2:    94%  Weight:      Height:         Vitals:   04/02/21 1830 04/02/21 1845 04/02/21 1900 04/02/21 1915  BP:    102/60  Pulse:    (!) 127  Resp: 17 16 17 18   Temp:      TempSrc:      SpO2:    94%  Weight:      Height:          Constitutional:  Chronically ill-appearing and oriented x 3 . Not in any apparent distress HEENT:      Head: Normocephalic and atraumatic.         Eyes: PERLA, EOMI, Conjunctivae are normal. Sclera is non-icteric.       Mouth/Throat: Mucous membranes are moist.       Neck: Supple with no signs of meningismus. Cardiovascular:  Tachycardic. No murmurs, gallops, or rubs. 2+ symmetrical distal  pulses are present . No JVD. No LE edema Respiratory: Respiratory effort normal .Lungs sounds clear bilaterally. No wheezes, crackles, or rhonchi.  Gastrointestinal: Soft, non tender, and non distended with positive bowel sounds.  Genitourinary: No CVA tenderness. Musculoskeletal:  Tenderness on palpation of muscles anterolateral left thigh with overlying redness.  Disuse atrophy of upper and lower limbs Neurologic:  Face is symmetric.  Extension contractures of lower limbs disuse atrophy and weakness of all limbs.  Dysarthria skin: Skin is warm, dry.  No rash or ulcers Psychiatric: Mood and affect are normal    Labs on Admission: I have personally reviewed following labs and imaging studies  CBC: Recent Labs  Lab 04/02/21 1654  WBC 14.4*  NEUTROABS 7.9*  HGB 14.5  HCT 47.1*  MCV 93.8  PLT 390   Basic Metabolic Panel: Recent Labs  Lab 04/02/21 1654  NA 141  K 4.0  CL 101  CO2 28  GLUCOSE 225*  BUN 10  CREATININE 0.58  CALCIUM 8.6*   GFR: Estimated Creatinine Clearance: 65.6 mL/min (by C-G formula based on SCr of 0.58 mg/dL). Liver Function Tests: No results for input(s): AST, ALT, ALKPHOS, BILITOT, PROT, ALBUMIN in the last 168 hours. No results for input(s): LIPASE, AMYLASE in the last 168 hours. No results for input(s): AMMONIA in the last 168 hours. Coagulation Profile: No results for input(s): INR, PROTIME in the last 168 hours. Cardiac Enzymes: No results for input(s): CKTOTAL, CKMB, CKMBINDEX, TROPONINI in the last 168 hours. BNP (last 3 results) No results for input(s): PROBNP in the last 8760 hours. HbA1C: No results for input(s): HGBA1C in the last 72 hours. CBG: No results for input(s): GLUCAP in the last 168 hours. Lipid Profile: No results for input(s): CHOL, HDL, LDLCALC, TRIG, CHOLHDL, LDLDIRECT in the last 72 hours. Thyroid Function Tests: No results for input(s): TSH, T4TOTAL, FREET4, T3FREE, THYROIDAB in the last 72 hours. Anemia Panel: No  results for input(s): VITAMINB12, FOLATE, FERRITIN, TIBC, IRON, RETICCTPCT in the last 72 hours. Urine analysis:    Component Value Date/Time   COLORURINE YELLOW (A) 11/11/2015 1150   APPEARANCEUR CLEAR (A) 11/11/2015 1150   APPEARANCEUR Hazy 01/12/2013 1713   LABSPEC 1.009 11/11/2015 1150   LABSPEC 1.010 01/12/2013 1713   PHURINE 6.0 11/11/2015 1150   GLUCOSEU NEGATIVE 11/11/2015 1150   GLUCOSEU Negative 01/12/2013 1713   HGBUR 1+ (A) 11/11/2015 1150   BILIRUBINUR NEGATIVE 11/11/2015 1150   BILIRUBINUR Negative 01/12/2013 1713   KETONESUR NEGATIVE 11/11/2015 1150   PROTEINUR NEGATIVE 11/11/2015 1150   NITRITE NEGATIVE 11/11/2015 1150   LEUKOCYTESUR NEGATIVE 11/11/2015 1150   LEUKOCYTESUR Negative 01/12/2013 1713    Radiological Exams on Admission: CT ABDOMEN PELVIS WO CONTRAST  Result Date: 04/02/2021 CLINICAL DATA:  Evaluate for diverticulitis. EXAM: CT ABDOMEN AND PELVIS WITHOUT CONTRAST TECHNIQUE: Multidetector CT imaging of the abdomen and pelvis was performed following the standard protocol without IV contrast. COMPARISON:  11/11/2015 FINDINGS: Lower chest: Scar versus subsegmental atelectasis identified in the left base. Hepatobiliary: No focal liver abnormality. High attenuation material layering in the dependent portion of the gallbladder consistent with stones and or sludge. No signs of gallbladder wall inflammation. No bile duct dilatation. Pancreas: Unremarkable. No pancreatic ductal dilatation or surrounding inflammatory changes. Spleen: Normal in size without focal abnormality. Adrenals/Urinary Tract: Normal adrenal glands. No kidney mass or hydronephrosis identified. No kidney stones identified bilaterally. The urinary bladder is unremarkable. Stomach/Bowel: The stomach appears moderately distended with air-fluid level. The appendix is not confidently identified and may be surgically absent. There is a moderate amount of retained stool identified within the sigmoid colon and  rectum. No signs of acute diverticulitis. Vascular/Lymphatic: Aortic atherosclerosis. No aneurysm. No abdominopelvic adenopathy. Reproductive: Uterus and bilateral adnexa are unremarkable. Other: No abdominal wall hernia or abnormality. No abdominopelvic ascites. Musculoskeletal: There are mild, age indeterminate superior endplate deformities involving the L3 and L4 vertebra. These are new when compared with 11/11/2015. IMPRESSION: 1. No acute findings identified within the  abdomen or pelvis. No evidence for acute diverticulitis. 2. Moderate amount of retained stool identified within the sigmoid colon and rectum. Correlate for any clinical signs or symptoms of constipation. 3. Gallstones and/or sludge. 4. Age-indeterminate L3 and L4 superior endplate deformities. 5. Aortic atherosclerosis. Aortic Atherosclerosis (ICD10-I70.0). Electronically Signed   By: Signa Kellaylor  Stroud M.D.   On: 04/02/2021 18:49   US Venous Img Lower Unilateral Left  Result Date: 04/02/2021 CLINICAL DATA:  Bed-bound patient.  Atraumatic lower extremity pain EXAM: LEFT LOWER EXTREMITY VENOUS DOPPLER ULTRASOUND TECHNIQUE: Gray-scale sonography with compression, as well as color and duplex ultrasound, were performed to evaluate the deep venous system(s) from the level of the common femoral vein through the popliteal and proximal calf veins. COMPARISON:  None. FINDINGS: VENOUS Normal compressibility of the common femoral, superficial femoral, and popliteal veins, as well as the visualized calf veins. Visualized portions of profunda femoral vein and great saphenous vein unremarkable. No filling defects to suggest DVT on grayscale or color Doppler imaging. Doppler waveforms show normal direction of venous flow, normal respiratory plasticity and response to augmentation. Limited views of the contralateral common femoral vein are unremarkable. OTHER None. Limitations: none IMPRESSION: No evidence LEFT lower extremity deep venous thrombosis.  Electronically Signed   By: Genevive BiStewart  Edmunds M.D.   On: 04/02/2021 16:48   DG Hip Unilat W or Wo Pelvis 2-3 Views Left  Result Date: 04/02/2021 CLINICAL DATA:  Acute left hip pain without known injury. EXAM: DG HIP (WITH OR WITHOUT PELVIS) 2-3V LEFT COMPARISON:  None. FINDINGS: There is no evidence of hip fracture or dislocation. Mild osteophyte formation is seen involving the left hip. IMPRESSION: Mild degenerative change of left hip.  No acute abnormality seen. Electronically Signed   By: Lupita RaiderJames  Green Jr M.D.   On: 04/02/2021 16:42     Assessment/Plan 65 year old female with history of ALS, bedbound with dysphagia and chronic pain, depression, chronic hepatitis C, hypertension presenting with 3-day history of atraumatic intractable left thigh pain and mostly negative ER work-up.    Intractable pain, left leg - Uncertain etiology, possibly neuropathic - Venous Doppler negative for DVT, x-ray hip nonacute, CT abdomen and pelvis also nonacute.  Physical exam benign except for ALS - Follow CK and sed rate - Pain control and consider consultation to vascular/surgery if worsening - Addendum: sepsis secondary to cellulitis:  -Following admission patient became hypotensive 97/58 and tachycardic to 127 so combined with leukocytosis, code sepsis called with suspected source cellulitis - Started on IV fluid bolus and Rocephin and vancomycin pending further work-up with lactic acid, UA, CXR    Leukocytosis - No stigmata of infection or cellulitis - Was hydrated with an IV fluid bolus - We will get urinalysis    Dysphagia, secondary to ALS   ALS (amyotrophic lateral sclerosis) (HCC) -.  Diet with thickened liquids as recommended by neurologist at last visit November 2021 - Increase nursing assistance    Chronic hepatitis C (HCC) - Not on any treatment currently    Hypothyroidism - Continue levothyroxine  Hypertension, benign - Appears to be on furosemide, pending med rec    Type 2  diabetes mellitus without complication (HCC) -Sliding scale insulin for now    COPD (chronic obstructive pulmonary disease) (HCC) - DuoNeb as needed - Appears to be on BiPAP/trilogy at nights per last neurology note    DVT prophylaxis: Lovenox  Code Status: full code  Family Communication:  none  Disposition Plan: Back to previous home environment Consults called: none  Status: Observation    Andris Baumann MD [facility]     04/02/2021, 7:54 PM

## 2021-04-02 NOTE — ED Notes (Signed)
Pain med given.  Pt alert.  siderails up x 2.

## 2021-04-02 NOTE — Progress Notes (Signed)
CODE SEPSIS - PHARMACY COMMUNICATION  **Broad Spectrum Antibiotics should be administered within 1 hour of Sepsis diagnosis**  Time Code Sepsis Called/Page Received: 2125  Antibiotics Ordered: Ceftriaxone / vancomycin  Time of 1st antibiotic administration: 2228  Additional action taken by pharmacy: Spoke with RN regarding administration of antibiotics within 1 hour goal window  Tressie Ellis 04/02/2021  9:31 PM

## 2021-04-02 NOTE — Consult Note (Signed)
Pharmacy Antibiotic Note  Cynthia Landry is a 65 y.o. female with medical history including ALS, chronic hepatitis C, hypothyroidism, diabetes, COPD admitted on 04/02/2021 with left leg pain. Vitals notable for tachycardia, soft blood pressure. WBC 14.4 with left shift, CK 201, ESR 11. Pharmacy has been consulted for vancomycin dosing. Patient is also ordered ceftriaxone.  Plan:  Vancomycin 1750 mg IV x 1 followed by maintenance regimen of vancomycin 1250 mg IV q24h --Calculated AUC 471, Cmin 10.9 --Daily Scr per protocol --Levels at steady state as clinically indicated  Height: 5' 2"  (157.5 cm) Weight: 73 kg (160 lb 15 oz) IBW/kg (Calculated) : 50.1  Temp (24hrs), Avg:99 F (37.2 C), Min:99 F (37.2 C), Max:99 F (37.2 C)  Recent Labs  Lab 04/02/21 1654  WBC 14.4*  CREATININE 0.58    Estimated Creatinine Clearance: 65.6 mL/min (by C-G formula based on SCr of 0.58 mg/dL).    Allergies  Allergen Reactions  . Aspirin Other (See Comments)    dizziness  . Hctz [Hydrochlorothiazide] Other (See Comments)    cramping  . Lisinopril Cough    Antimicrobials this admission: Ceftriaxone 5/16 >>  Vancomycin 5/16 >>   Dose adjustments this admission: N/A  Microbiology results: 5/16 BCx: pending 5/16 MRSA PCR: pending  Thank you for allowing pharmacy to be a part of this patient's care.  Benita Gutter 04/02/2021 10:02 PM

## 2021-04-02 NOTE — ED Notes (Signed)
Pt awake and talking.  Pt placed on 1 liter oxygen, oygen sats 89 on room air.

## 2021-04-02 NOTE — Sepsis Progress Note (Signed)
Monitoring for the code sepsis protocol. °

## 2021-04-03 DIAGNOSIS — R000 Tachycardia, unspecified: Secondary | ICD-10-CM | POA: Diagnosis not present

## 2021-04-03 DIAGNOSIS — M79605 Pain in left leg: Secondary | ICD-10-CM

## 2021-04-03 DIAGNOSIS — R1310 Dysphagia, unspecified: Secondary | ICD-10-CM

## 2021-04-03 DIAGNOSIS — G1221 Amyotrophic lateral sclerosis: Secondary | ICD-10-CM

## 2021-04-03 LAB — URINALYSIS, COMPLETE (UACMP) WITH MICROSCOPIC
Bilirubin Urine: NEGATIVE
Glucose, UA: NEGATIVE mg/dL
Hgb urine dipstick: NEGATIVE
Ketones, ur: NEGATIVE mg/dL
Leukocytes,Ua: NEGATIVE
Nitrite: NEGATIVE
Protein, ur: NEGATIVE mg/dL
Specific Gravity, Urine: 1.023 (ref 1.005–1.030)
pH: 5 (ref 5.0–8.0)

## 2021-04-03 LAB — GLUCOSE, CAPILLARY
Glucose-Capillary: 123 mg/dL — ABNORMAL HIGH (ref 70–99)
Glucose-Capillary: 153 mg/dL — ABNORMAL HIGH (ref 70–99)
Glucose-Capillary: 158 mg/dL — ABNORMAL HIGH (ref 70–99)
Glucose-Capillary: 181 mg/dL — ABNORMAL HIGH (ref 70–99)
Glucose-Capillary: 227 mg/dL — ABNORMAL HIGH (ref 70–99)

## 2021-04-03 LAB — BASIC METABOLIC PANEL
Anion gap: 9 (ref 5–15)
BUN: 9 mg/dL (ref 8–23)
CO2: 27 mmol/L (ref 22–32)
Calcium: 8 mg/dL — ABNORMAL LOW (ref 8.9–10.3)
Chloride: 103 mmol/L (ref 98–111)
Creatinine, Ser: 0.43 mg/dL — ABNORMAL LOW (ref 0.44–1.00)
GFR, Estimated: >60 mL/min (ref >60)
Glucose, Bld: 176 mg/dL — ABNORMAL HIGH (ref 70–99)
Potassium: 3.7 mmol/L (ref 3.5–5.1)
Sodium: 139 mmol/L (ref 135–145)

## 2021-04-03 LAB — CBC
HCT: 43.1 % (ref 36.0–46.0)
Hemoglobin: 13.5 g/dL (ref 12.0–15.0)
MCH: 29.5 pg (ref 26.0–34.0)
MCHC: 31.3 g/dL (ref 30.0–36.0)
MCV: 94.1 fL (ref 80.0–100.0)
Platelets: 345 10*3/uL (ref 150–400)
RBC: 4.58 MIL/uL (ref 3.87–5.11)
RDW: 12.3 % (ref 11.5–15.5)
WBC: 13.1 10*3/uL — ABNORMAL HIGH (ref 4.0–10.5)
nRBC: 0 % (ref 0.0–0.2)

## 2021-04-03 LAB — HIV ANTIBODY (ROUTINE TESTING W REFLEX): HIV Screen 4th Generation wRfx: NONREACTIVE

## 2021-04-03 LAB — PROCALCITONIN: Procalcitonin: <0.1 ng/mL

## 2021-04-03 LAB — MRSA PCR SCREENING: MRSA by PCR: NEGATIVE

## 2021-04-03 LAB — HEMOGLOBIN A1C
Hgb A1c MFr Bld: 6.9 % — ABNORMAL HIGH (ref 4.8–5.6)
Mean Plasma Glucose: 151.33 mg/dL

## 2021-04-03 LAB — LACTIC ACID, PLASMA: Lactic Acid, Venous: 1.8 mmol/L (ref 0.5–1.9)

## 2021-04-03 LAB — SARS CORONAVIRUS 2 (TAT 6-24 HRS): SARS Coronavirus 2: NEGATIVE

## 2021-04-03 MED ORDER — METHOCARBAMOL 500 MG PO TABS
500.0000 mg | ORAL_TABLET | Freq: Two times a day (BID) | ORAL | Status: DC
  Administered 2021-04-03 – 2021-04-04 (×3): 500 mg via ORAL
  Filled 2021-04-03 (×5): qty 1

## 2021-04-03 NOTE — Plan of Care (Signed)
  Problem: Education: Goal: Knowledge of General Education information will improve Description Including pain rating scale, medication(s)/side effects and non-pharmacologic comfort measures Outcome: Progressing   Problem: Health Behavior/Discharge Planning: Goal: Ability to manage health-related needs will improve Outcome: Progressing   

## 2021-04-03 NOTE — Progress Notes (Addendum)
PROGRESS NOTE    Cynthia Landry  IRS:[id number] DOB: July 06, 1956 DOA: 04/02/2021 PCP: Patient, No Pcp Per (Inactive)   Brief Narrative: Taken from H&P. Cynthia Landry is a 65 y.o. female with medical history significant for HTN, hypothyroidism, chronic hepatitis C, depression, ALS, who is bedbound, with dysphagia  and who was sent to the emergency room for evaluation of left thigh pain.  There has been no reported injury or fall and no aggressive transfers.  Patient denies rash, insect bites, or warm or cold feeling in the leg.  Sensation and leg is not worse than baseline.  Denies abdominal pain, nausea vomiting or diarrhea or dysuria and denies vaginal pain or discharge. Patient was having leukocytosis and imaging was without any acute abnormality or DVT. Did become hypotensive with blood pressure of 97/58 and mildly tachycardic in ED and code sepsis was called and she was started on vancomycin and Rocephin. There was some concern of cellulitis but apparently there is no skin lesion consistent with cellulitis. Mildly positive lactic acid which has been resolved.  UA does not look impressive, procalcitonin negative, chest x-ray without any acute abnormality.  Blood cultures negative so far.  Sepsis ruled out as there is no obvious source of infection.  Subjective: Patient continued to complain about left leg pain.  She was noting yes when asked about leg pain. Appears to be aphasic at baseline.  Assessment & Plan:   Active Problems:   Dysphagia   Intractable pain, left leg   ALS (amyotrophic lateral sclerosis) (HCC)   Chronic hepatitis C (HCC)   Hypothyroidism   Hypertension, benign   Leukocytosis   Type 2 diabetes mellitus without complication (HCC)   COPD (chronic obstructive pulmonary disease) (HCC)  Left leg pain.  Most likely neuropathic with her history of advanced ALS.  Imaging negative for any acute abnormality.  No sign of any cellulitis. -Add Robaxin -Continue with  current pain management.  Sepsis ruled out.  No obvious source of infection.  UA was not done yesterday and today did show mild bacteriuria, no other abnormality.  Blood cultures negative.  Leukocytosis improving. -Discontinue vancomycin -Continue with ceftriaxone to complete 3 doses.  Dysphagia, secondary to ALS   ALS (amyotrophic lateral sclerosis) (HCC) -.  Diet with thickened liquids as recommended by neurologist at last visit November 2021 - Increase nursing assistance    Chronic hepatitis C (HCC) - Not on any treatment currently    Hypothyroidism - Continue levothyroxine  Hypertension.  Blood pressure within goal today. -Keep holding home dose of furosemide due to some concern of initial hypotension.  Patient appears euvolemic.  Type 2 diabetes mellitus without complication (HCC) -Sliding scale insulin for now    COPD (chronic obstructive pulmonary disease) (HCC) - DuoNeb as needed - Appears to be on BiPAP/trilogy at nights per last neurology note.  Objective: Vitals:   04/03/21 0500 04/03/21 0745 04/03/21 1134 04/03/21 1505  BP:  114/77 119/75 113/70  Pulse:  98 (!) 103 100  Resp:  18 16 18   Temp: 97.8 F (36.6 C) 97.8 F (36.6 C) 98.1 F (36.7 C) 98.3 F (36.8 C)  TempSrc: Other (Comment) Oral Oral Oral  SpO2:  98% 98% 99%  Weight:      Height:        Intake/Output Summary (Last 24 hours) at 04/03/2021 1642 Last data filed at 04/03/2021 1340 Gross per 24 hour  Intake 720 ml  Output 500 ml  Net 220 ml   04/05/2021  04/02/21 1616  Weight: 73 kg    Examination:  General exam: Chronically ill-appearing, bedbound lady, appears calm and comfortable  Respiratory system: Clear to auscultation. Respiratory effort normal. Cardiovascular system: S1 & S2 heard, RRR.  Gastrointestinal system: Soft, nontender, nondistended, bowel sounds positive. Central nervous system: Alert, aphasic, bedbound with bilateral feet contractures.  COVID testing Extremities:  No edema, no cyanosis, pulses intact and symmetrical. Psychiatry: Judgement and insight appear impaired   DVT prophylaxis: Lovenox Code Status: DNR , Confirmed with daughter Family Communication: Daughter was updated on phone. Disposition Plan:  Status is: Observation  The patient remains OBS appropriate and will d/c before 2 midnights.  Dispo: The patient is from: SNF              Anticipated d/c is to: SNF              Patient currently is not medically stable to d/c.   Difficult to place patient No              Level of care: Progressive Cardiac  All the records are reviewed and case discussed with Care Management/Social Worker. Management plans discussed with the patient, nursing and they are in agreement.  Consultants:   None  Procedures:  Antimicrobials:  Ceftriaxone  Data Reviewed: I have personally reviewed following labs and imaging studies  CBC: Recent Labs  Lab 04/02/21 1654 04/03/21 0037  WBC 14.4* 13.1*  NEUTROABS 7.9*  --   HGB 14.5 13.5  HCT 47.1* 43.1  MCV 93.8 94.1  PLT 390 345   Basic Metabolic Panel: Recent Labs  Lab 04/02/21 1654 04/03/21 0037  NA 141 139  K 4.0 3.7  CL 101 103  CO2 28 27  GLUCOSE 225* 176*  BUN 10 9  CREATININE 0.58 0.43*  CALCIUM 8.6* 8.0*   GFR: Estimated Creatinine Clearance: 65.6 mL/min (A) (by C-G formula based on SCr of 0.43 mg/dL (L)). Liver Function Tests: No results for input(s): AST, ALT, ALKPHOS, BILITOT, PROT, ALBUMIN in the last 168 hours. No results for input(s): LIPASE, AMYLASE in the last 168 hours. No results for input(s): AMMONIA in the last 168 hours. Coagulation Profile: Recent Labs  Lab 04/02/21 2140  INR 1.0   Cardiac Enzymes: Recent Labs  Lab 04/02/21 1654  CKTOTAL 201   BNP (last 3 results) No results for input(s): PROBNP in the last 8760 hours. HbA1C: Recent Labs    04/02/21 1654  HGBA1C 6.9*   CBG: Recent Labs  Lab 04/03/21 0029 04/03/21 0746 04/03/21 1135  GLUCAP  181* 123* 227*   Lipid Profile: No results for input(s): CHOL, HDL, LDLCALC, TRIG, CHOLHDL, LDLDIRECT in the last 72 hours. Thyroid Function Tests: No results for input(s): TSH, T4TOTAL, FREET4, T3FREE, THYROIDAB in the last 72 hours. Anemia Panel: No results for input(s): VITAMINB12, FOLATE, FERRITIN, TIBC, IRON, RETICCTPCT in the last 72 hours. Sepsis Labs: Recent Labs  Lab 04/02/21 2140 04/03/21 0036 04/03/21 0037  PROCALCITON  --   --  <0.10  LATICACIDVEN 2.1* 1.8  --     Recent Results (from the past 240 hour(s))  MRSA PCR Screening     Status: None   Collection Time: 04/02/21  1:25 AM   Specimen: Nasopharyngeal  Result Value Ref Range Status   MRSA by PCR NEGATIVE NEGATIVE Final    Comment:        The GeneXpert MRSA Assay (FDA approved for NASAL specimens only), is one component of a comprehensive MRSA colonization surveillance program. It is  not intended to diagnose MRSA infection nor to guide or monitor treatment for MRSA infections. Performed at [facility], [address] [place], [place] [address]   SARS CORONAVIRUS 2 (TAT 6-24 HRS) Nasopharyngeal Nasopharyngeal Swab     Status: None   Collection Time: 04/02/21  7:29 PM   Specimen: Nasopharyngeal Swab  Result Value Ref Range Status   SARS Coronavirus 2 NEGATIVE NEGATIVE Final    Comment: (NOTE) SARS-CoV-2 target nucleic acids are NOT DETECTED.  The SARS-CoV-2 RNA is generally detectable in upper and lower respiratory specimens during the acute phase of infection. Negative results do not preclude SARS-CoV-2 infection, do not rule out co-infections with other pathogens, and should not be used as the sole basis for treatment or other patient management decisions. Negative results must be combined with clinical observations, patient history, and epidemiological information. The expected result is Negative.  Fact Sheet for Patients: HairSlick.no  Fact Sheet for  Healthcare Providers: quierodirigir.com  This test is not yet approved or cleared by the [place] FDA and  has been authorized for detection and/or diagnosis of SARS-CoV-2 by FDA under an Emergency Use Authorization (EUA). This EUA will remain  in effect (meaning this test can be used) for the duration of the COVID-19 declaration under Se ction 564(b)(1) of the Act, 21 U.S.C. section 360bbb-3(b)(1), unless the authorization is terminated or revoked sooner.  Performed at [facility], [address] [place], [place] [address]   Culture, blood (x 2)     Status: None (Preliminary result)   Collection Time: 04/02/21  9:40 PM   Specimen: BLOOD  Result Value Ref Range Status   Specimen Description BLOOD BLOOD LEFT HAND  Final   Special Requests   Final    BOTTLES DRAWN AEROBIC AND ANAEROBIC Blood Culture adequate volume   Culture   Final    NO GROWTH < 24 HOURS Performed at [facility], [address], [place], [place] [address]    Report Status PENDING  Incomplete  Culture, blood (x 2)     Status: None (Preliminary result)   Collection Time: 04/02/21  9:41 PM   Specimen: BLOOD  Result Value Ref Range Status   Specimen Description BLOOD BLOOD LEFT HAND  Final   Special Requests   Final    BOTTLES DRAWN AEROBIC AND ANAEROBIC Blood Culture adequate volume   Culture   Final    NO GROWTH < 24 HOURS Performed at [facility], [address] [place], [place] [address]    Report Status PENDING  Incomplete     Radiology Studies: CT ABDOMEN PELVIS WO CONTRAST  Result Date: 04/02/2021 CLINICAL DATA:  Evaluate for diverticulitis. EXAM: CT ABDOMEN AND PELVIS WITHOUT CONTRAST TECHNIQUE: Multidetector CT imaging of the abdomen and pelvis was performed following the standard protocol without IV contrast. COMPARISON:  11/11/2015 FINDINGS: Lower chest: Scar versus subsegmental atelectasis identified in the left base. Hepatobiliary: No  focal liver abnormality. High attenuation material layering in the dependent portion of the gallbladder consistent with stones and or sludge. No signs of gallbladder wall inflammation. No bile duct dilatation. Pancreas: Unremarkable. No pancreatic ductal dilatation or surrounding inflammatory changes. Spleen: Normal in size without focal abnormality. Adrenals/Urinary Tract: Normal adrenal glands. No kidney mass or hydronephrosis identified. No kidney stones identified bilaterally. The urinary bladder is unremarkable. Stomach/Bowel: The stomach appears moderately distended with air-fluid level. The appendix is not confidently identified and may be surgically absent. There is a moderate amount of retained stool  identified within the sigmoid colon and rectum. No signs of acute diverticulitis. Vascular/Lymphatic: Aortic atherosclerosis. No aneurysm. No abdominopelvic adenopathy. Reproductive: Uterus and bilateral adnexa are unremarkable. Other: No abdominal wall hernia or abnormality. No abdominopelvic ascites. Musculoskeletal: There are mild, age indeterminate superior endplate deformities involving the L3 and L4 vertebra. These are new when compared with 11/11/2015. IMPRESSION: 1. No acute findings identified within the abdomen or pelvis. No evidence for acute diverticulitis. 2. Moderate amount of retained stool identified within the sigmoid colon and rectum. Correlate for any clinical signs or symptoms of constipation. 3. Gallstones and/or sludge. 4. Age-indeterminate L3 and L4 superior endplate deformities. 5. Aortic atherosclerosis. Aortic Atherosclerosis (ICD10-I70.0). Electronically Signed   By: Signa Kell M.D.   On: 04/02/2021 18:49   US Venous Img Lower Unilateral Left  Result Date: 04/02/2021 CLINICAL DATA:  Bed-bound patient.  Atraumatic lower extremity pain EXAM: LEFT LOWER EXTREMITY VENOUS DOPPLER ULTRASOUND TECHNIQUE: Gray-scale sonography with compression, as well as color and duplex ultrasound,  were performed to evaluate the deep venous system(s) from the level of the common femoral vein through the popliteal and proximal calf veins. COMPARISON:  None. FINDINGS: VENOUS Normal compressibility of the common femoral, superficial femoral, and popliteal veins, as well as the visualized calf veins. Visualized portions of profunda femoral vein and great saphenous vein unremarkable. No filling defects to suggest DVT on grayscale or color Doppler imaging. Doppler waveforms show normal direction of venous flow, normal respiratory plasticity and response to augmentation. Limited views of the contralateral common femoral vein are unremarkable. OTHER None. Limitations: none IMPRESSION: No evidence LEFT lower extremity deep venous thrombosis. Electronically Signed   By: Genevive Bi M.D.   On: 04/02/2021 16:48   DG Chest Port 1 View  Result Date: 04/02/2021 CLINICAL DATA:  Sepsis EXAM: PORTABLE CHEST 1 VIEW COMPARISON:  11/11/2015 FINDINGS: Low lung volumes with linear scarring or atelectasis left base. Cardiomegaly with central bronchovascular crowding. Aortic atherosclerosis. No pleural effusion or pneumothorax. Postsurgical change in the left humerus IMPRESSION: No active disease. Low lung volumes with cardiomegaly. Atelectasis or scar at the left base Electronically Signed   By: Jasmine Pang M.D.   On: 04/02/2021 22:06   DG Hip Unilat W or Wo Pelvis 2-3 Views Left  Result Date: 04/02/2021 CLINICAL DATA:  Acute left hip pain without known injury. EXAM: DG HIP (WITH OR WITHOUT PELVIS) 2-3V LEFT COMPARISON:  None. FINDINGS: There is no evidence of hip fracture or dislocation. Mild osteophyte formation is seen involving the left hip. IMPRESSION: Mild degenerative change of left hip.  No acute abnormality seen. Electronically Signed   By: Lupita Raider M.D.   On: 04/02/2021 16:42    Scheduled Meds: . enoxaparin (LOVENOX) injection  40 mg Subcutaneous Q24H  . gabapentin  300 mg Oral QHS  . insulin  aspart  0-5 Units Subcutaneous QHS  . insulin aspart  0-9 Units Subcutaneous TID WC  . levothyroxine  75 mcg Oral Q0600  . melatonin  5 mg Oral QHS  . methocarbamol  500 mg Oral BID   Continuous Infusions: . cefTRIAXone (ROCEPHIN)  IV Stopped (04/03/21 0003)     LOS: 0 days   Time spent: 40 minutes. More than 50% of the time was spent in counseling/coordination of care  Arnetha Courser, MD [facility]  If 7PM-7AM, please contact night-coverage [email or web address]  04/03/2021, 4:42 PM   This record has been created using Conservation officer, historic buildings. Errors have been sought and corrected,but may not always be  located. Such creation errors do not reflect on the standard of care.

## 2021-04-03 NOTE — Progress Notes (Signed)
Inpatient Diabetes Program Recommendations  AACE/ADA: New Consensus Statement on Inpatient Glycemic Control (2015)  Target Ranges:  Prepandial:   less than 140 mg/dL      Peak postprandial:   less than 180 mg/dL (1-2 hours)      Critically ill patients:  140 - 180 mg/dL   Lab Results  Component Value Date   GLUCAP 227 (H) 04/03/2021   HGBA1C 6.9 (H) 04/02/2021    Review of Glycemic Control Results for Cynthia Landry, Cynthia Landry (MRN [id number]) as of 04/03/2021 12:40  Ref. Range 04/03/2021 00:29 04/03/2021 07:46 04/03/2021 11:35  Glucose-Capillary Latest Ref Range: 70 - 99 mg/dL 601 (H) 561 (H) 537 (H)   Diabetes history: DM 2 Outpatient Diabetes medications:  Lantus 10 units q HS Humalog 5 units tid with meals Metformin 500 mg daily Current orders for Inpatient glycemic control:  Novolog sensitive tid with meals and HS Inpatient Diabetes Program Recommendations:   May consider adding Novolog 3 units tid with meals (hold if patient eats less than 50% or NPO).   Thanks, Beryl Meager, RN, BC-ADM Inpatient Diabetes Coordinator Pager [phone] (8a-5p)

## 2021-04-03 NOTE — Sepsis Progress Note (Signed)
Notified bedside nurse of need to draw repeat lactic acid. 

## 2021-04-04 DIAGNOSIS — M79605 Pain in left leg: Secondary | ICD-10-CM | POA: Diagnosis not present

## 2021-04-04 DIAGNOSIS — G1221 Amyotrophic lateral sclerosis: Secondary | ICD-10-CM | POA: Diagnosis not present

## 2021-04-04 DIAGNOSIS — R1310 Dysphagia, unspecified: Secondary | ICD-10-CM | POA: Diagnosis not present

## 2021-04-04 DIAGNOSIS — R000 Tachycardia, unspecified: Secondary | ICD-10-CM | POA: Diagnosis not present

## 2021-04-04 LAB — RESP PANEL BY RT-PCR (FLU A&B, COVID) ARPGX2
Influenza A by PCR: NEGATIVE
Influenza B by PCR: NEGATIVE
SARS Coronavirus 2 by RT PCR: NEGATIVE

## 2021-04-04 LAB — GLUCOSE, CAPILLARY
Glucose-Capillary: 119 mg/dL — ABNORMAL HIGH (ref 70–99)
Glucose-Capillary: 176 mg/dL — ABNORMAL HIGH (ref 70–99)
Glucose-Capillary: 210 mg/dL — ABNORMAL HIGH (ref 70–99)

## 2021-04-04 LAB — PROCALCITONIN: Procalcitonin: <0.1 ng/mL

## 2021-04-04 MED ORDER — HYDROCODONE-ACETAMINOPHEN 5-325 MG PO TABS: 1.0000 | ORAL_TABLET | ORAL | 0 refills | Status: DC | PRN

## 2021-04-04 MED ORDER — FUROSEMIDE 80 MG PO TABS: 40.0000 mg | ORAL_TABLET | Freq: Every day | ORAL | 0 refills | Status: DC

## 2021-04-04 NOTE — Discharge Summary (Signed)
Physician Discharge Summary  Cynthia Landry HKV:[id number] DOB: Sep 22, 1956 DOA: 04/02/2021  PCP: Patient, No Pcp Per (Inactive)  Admit date: 04/02/2021 Discharge date: 04/04/2021  Admitted From: SNF Disposition: SNF  Recommendations for Outpatient Follow-up:  1. Follow up with PCP in 1-2 weeks 2. Follow-up with neurology 3. Please obtain BMP/CBC in one week 4. Please follow up on the following pending results: None  Home Health: No Equipment/Devices: Home oxygen Discharge Condition: Stable CODE STATUS: DNR Diet recommendation:  Dysphagia   Brief/Interim Summary: Gardiner Fanti a 65 y.o.femalewith medical history significant forHTN, hypothyroidism, chronic hepatitis C, depression, ALS, who is bedbound, with dysphagia and who was sent to the emergency room for evaluation of left thigh pain. There has been no reported injury or fall and no aggressive transfers. Patient denies rash, insect bites, or warm or cold feeling in the leg. Sensation and leg is not worse than baseline. Denies abdominal pain, nausea vomiting or diarrhea or dysuria and denies vaginal pain or discharge. Patient was having leukocytosis and imaging was without any acute abnormality or DVT. Did become hypotensive with blood pressure of 97/58 and mildly tachycardic in ED and code sepsis was called and she was started on vancomycin and Rocephin. There was some concern of cellulitis but apparently there is no skin lesion consistent with cellulitis. Mildly positive lactic acid which has been resolved.  UA does not look impressive, procalcitonin negative, chest x-ray without any acute abnormality.  Blood cultures negative. Sepsis ruled out as there was no obvious source of infection. Patient received 3 days of ceftriaxone.  Her left leg pain is most likely neuropathic with her history of advanced ALS and bilateral feet contractures.  Imaging without any acute abnormality.  Venous Doppler negative for DVT.  Pain  improved with Norco along with muscle relaxant.  Patient was given some Norco and can continue her home dose of Flexeril with that as needed for pain.  She will get benefit by seeing her neurologist for further recommendations.  Patient will continue with her dysphagia diet as recommended by her neurologist.  We initially held her home antihypertensives due to transient hypotension which can be resumed on discharge.  Patient will continue with rest of her home medications and follow-up with her providers.  Discharge Diagnoses:  Active Problems:   Dysphagia   Intractable pain, left leg   ALS (amyotrophic lateral sclerosis) (HCC)   Chronic hepatitis C (HCC)   Hypothyroidism   Hypertension, benign   Leukocytosis   Type 2 diabetes mellitus without complication (HCC)   COPD (chronic obstructive pulmonary disease) (HCC)   Left leg pain   Sinus tachycardia   Discharge Instructions  Discharge Instructions    Diet - low sodium heart healthy   Complete by: As directed    Increase activity slowly   Complete by: As directed      Allergies as of 04/04/2021      Reactions   Aspirin Other (See Comments)   dizziness   Hctz [hydrochlorothiazide] Other (See Comments)   cramping   Lisinopril Cough      Medication List    STOP taking these medications   brinzolamide 1 % ophthalmic suspension Commonly known as: AZOPT   cephALEXin 500 MG capsule Commonly known as: KEFLEX     TAKE these medications   acetaminophen 500 MG tablet Commonly known as: TYLENOL Take 1,000 mg by mouth every 6 (six) hours as needed for mild pain or moderate pain. *not to exceed 4 grams/24 hours*  atorvastatin 10 MG tablet Commonly known as: LIPITOR Take 1 tablet by mouth daily. What changed: Another medication with the same name was removed. Continue taking this medication, and follow the directions you see here.   bisacodyl 10 MG suppository Commonly known as: DULCOLAX Place 10 mg rectally daily as  needed for mild constipation, moderate constipation or severe constipation.   Calcium 500 MG tablet Take 500 mg by mouth daily.   Cranberry 450 MG Tabs Take 1 tablet by mouth daily.   cyclobenzaprine 5 MG tablet Commonly known as: FLEXERIL Take 5 mg by mouth 3 (three) times daily.   divalproex 250 MG DR tablet Commonly known as: DEPAKOTE Take 250 mg by mouth daily as needed.   Docusate Sodium 100 MG capsule Take 100 mg by mouth every 12 (twelve) hours as needed for constipation.   DULoxetine 30 MG capsule Commonly known as: CYMBALTA Take 30 mg by mouth every morning.   ergocalciferol 1.25 MG (50000 UT) capsule Commonly known as: VITAMIN D2 Take 50,000 Units by mouth once a week.   furosemide 80 MG tablet Commonly known as: LASIX Take 0.5 tablets (40 mg total) by mouth daily. What changed: how much to take   gabapentin 300 MG capsule Commonly known as: NEURONTIN Take 300 mg by mouth at bedtime.   HYDROcodone-acetaminophen 5-325 MG tablet Commonly known as: NORCO/VICODIN Take 1-2 tablets by mouth every 4 (four) hours as needed for moderate pain or severe pain.   insulin glargine 100 UNIT/ML injection Commonly known as: LANTUS Inject 10 Units into the skin at bedtime.   insulin lispro 100 UNIT/ML injection Commonly known as: HUMALOG Inject 5 Units into the skin 3 (three) times daily before meals.   Ipratropium-Albuterol 20-100 MCG/ACT Aers respimat Commonly known as: COMBIVENT Inhale 1 puff into the lungs every 6 (six) hours.   latanoprost 0.005 % ophthalmic solution Commonly known as: XALATAN SMARTSIG:In Eye(s)   levothyroxine 150 MCG tablet Commonly known as: SYNTHROID Take 150 mcg by mouth daily. What changed: Another medication with the same name was removed. Continue taking this medication, and follow the directions you see here.   loratadine 10 MG tablet Commonly known as: CLARITIN Take by mouth.   Melatonin 3 MG Subl Place 3 mg under the tongue.  qhs   metFORMIN 500 MG tablet Commonly known as: GLUCOPHAGE Take 500 mg by mouth daily.   nystatin cream Commonly known as: MYCOSTATIN Apply topically.   omeprazole 20 MG capsule Commonly known as: PRILOSEC Take 20 mg by mouth. Give on days Monday, Wed, Friday   polyethylene glycol powder 17 GM/SCOOP powder Commonly known as: GLYCOLAX/MIRALAX Take by mouth.   potassium chloride SA 20 MEQ tablet Commonly known as: KLOR-CON Take 20 mEq by mouth 2 (two) times daily. Take with food.       Allergies  Allergen Reactions  . Aspirin Other (See Comments)    dizziness  . Hctz [Hydrochlorothiazide] Other (See Comments)    cramping  . Lisinopril Cough    Consultations:  None  Procedures/Studies: CT ABDOMEN PELVIS WO CONTRAST  Result Date: 04/02/2021 CLINICAL DATA:  Evaluate for diverticulitis. EXAM: CT ABDOMEN AND PELVIS WITHOUT CONTRAST TECHNIQUE: Multidetector CT imaging of the abdomen and pelvis was performed following the standard protocol without IV contrast. COMPARISON:  11/11/2015 FINDINGS: Lower chest: Scar versus subsegmental atelectasis identified in the left base. Hepatobiliary: No focal liver abnormality. High attenuation material layering in the dependent portion of the gallbladder consistent with stones and or sludge. No signs of gallbladder  wall inflammation. No bile duct dilatation. Pancreas: Unremarkable. No pancreatic ductal dilatation or surrounding inflammatory changes. Spleen: Normal in size without focal abnormality. Adrenals/Urinary Tract: Normal adrenal glands. No kidney mass or hydronephrosis identified. No kidney stones identified bilaterally. The urinary bladder is unremarkable. Stomach/Bowel: The stomach appears moderately distended with air-fluid level. The appendix is not confidently identified and may be surgically absent. There is a moderate amount of retained stool identified within the sigmoid colon and rectum. No signs of acute diverticulitis.  Vascular/Lymphatic: Aortic atherosclerosis. No aneurysm. No abdominopelvic adenopathy. Reproductive: Uterus and bilateral adnexa are unremarkable. Other: No abdominal wall hernia or abnormality. No abdominopelvic ascites. Musculoskeletal: There are mild, age indeterminate superior endplate deformities involving the L3 and L4 vertebra. These are new when compared with 11/11/2015. IMPRESSION: 1. No acute findings identified within the abdomen or pelvis. No evidence for acute diverticulitis. 2. Moderate amount of retained stool identified within the sigmoid colon and rectum. Correlate for any clinical signs or symptoms of constipation. 3. Gallstones and/or sludge. 4. Age-indeterminate L3 and L4 superior endplate deformities. 5. Aortic atherosclerosis. Aortic Atherosclerosis (ICD10-I70.0). Electronically Signed   By: Signa Kellaylor  Stroud M.D.   On: 04/02/2021 18:49   US Venous Img Lower Unilateral Left  Result Date: 04/02/2021 CLINICAL DATA:  Bed-bound patient.  Atraumatic lower extremity pain EXAM: LEFT LOWER EXTREMITY VENOUS DOPPLER ULTRASOUND TECHNIQUE: Gray-scale sonography with compression, as well as color and duplex ultrasound, were performed to evaluate the deep venous system(s) from the level of the common femoral vein through the popliteal and proximal calf veins. COMPARISON:  None. FINDINGS: VENOUS Normal compressibility of the common femoral, superficial femoral, and popliteal veins, as well as the visualized calf veins. Visualized portions of profunda femoral vein and great saphenous vein unremarkable. No filling defects to suggest DVT on grayscale or color Doppler imaging. Doppler waveforms show normal direction of venous flow, normal respiratory plasticity and response to augmentation. Limited views of the contralateral common femoral vein are unremarkable. OTHER None. Limitations: none IMPRESSION: No evidence LEFT lower extremity deep venous thrombosis. Electronically Signed   By: Genevive BiStewart  Edmunds M.D.    On: 04/02/2021 16:48   DG Chest Port 1 View  Result Date: 04/02/2021 CLINICAL DATA:  Sepsis EXAM: PORTABLE CHEST 1 VIEW COMPARISON:  11/11/2015 FINDINGS: Low lung volumes with linear scarring or atelectasis left base. Cardiomegaly with central bronchovascular crowding. Aortic atherosclerosis. No pleural effusion or pneumothorax. Postsurgical change in the left humerus IMPRESSION: No active disease. Low lung volumes with cardiomegaly. Atelectasis or scar at the left base Electronically Signed   By: Jasmine PangKim  Fujinaga M.D.   On: 04/02/2021 22:06   DG Hip Unilat W or Wo Pelvis 2-3 Views Left  Result Date: 04/02/2021 CLINICAL DATA:  Acute left hip pain without known injury. EXAM: DG HIP (WITH OR WITHOUT PELVIS) 2-3V LEFT COMPARISON:  None. FINDINGS: There is no evidence of hip fracture or dislocation. Mild osteophyte formation is seen involving the left hip. IMPRESSION: Mild degenerative change of left hip.  No acute abnormality seen. Electronically Signed   By: Lupita RaiderJames  Green Jr M.D.   On: 04/02/2021 16:42     Subjective: Patient was seen and examined today.  Able to speak 1 or 2 words.  Continue to have some left leg pain, nodding yes when asked whether it is improving.  No other complaint.  She wants to go back to her facility.  Discharge Exam: Vitals:   04/04/21 0436 04/04/21 0757  BP: 101/60 130/82  Pulse: 97 (!) 110  Resp:  16  Temp:  98.7 F (37.1 C)  SpO2:  100%   Vitals:   04/03/21 2232 04/04/21 0430 04/04/21 0436 04/04/21 0757  BP: 104/70 (!) 92/59 101/60 130/82  Pulse: 98 98 97 (!) 110  Resp:  20  16  Temp:  98.5 F (36.9 C)  98.7 F (37.1 C)  TempSrc:  Oral  Oral  SpO2:  100%  100%  Weight:  72.9 kg    Height:        General: Pt is alert, awake, not in acute distress Cardiovascular: RRR, S1/S2 +, no rubs, no gallops Respiratory: CTA bilaterally, no wheezing, no rhonchi Abdominal: Soft, NT, ND, bowel sounds + Extremities: no edema, no cyanosis, bilateral feet  contractures.   The results of significant diagnostics from this hospitalization (including imaging, microbiology, ancillary and laboratory) are listed below for reference.    Microbiology: Recent Results (from the past 240 hour(s))  MRSA PCR Screening     Status: None   Collection Time: 04/02/21  1:25 AM   Specimen: Nasopharyngeal  Result Value Ref Range Status   MRSA by PCR NEGATIVE NEGATIVE Final    Comment:        The GeneXpert MRSA Assay (FDA approved for NASAL specimens only), is one component of a comprehensive MRSA colonization surveillance program. It is not intended to diagnose MRSA infection nor to guide or monitor treatment for MRSA infections. Performed at [facility], [address] [place], [place] [address]   SARS CORONAVIRUS 2 (TAT 6-24 HRS) Nasopharyngeal Nasopharyngeal Swab     Status: None   Collection Time: 04/02/21  7:29 PM   Specimen: Nasopharyngeal Swab  Result Value Ref Range Status   SARS Coronavirus 2 NEGATIVE NEGATIVE Final    Comment: (NOTE) SARS-CoV-2 target nucleic acids are NOT DETECTED.  The SARS-CoV-2 RNA is generally detectable in upper and lower respiratory specimens during the acute phase of infection. Negative results do not preclude SARS-CoV-2 infection, do not rule out co-infections with other pathogens, and should not be used as the sole basis for treatment or other patient management decisions. Negative results must be combined with clinical observations, patient history, and epidemiological information. The expected result is Negative.  Fact Sheet for Patients: HairSlick.no  Fact Sheet for Healthcare Providers: quierodirigir.com  This test is not yet approved or cleared by the [place] FDA and  has been authorized for detection and/or diagnosis of SARS-CoV-2 by FDA under an Emergency Use Authorization (EUA). This EUA will remain  in effect (meaning this  test can be used) for the duration of the COVID-19 declaration under Se ction 564(b)(1) of the Act, 21 U.S.C. section 360bbb-3(b)(1), unless the authorization is terminated or revoked sooner.  Performed at [facility], [address], [place], [place] [address]   Culture, blood (x 2)     Status: None (Preliminary result)   Collection Time: 04/02/21  9:40 PM   Specimen: BLOOD  Result Value Ref Range Status   Specimen Description BLOOD BLOOD LEFT HAND  Final   Special Requests   Final    BOTTLES DRAWN AEROBIC AND ANAEROBIC Blood Culture adequate volume   Culture   Final    NO GROWTH < 24 HOURS Performed at [facility], [address] [place], [place] [address]    Report Status PENDING  Incomplete  Culture, blood (x 2)     Status: None (Preliminary result)   Collection Time: 04/02/21  9:41 PM   Specimen: BLOOD  Result Value  Ref Range Status   Specimen Description BLOOD BLOOD LEFT HAND  Final   Special Requests   Final    BOTTLES DRAWN AEROBIC AND ANAEROBIC Blood Culture adequate volume   Culture   Final    NO GROWTH < 24 HOURS Performed at [facility], [address] [place], [place] [address]    Report Status PENDING  Incomplete     Labs: BNP (last 3 results) No results for input(s): BNP in the last 8760 hours. Basic Metabolic Panel: Recent Labs  Lab 04/02/21 1654 04/03/21 0037  NA 141 139  K 4.0 3.7  CL 101 103  CO2 28 27  GLUCOSE 225* 176*  BUN 10 9  CREATININE 0.58 0.43*  CALCIUM 8.6* 8.0*   Liver Function Tests: No results for input(s): AST, ALT, ALKPHOS, BILITOT, PROT, ALBUMIN in the last 168 hours. No results for input(s): LIPASE, AMYLASE in the last 168 hours. No results for input(s): AMMONIA in the last 168 hours. CBC: Recent Labs  Lab 04/02/21 1654 04/03/21 0037  WBC 14.4* 13.1*  NEUTROABS 7.9*  --   HGB 14.5 13.5  HCT 47.1* 43.1  MCV 93.8 94.1  PLT 390 345   Cardiac Enzymes: Recent Labs  Lab 04/02/21 1654   CKTOTAL 201   BNP: Invalid input(s): POCBNP CBG: Recent Labs  Lab 04/03/21 0746 04/03/21 1135 04/03/21 1722 04/03/21 2121 04/04/21 0757  GLUCAP 123* 227* 158* 153* 119*   D-Dimer No results for input(s): DDIMER in the last 72 hours. Hgb A1c Recent Labs    04/02/21 1654  HGBA1C 6.9*   Lipid Profile No results for input(s): CHOL, HDL, LDLCALC, TRIG, CHOLHDL, LDLDIRECT in the last 72 hours. Thyroid function studies No results for input(s): TSH, T4TOTAL, T3FREE, THYROIDAB in the last 72 hours.  Invalid input(s): FREET3 Anemia work up No results for input(s): VITAMINB12, FOLATE, FERRITIN, TIBC, IRON, RETICCTPCT in the last 72 hours. Urinalysis    Component Value Date/Time   COLORURINE YELLOW (A) 04/03/2021 1205   APPEARANCEUR HAZY (A) 04/03/2021 1205   APPEARANCEUR Hazy 01/12/2013 1713   LABSPEC 1.023 04/03/2021 1205   LABSPEC 1.010 01/12/2013 1713   PHURINE 5.0 04/03/2021 1205   GLUCOSEU NEGATIVE 04/03/2021 1205   GLUCOSEU Negative 01/12/2013 1713   HGBUR NEGATIVE 04/03/2021 1205   BILIRUBINUR NEGATIVE 04/03/2021 1205   BILIRUBINUR Negative 01/12/2013 1713   KETONESUR NEGATIVE 04/03/2021 1205   PROTEINUR NEGATIVE 04/03/2021 1205   NITRITE NEGATIVE 04/03/2021 1205   LEUKOCYTESUR NEGATIVE 04/03/2021 1205   LEUKOCYTESUR Negative 01/12/2013 1713   Sepsis Labs Invalid input(s): PROCALCITONIN,  WBC,  LACTICIDVEN Microbiology Recent Results (from the past 240 hour(s))  MRSA PCR Screening     Status: None   Collection Time: 04/02/21  1:25 AM   Specimen: Nasopharyngeal  Result Value Ref Range Status   MRSA by PCR NEGATIVE NEGATIVE Final    Comment:        The GeneXpert MRSA Assay (FDA approved for NASAL specimens only), is one component of a comprehensive MRSA colonization surveillance program. It is not intended to diagnose MRSA infection nor to guide or monitor treatment for MRSA infections. Performed at [facility], [address]  [place], [place] [address]   SARS CORONAVIRUS 2 (TAT 6-24 HRS) Nasopharyngeal Nasopharyngeal Swab     Status: None   Collection Time: 04/02/21  7:29 PM   Specimen: Nasopharyngeal Swab  Result Value Ref Range Status   SARS Coronavirus 2 NEGATIVE NEGATIVE Final    Comment: (NOTE) SARS-CoV-2 target  nucleic acids are NOT DETECTED.  The SARS-CoV-2 RNA is generally detectable in upper and lower respiratory specimens during the acute phase of infection. Negative results do not preclude SARS-CoV-2 infection, do not rule out co-infections with other pathogens, and should not be used as the sole basis for treatment or other patient management decisions. Negative results must be combined with clinical observations, patient history, and epidemiological information. The expected result is Negative.  Fact Sheet for Patients: HairSlick.no  Fact Sheet for Healthcare Providers: quierodirigir.com  This test is not yet approved or cleared by the [place] FDA and  has been authorized for detection and/or diagnosis of SARS-CoV-2 by FDA under an Emergency Use Authorization (EUA). This EUA will remain  in effect (meaning this test can be used) for the duration of the COVID-19 declaration under Se ction 564(b)(1) of the Act, 21 U.S.C. section 360bbb-3(b)(1), unless the authorization is terminated or revoked sooner.  Performed at [facility], [address], [place], [place] [address]   Culture, blood (x 2)     Status: None (Preliminary result)   Collection Time: 04/02/21  9:40 PM   Specimen: BLOOD  Result Value Ref Range Status   Specimen Description BLOOD BLOOD LEFT HAND  Final   Special Requests   Final    BOTTLES DRAWN AEROBIC AND ANAEROBIC Blood Culture adequate volume   Culture   Final    NO GROWTH < 24 HOURS Performed at [facility], [address], [place], [place] [address]    Report Status PENDING  Incomplete   Culture, blood (x 2)     Status: None (Preliminary result)   Collection Time: 04/02/21  9:41 PM   Specimen: BLOOD  Result Value Ref Range Status   Specimen Description BLOOD BLOOD LEFT HAND  Final   Special Requests   Final    BOTTLES DRAWN AEROBIC AND ANAEROBIC Blood Culture adequate volume   Culture   Final    NO GROWTH < 24 HOURS Performed at [facility], [address], [place], [place] [address]    Report Status PENDING  Incomplete    Time coordinating discharge: Over 30 minutes  SIGNED:  Arnetha Courser, MD  [facility] 04/04/2021, 11:35 AM  If 7PM-7AM, please contact night-coverage [email or web address]  This record has been created using Conservation officer, historic buildings. Errors have been sought and corrected,but may not always be located. Such creation errors do not reflect on the standard of care.

## 2021-04-04 NOTE — [place] FL2 (Signed)
[place] MEDICAID FL2 LEVEL OF CARE SCREENING TOOL     IDENTIFICATION  Patient Name: Cynthia Landry Birthdate: 1956-11-09 Sex: female Admission Date (Current Location): 04/02/2021  [place] and [place] Number:  Chiropodist and Address:  [facility], [address] [place], [place] [address]      Provider Number: [id number]  Attending Physician Name and Address:  Arnetha Courser, MD  Relative Name and Phone Number:  Debbe Odea (daughter) [phone]    Current Level of Care: Hospital Recommended Level of Care: Skilled Nursing Facility Prior Approval Number:    Date Approved/Denied:   PASRR Number: [id number]  Discharge Plan: SNF    Current Diagnoses: Patient Active Problem List   Diagnosis Date Noted  . Left leg pain   . Sinus tachycardia   . Intractable pain, left leg 04/02/2021  . Leukocytosis 04/02/2021  . Type 2 diabetes mellitus without complication (HCC) 04/02/2021  . COPD (chronic obstructive pulmonary disease) (HCC) 04/02/2021  . Debility 02/24/2019  . Multifactorial functional impairment 12/30/2018  . Palliative care encounter 12/30/2018  . Dysphagia 12/30/2018  . Metabolic acidosis 11/11/2015  . Depression 12/06/2013  . ALS (amyotrophic lateral sclerosis) (HCC) 02/25/2013  . Chronic hepatitis C (HCC) 06/07/2011  . Hypertension, benign 02/27/2011  . Hypothyroidism 10/23/2010    Orientation RESPIRATION BLADDER Height & Weight     Self,Time,Situation,Place  O2 (1L nasal cannula) Continent,External catheter Weight: 160 lb 11.2 oz (72.9 kg) Height:  5\' 2"  (157.5 cm)  BEHAVIORAL SYMPTOMS/MOOD NEUROLOGICAL BOWEL NUTRITION STATUS      Continent Diet (see discharge summary)  AMBULATORY STATUS COMMUNICATION OF NEEDS Skin   Extensive Assist Verbally Normal                       Personal Care Assistance Level of Assistance  Bathing,Dressing,Feeding Bathing Assistance: Maximum assistance Feeding assistance: Limited  assistance       Functional Limitations Info  Sight,Hearing,Speech Sight Info: Adequate Hearing Info: Adequate Speech Info: Impaired    SPECIAL CARE FACTORS FREQUENCY  PT (By licensed PT),OT (By licensed OT)     PT Frequency: min 4x weekly OT Frequency: min 4x weekly            Contractures Contractures Info: Not present    Additional Factors Info  Code Status,Allergies Code Status Info: DNR Allergies Info: Aspirin, hydrochlorothiazide, lisinopril           Current Medications (04/04/2021):  This is the current hospital active medication list Current Facility-Administered Medications  Medication Dose Route Frequency Provider Last Rate Last Admin  . acetaminophen (TYLENOL) tablet 650 mg  650 mg Oral Q6H PRN 04/06/2021, MD       Or  . acetaminophen (TYLENOL) suppository 650 mg  650 mg Rectal Q6H PRN Andris Baumann, MD      . cefTRIAXone (ROCEPHIN) 1 g in sodium chloride 0.9 % 100 mL IVPB  1 g Intravenous Q24H Andris Baumann V, MD 200 mL/hr at 04/03/21 2243 1 g at 04/03/21 2243  . enoxaparin (LOVENOX) injection 40 mg  40 mg Subcutaneous Q24H 2244, MD   40 mg at 04/03/21 2238  . gabapentin (NEURONTIN) capsule 300 mg  300 mg Oral QHS 2239, MD   300 mg at 04/03/21 2238  . HYDROcodone-acetaminophen (NORCO/VICODIN) 5-325 MG per tablet 1-2 tablet  1-2 tablet Oral Q4H PRN 2239, MD   2 tablet at 04/03/21 2238  . insulin aspart (novoLOG) injection 0-5  Units  0-5 Units Subcutaneous QHS Lindajo Royal V, MD      . insulin aspart (novoLOG) injection 0-9 Units  0-9 Units Subcutaneous TID WC Andris Baumann, MD   2 Units at 04/03/21 1808  . levothyroxine (SYNTHROID) tablet 75 mcg  75 mcg Oral Q0600 Andris Baumann, MD   75 mcg at 04/04/21 0538  . melatonin tablet 5 mg  5 mg Oral QHS Tressie Ellis, RPH   5 mg at 04/03/21 2238  . methocarbamol (ROBAXIN) tablet 500 mg  500 mg Oral BID Arnetha Courser, MD   500 mg at 04/04/21 0856  . morphine 2 MG/ML  injection 2 mg  2 mg Intravenous Q2H PRN Andris Baumann, MD   2 mg at 04/03/21 1809  . ondansetron (ZOFRAN) tablet 4 mg  4 mg Oral Q6H PRN Andris Baumann, MD       Or  . ondansetron [facility]) injection 4 mg  4 mg Intravenous Q6H PRN Andris Baumann, MD         Discharge Medications: Please see discharge summary for a list of discharge medications.  Relevant Imaging Results:  Relevant Lab Results:   Additional Information SSN: [id number]  Gildardo Griffes, LCSW

## 2021-04-04 NOTE — Progress Notes (Signed)
Report called to [facility], given to  - [facility]. Patient awaiting transportation via EMS at this time per Social work.

## 2021-04-04 NOTE — TOC Transition Note (Signed)
Transition of Care [facility]) - CM/SW Discharge Note   Patient Details  Name: Cynthia Landry MRN: [id number] Date of Birth: Apr 17, 1956  Transition of Care [facility]) CM/SW Contact:  Gildardo Griffes, LCSW Phone Number: 04/04/2021, 1:54 PM   Clinical Narrative:     Patient will DC to:  Health Care Anticipated DC date: 04/04/2021 Family notified:daughter Transport UX:[id number]  Per MD patient ready for DC to [facility] . RN, patient, patient's family, and facility notified of DC. Discharge Summary sent to facility. RN given number for report  [phone]. DC packet on chart. Ambulance transport requested for patient.  CSW signing off.  [place], [place] [phone]   Final next level of care: Skilled Nursing Facility Barriers to Discharge: No Barriers Identified   Patient Goals and CMS Choice Patient states their goals for this hospitalization and ongoing recovery are:: to go back to SNF CMS Medicare.gov Compare Post Acute Care list provided to:: Patient Choice offered to / list presented to : Patient  Discharge Placement PASRR number recieved: 04/04/21            Patient chooses bed at: [facility] Patient to be transferred to facility by: [facility]   Patient and family notified of of transfer: 04/04/21  Discharge Plan and Services     Post Acute Care Choice: Skilled Nursing Facility                               Social Determinants of Health (SDOH) Interventions     Readmission Risk Interventions No flowsheet data found.

## 2021-04-04 NOTE — TOC Initial Note (Signed)
Transition of Care [facility]) - Initial/Assessment Note    Patient Details  Name: Cynthia Landry MRN: [id number] Date of Birth: 11/20/1955  [facility]) CM/SW Contact:    Alberteen Sam, LCSW Phone Number: 04/04/2021, 10:17 AM  Clinical Narrative:                  CSW met with patient at bedside, confirmed patient is long term care at [facility]. Patient is agreeable to return when medically stable to dc.   CSW spoke with Lavella Lemons at [facility] who confirms patient is long term care and can return when ready to dc.   Expected Discharge Plan: Skilled Nursing Facility Barriers to Discharge: No Barriers Identified   Patient Goals and CMS Choice Patient states their goals for this hospitalization and ongoing recovery are:: to go back to [place] Medicare.gov Compare Post Acute Care list provided to:: Patient Choice offered to / list presented to : Patient  Expected Discharge Plan and Services Expected Discharge Plan: [place] Choice: [place] Living arrangements for the past 2 months: [place] ([place] [facility])                                      Prior Living Arrangements/Services Living arrangements for the past 2 months: [place] [facility]) Lives with:: Self Patient language and need for interpreter reviewed:: Yes        Need for Family Participation in Patient Care: Yes (Comment) Care giver support system in place?: Yes (comment)   Criminal Activity/Legal Involvement Pertinent to Current Situation/Hospitalization: No - Comment as needed  Activities of Daily Living Home Assistive Devices/Equipment: Oxygen ADL Screening (condition at time of admission) Patient's cognitive ability adequate to safely complete daily activities?: No Is the patient deaf or have difficulty hearing?: No Does the patient have difficulty seeing,  even when wearing glasses/contacts?: No Does the patient have difficulty concentrating, remembering, or making decisions?: No Patient able to express need for assistance with ADLs?: No Does the patient have difficulty dressing or bathing?: Yes Independently performs ADLs?: No Communication: Independent Dressing (OT): Dependent,Needs assistance Is this a change from baseline?: Pre-admission baseline Grooming: Dependent,Needs assistance Is this a change from baseline?: Pre-admission baseline Feeding: Needs assistance Is this a change from baseline?: Pre-admission baseline Bathing: Needs assistance Is this a change from baseline?: Pre-admission baseline Toileting: Needs assistance Is this a change from baseline?: Pre-admission baseline In/Out Bed: Dependent Is this a change from baseline?: Pre-admission baseline Walks in Home: Dependent Is this a change from baseline?: Pre-admission baseline Does the patient have difficulty walking or climbing stairs?: Yes Weakness of Legs: Both Weakness of Arms/Hands: Both  Permission Sought/Granted   Permission granted to share information with : Yes, Verbal Permission Granted     Permission granted to share info w AGENCY: [place]        Emotional Assessment Appearance:: Appears stated age Attitude/Demeanor/Rapport: Gracious Affect (typically observed): Calm Orientation: : Oriented to Self,Oriented to Place,Oriented to  Time,Oriented to Situation Alcohol / Substance Use: Not Applicable Psych Involvement: No (comment)  Admission diagnosis:  Sinus tachycardia [R00.0] Left leg pain [M79.605] Intractable pain [R52] Sepsis ([place]) [A41.9] Patient Active Problem List   Diagnosis Date Noted  . Left leg pain   . Sinus tachycardia   . Intractable pain,  left leg 04/02/2021  . Leukocytosis 04/02/2021  . Type 2 diabetes mellitus without complication ([place]) 47/34/0370  . COPD (chronic obstructive pulmonary disease) ([place]) 04/02/2021  .  Debility 02/24/2019  . Multifactorial functional impairment 12/30/2018  . Palliative care encounter 12/30/2018  . Dysphagia 12/30/2018  . Metabolic acidosis 96/43/8381  . Depression 12/06/2013  . ALS (amyotrophic lateral sclerosis) ([place]) 02/25/2013  . Chronic hepatitis C ([place]) 06/07/2011  . Hypertension, benign 02/27/2011  . Hypothyroidism 10/23/2010   PCP:  Patient, No Pcp Per (Inactive) Pharmacy:  No Pharmacies Listed    Social Determinants of Health (SDOH) Interventions    Readmission Risk Interventions No flowsheet data found.

## 2021-04-04 NOTE — Plan of Care (Signed)
  Problem: Education: Goal: Knowledge of General Education information will improve Description: Including pain rating scale, medication(s)/side effects and non-pharmacologic comfort measures 04/04/2021 1444 by Jerene Dilling, RN Outcome: Adequate for Discharge 04/04/2021 1444 by Jerene Dilling, RN Outcome: Adequate for Discharge   Problem: Health Behavior/Discharge Planning: Goal: Ability to manage health-related needs will improve 04/04/2021 1444 by Jerene Dilling, RN Outcome: Adequate for Discharge 04/04/2021 1444 by Jerene Dilling, RN Outcome: Adequate for Discharge   Problem: Clinical Measurements: Goal: Ability to maintain clinical measurements within normal limits will improve 04/04/2021 1444 by Edison Pace D, RN Outcome: Adequate for Discharge 04/04/2021 1444 by Jerene Dilling, RN Outcome: Adequate for Discharge Goal: Will remain free from infection 04/04/2021 1444 by Edison Pace D, RN Outcome: Adequate for Discharge 04/04/2021 1444 by Jerene Dilling, RN Outcome: Adequate for Discharge Goal: Diagnostic test results will improve 04/04/2021 1444 by Jerene Dilling, RN Outcome: Adequate for Discharge 04/04/2021 1444 by Jerene Dilling, RN Outcome: Adequate for Discharge Goal: Respiratory complications will improve 04/04/2021 1444 by Jerene Dilling, RN Outcome: Adequate for Discharge 04/04/2021 1444 by Jerene Dilling, RN Outcome: Adequate for Discharge Goal: Cardiovascular complication will be avoided 04/04/2021 1444 by Edison Pace D, RN Outcome: Adequate for Discharge 04/04/2021 1444 by Jerene Dilling, RN Outcome: Adequate for Discharge   Problem: Activity: Goal: Risk for activity intolerance will decrease 04/04/2021 1444 by Jerene Dilling, RN Outcome: Adequate for Discharge 04/04/2021 1444 by Jerene Dilling, RN Outcome: Adequate for Discharge   Problem: Nutrition: Goal: Adequate nutrition will be maintained 04/04/2021 1444 by Jerene Dilling,  RN Outcome: Adequate for Discharge 04/04/2021 1444 by Jerene Dilling, RN Outcome: Adequate for Discharge   Problem: Elimination: Goal: Will not experience complications related to bowel motility 04/04/2021 1444 by Jerene Dilling, RN Outcome: Adequate for Discharge 04/04/2021 1444 by Jerene Dilling, RN Outcome: Adequate for Discharge Goal: Will not experience complications related to urinary retention 04/04/2021 1444 by Jerene Dilling, RN Outcome: Adequate for Discharge 04/04/2021 1444 by Jerene Dilling, RN Outcome: Adequate for Discharge   Problem: Pain Managment: Goal: General experience of comfort will improve 04/04/2021 1444 by Jerene Dilling, RN Outcome: Adequate for Discharge 04/04/2021 1444 by Jerene Dilling, RN Outcome: Adequate for Discharge   Problem: Safety: Goal: Ability to remain free from injury will improve 04/04/2021 1444 by Jerene Dilling, RN Outcome: Adequate for Discharge 04/04/2021 1444 by Jerene Dilling, RN Outcome: Adequate for Discharge   Problem: Skin Integrity: Goal: Risk for impaired skin integrity will decrease 04/04/2021 1444 by Jerene Dilling, RN Outcome: Adequate for Discharge 04/04/2021 1444 by Jerene Dilling, RN Outcome: Adequate for Discharge

## 2021-04-07 LAB — CULTURE, BLOOD (ROUTINE X 2)
Culture: NO GROWTH
Culture: NO GROWTH
Special Requests: ADEQUATE
Special Requests: ADEQUATE

## 2021-05-28 ENCOUNTER — Other Ambulatory Visit: Payer: Self-pay

## 2021-05-28 ENCOUNTER — Non-Acute Institutional Stay: Payer: Medicare Other | Admitting: Nurse Practitioner

## 2021-05-28 ENCOUNTER — Encounter: Payer: Self-pay | Admitting: Nurse Practitioner

## 2021-05-28 VITALS — BP 101/71 | HR 90 | Temp 97.5°F | Resp 18 | Wt 168.5 lb

## 2021-05-28 DIAGNOSIS — Z515 Encounter for palliative care: Secondary | ICD-10-CM

## 2021-05-28 DIAGNOSIS — R5381 Other malaise: Secondary | ICD-10-CM

## 2021-05-28 DIAGNOSIS — R1310 Dysphagia, unspecified: Secondary | ICD-10-CM

## 2021-05-28 NOTE — Progress Notes (Signed)
[facility] Palliative Care Consult Note Telephone: [phone]  Fax: [phone]    Date of encounter: 05/28/21 PATIENT NAME: Cynthia Landry [address] [place] [address]   [phone] (home)  DOB: Sep 20, 1956 MRN: [id number] PRIMARY CARE PROVIDER:   Dr Lucianne Lei [facility] RESPONSIBLE PARTY:    Contact Information     Name Relation Home Work Mobile   Tollette C Daughter [phone]        I met face to face with patient in facility. Palliative Care was asked to follow this patient by consultation request of  Dr Nona Dell to address advance care planning and complex medical decision making. This is a follow up visit. ASSESSMENT AND PLAN / RECOMMENDATIONS:   Symptom Management/Plan: 1. ACP: DNR, medical goals of care to focus on comfort, previously under hospice services but d/c due to stability, will revisit when declines   ASSESSMENT:     Palliative care 5 / 7 / 2019 until present Palliative care 11 / 21 / 2017 to 11 / 9 / 2018 Hospice 11 / 11 / 2018 to 5 / 1 / 2019   2. Dysphagia; secondary to ALS. Continue to monitor weights, appetite, aspiration precautions.    3. Debility secondary to ALS, encourage passive rom; encourage to transfer to geri-chair.    4. Palliative care encounter; Palliative medicine team will continue to support patient, patient's family, and medical team. Visit consisted of counseling and education dealing with the complex and emotionally intense issues of symptom management and palliative care in the setting of serious and potentially life-threatening illness   5. F/u 1 months for ongoing monitoring weight, dysphagia, ALS progression Follow up Palliative Care Visit: Palliative care will continue to follow for complex medical decision making, advance care planning, and clarification of goals. Return 6 weeks or prn.  I spent 40 minutes providing this consultation. More than 50% of the time in this  consultation was spent in counseling and care coordination.  PPS: 30%  HOSPICE ELIGIBILITY/DIAGNOSIS: TBD  Chief Complaint: Follow up Palliative consult for complex medical decision making  HISTORY OF PRESENT ILLNESS:  Cynthia Landry is a 65 y.o. year old female  with multiple medical problems including. ALS, dysphasia, hepatitis C, left humerus pathological fractures, muscle weakness. Cynthia Landry continues to reside at [place] [facility]. Cynthia Landry remains Bed-bound, total ADL dependents including bathing, dressing, toileting as she is incontinent of bowel and bladder. Cynthia Landry requires to be fed by staff. Last [facility] NP note 03/02/2021 for major depressive disorder managed with medication, medical goals focus on DNR, DNI, Do not hospitalize,  wishes are for lab tests, antibiotics, IVF. 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 Insomnia prescribed melatonin with no new changes. Recent hospitalization 04/02/2021 to 04/04/2021 for left thigh pain with workup pain likely neuropathic with h/o advanced ALS with bilateral feet contractures. Continue dysphagia diet recommended by neurologist. No other changes per staff. I visited and observed Cynthia Landry. We talked about purpose PC visit. Cynthia Landry made eye contact, agree. Cynthia Landry and I talked about how she has been feeling. Cynthia Landry endorses she has been doing fine. No worsening with dysphagia. Current weight 168.5 with BMI 32.9 with weight gain of 7.3 lbs. We talked about foods Cynthia Landry likes to eat. We talked about her daily routine. Cynthia Landry talked about her daughter visiting. We  talked about quality of life. Cynthia Landry endorses she wishes more visitors, social interactions. We talked about symptoms, currently asymptomatic. We talked about role PC in poc. Medical goals reviewed. No changes recommended. With weight gain will wait  on re-approaching hospice with daughter and Cynthia Landry. Therapeutic listening, emotional support provided. I attempted to contact daughter for update on pc visit and goc. I updated staff.  05/17/2021 sodium 138, potassium 4.3, chloride 98, co2 27, calcium 9.1, bun 13, creatinine 0.40, glucose 169, wbc 8.6, hgb 13.5, hct 42.4, platelets 242.    History obtained from review of EMR, discussion with facility staff and  Cynthia Landry.  I reviewed available labs, medications, imaging, studies and related documents from the EMR.  Records reviewed and summarized above.   ROS Full 14 system review of systems performed and negative with exception of: as per HPI.   Physical Exam: Constitutional: NAD General: paraplegic pleasant female EYES: lids intact ENMT: oral mucous membranes moist CV: S1S2, RRR Pulmonary: clear, decreased bases, no increased work of breathing, no cough, room air Abdomen: soft and non tender MSK: paraplegic Skin: warm and dry, no rashes or wounds on visible skin Neuro:  + generalized weakness,  no cognitive impairment Psych: non-anxious affect, A and O x 3  Questions and concerns were addressed. The facility staff was encouraged to call with questions and/or concerns. Provided general support and encouragement, no other unmet needs identified   Thank you for the opportunity to participate in the care of Cynthia Landry.  The palliative care team will continue to follow. Please call our office at [phone] if we can be of additional assistance.   This chart was dictated using voice recognition software.  Despite best efforts to proofread,  errors can occur which can change the documentation meaning.   Koston Hennes Ihor Gully, NP

## 2021-07-16 ENCOUNTER — Encounter: Payer: Self-pay | Admitting: Nurse Practitioner

## 2021-07-16 ENCOUNTER — Non-Acute Institutional Stay: Payer: Medicare Other | Admitting: Nurse Practitioner

## 2021-07-16 ENCOUNTER — Other Ambulatory Visit: Payer: Self-pay

## 2021-07-16 VITALS — BP 122/74 | HR 80 | Temp 97.5°F | Resp 18 | Wt 168.5 lb

## 2021-07-16 DIAGNOSIS — R1310 Dysphagia, unspecified: Secondary | ICD-10-CM

## 2021-07-16 DIAGNOSIS — R5381 Other malaise: Secondary | ICD-10-CM

## 2021-07-16 DIAGNOSIS — Z515 Encounter for palliative care: Secondary | ICD-10-CM

## 2021-07-16 DIAGNOSIS — G1221 Amyotrophic lateral sclerosis: Secondary | ICD-10-CM

## 2021-07-16 NOTE — Progress Notes (Signed)
[employer] Palliative Care Consult Note Telephone: [phone]  Fax: [phone]    Date of encounter: 07/16/21 5:17 PM PATIENT NAME: Cynthia Landry [address] [place] [address]   [phone] (home)  DOB: [birth year] MRN: [id number] PRIMARY CARE PROVIDER:    Dr Lucianne Lei [facility]  RESPONSIBLE PARTY:    Contact Information     Name Relation Home Work Mobile   Haynes C Daughter [phone]        I met face to face with patient in facility. Palliative Care was asked to follow this patient by consultation request of  Dr Nona Dell to address advance care planning and complex medical decision making. This is a follow up visit.                                  ASSESSMENT AND PLAN / RECOMMENDATIONS:   Symptom Management/Plan: ACP: DNR  Palliative care 5 / 7 / 2019 until present Palliative care 11 / 21 / 2017 to 11 / 9 / 2018 Hospice 11 / 11 / 2018 to 5 / 1 / 2019   2. Dysphagia; secondary to ALS. Continue to monitor weights, appetite, aspiration precautions.    3. Debility secondary to ALS, encourage passive rom; encourage to transfer to geri-chair.    4. Palliative care encounter; Palliative medicine team will continue to support patient, patient's family, and medical team. Visit consisted of counseling and education dealing with the complex and emotionally intense issues of symptom management and palliative care in the setting of serious and potentially life-threatening illness   5. F/u 1 months for ongoing monitoring weight, dysphagia, ALS progression Follow up Palliative Care Visit: Palliative care will continue to follow for complex medical decision making, advance care planning, and clarification of goals.   Follow up Palliative Care Visit: Palliative care will continue to follow for complex medical decision making, advance care planning, and clarification of goals. Return 4 weeks or prn.  I spent 41 minutes  providing this consultation. More than 50% of the time in this consultation was spent in counseling and care coordination. PPS: 30%  Chief Complaint: Follow up palliative consult for complex medical decision making  HISTORY OF PRESENT ILLNESS:  Cynthia Landry is a 65 y.o. year old female  with multiple medical problems including. ALS, dysphasia, hepatitis C, left humerus pathological fractures, muscle weakness. Cynthia Landry continues to reside at [place] [facility]. Cynthia Landry remains Bed-bound, total ADL dependents including bathing, dressing, toileting as she is incontinent of bowel and bladder. Cynthia Landry requires to be fed by staff. Last [employer] NP note 03/02/2021 for major depressive disorder managed with medication, medical goals focus on DNR, DNI, Do not hospitalize,  wishes are for lab tests, antibiotics, IVF. Followed by psychiatry with last visit 06/14/2021 for which she takes cymbalta and depakote for h/o MDD, major depression; melatonin for insomnia. Staff endorses no other changes. No recent wounds, falls, hospitalizations. Cynthia Landry is a DNR. At present, Cynthia Landry is lying in bed, appears debilitated, comfortable. No visitors present. I visited and observed Cynthia Landry. We talked about purpose of PC visit. Cynthia Landry in agreement. Cynthia Landry endorses she has overall not been feeling well, decrease appetite, food does not taste as good. Cynthia Landry denies symptoms of pain, shortness of breath, no worsening dysphagia. We talked about Cynthia Landry it has been  some time since she has been hoyered to a recliner. We talked about seeing if staff would get her oob, she declined. We talked about residing at [facility]. We talked about her concerns relying on others for for basic needs. Cynthia Landry and I talked about ALS, progression, concerns with disease progression. We talked about quality of life, what brings her joy. Cynthia Landry endorses she is very happy when her daughter, grandchildren  visit. We talked about support system as she has been at [facility] for several years. Medical goals reviewed. We talked about role pc in poc. I have attempted to reach daughter, I updated staff, no new changes recommended today.   History obtained from review of EMR, discussion with facility staff and Cynthia Landry.  I reviewed available labs, medications, imaging, studies and related documents from the EMR.  Records reviewed and summarized above.   ROS Full 14 system review of systems performed and negative with exception of: as per HPI.   Physical Exam Constitutional: NAD General: frail appearing, debilitated, bed bound pleasant female with difficulty with speech EYES: lids intact ENMT: oral mucous membranes moist CV: S1S2, RRR Pulmonary: LCTA, no increased work of breathing, no cough, Abdomen: normo-active BS + 4 quadrants, soft and non tender MSK: bed bound Skin: warm and dry Neuro:  + generalized weakness,  + mild cognitive impairment Psych: non-anxious affect, A and O x 3  Questions and concerns were addressed. Provided general support and encouragement, no other unmet needs identified   Thank you for the opportunity to participate in the care of Cynthia Landry.  The palliative care team will continue to follow. Please call our office at [phone] if we can be of additional assistance.   This chart was dictated using voice recognition software.  Despite best efforts to proofread,  errors can occur which can change the documentation meaning.   Bette Brienza Ihor Gully, NP

## 2021-08-09 ENCOUNTER — Other Ambulatory Visit: Payer: Self-pay

## 2021-08-09 ENCOUNTER — Ambulatory Visit: Payer: Medicare Other | Attending: Family | Admitting: Family

## 2021-08-09 ENCOUNTER — Encounter: Payer: Self-pay | Admitting: Family

## 2021-08-09 VITALS — BP 127/98 | HR 109 | Resp 18 | Ht 62.0 in

## 2021-08-09 DIAGNOSIS — F32A Depression, unspecified: Secondary | ICD-10-CM | POA: Insufficient documentation

## 2021-08-09 DIAGNOSIS — R1310 Dysphagia, unspecified: Secondary | ICD-10-CM | POA: Insufficient documentation

## 2021-08-09 DIAGNOSIS — Z7984 Long term (current) use of oral hypoglycemic drugs: Secondary | ICD-10-CM | POA: Diagnosis not present

## 2021-08-09 DIAGNOSIS — I5022 Chronic systolic (congestive) heart failure: Secondary | ICD-10-CM | POA: Diagnosis not present

## 2021-08-09 DIAGNOSIS — R059 Cough, unspecified: Secondary | ICD-10-CM | POA: Diagnosis present

## 2021-08-09 DIAGNOSIS — I10 Essential (primary) hypertension: Secondary | ICD-10-CM | POA: Diagnosis not present

## 2021-08-09 DIAGNOSIS — I110 Hypertensive heart disease with heart failure: Secondary | ICD-10-CM | POA: Insufficient documentation

## 2021-08-09 DIAGNOSIS — Z8249 Family history of ischemic heart disease and other diseases of the circulatory system: Secondary | ICD-10-CM | POA: Insufficient documentation

## 2021-08-09 DIAGNOSIS — F419 Anxiety disorder, unspecified: Secondary | ICD-10-CM | POA: Diagnosis not present

## 2021-08-09 DIAGNOSIS — Z79899 Other long term (current) drug therapy: Secondary | ICD-10-CM | POA: Insufficient documentation

## 2021-08-09 DIAGNOSIS — E785 Hyperlipidemia, unspecified: Secondary | ICD-10-CM | POA: Diagnosis not present

## 2021-08-09 DIAGNOSIS — Z7901 Long term (current) use of anticoagulants: Secondary | ICD-10-CM | POA: Insufficient documentation

## 2021-08-09 DIAGNOSIS — G1221 Amyotrophic lateral sclerosis: Secondary | ICD-10-CM

## 2021-08-09 DIAGNOSIS — Z794 Long term (current) use of insulin: Secondary | ICD-10-CM | POA: Diagnosis not present

## 2021-08-09 DIAGNOSIS — Z886 Allergy status to analgesic agent status: Secondary | ICD-10-CM | POA: Diagnosis not present

## 2021-08-09 DIAGNOSIS — Z888 Allergy status to other drugs, medicaments and biological substances status: Secondary | ICD-10-CM | POA: Insufficient documentation

## 2021-08-09 DIAGNOSIS — E079 Disorder of thyroid, unspecified: Secondary | ICD-10-CM | POA: Insufficient documentation

## 2021-08-09 DIAGNOSIS — Z7401 Bed confinement status: Secondary | ICD-10-CM | POA: Insufficient documentation

## 2021-08-09 DIAGNOSIS — R531 Weakness: Secondary | ICD-10-CM | POA: Insufficient documentation

## 2021-08-09 DIAGNOSIS — F329 Major depressive disorder, single episode, unspecified: Secondary | ICD-10-CM

## 2021-08-09 MED ORDER — CARVEDILOL 3.125 MG PO TABS: 3.1250 mg | ORAL_TABLET | Freq: Two times a day (BID) | ORAL | 5 refills | Status: DC

## 2021-08-09 NOTE — Progress Notes (Signed)
Patient ID: Cynthia Landry, female    DOB: December 24, 1955, 65 y.o.   MRN: [id number]  HPI  Cynthia Landry is a 65 y/o female with a history of hyperlipidemia, HTN, thyroid disease, ALS, anxiety, depression, hepatitis C, psychosis and heart failure.   Echo report (from [facility])  from 06/29/21 reviewed and showed an EF of 30%.  Admitted 04/02/21 due to left thigh pain. Doppler was negative for DVT. Became hypotensive along with tacycardia so code sepsis called and antibiotics given. Pain thought to be due to advanced ALS and bilateral feet contractures. Discharged after 2 days.   She presents today for her initial visit with a chief complaint of dry cough. This has been present for quite awhile. She has associated pedal edema, weakness (due to ALS), inability to stand and dysphagia along with this. She denies any dizziness, difficulty sleeping, abdominal distention, palpitations, chest pain or weight gain.   Had echo done through [facility] which showed reduced heart function. Currently on a liquid diet due to ileus but normally eats with someone feeding her. Gets meds crushed and placed in applesauce.   Past Medical History:  Diagnosis Date   ALS (amyotrophic lateral sclerosis) (HCC)    Amyotrophic lateral sclerosis (HCC)    Anxiety    Ataxia    CHF (congestive heart failure) (HCC)    Chronic pain    Depression    Diabetes mellitus without complication (HCC)    GERD (gastroesophageal reflux disease)    Hepatitis C    Hyperlipidemia    Hypertension    Psychosis (HCC)    PVD (peripheral vascular disease) (HCC)    Thyroid disease    Vitamin D deficiency     Past Surgical History:  Procedure Laterality Date   COLONOSCOPY     ESOPHAGOGASTRODUODENOSCOPY (EGD) WITH PROPOFOL N/A 12/30/2017   Procedure: ESOPHAGOGASTRODUODENOSCOPY (EGD) WITH PROPOFOL;  Surgeon: Christena Deem, MD;  Location: [facility] ENDOSCOPY;  Service: Endoscopy;  Laterality: N/A;   SHOULDER SURGERY     Family History  Problem Relation  Age of Onset   CAD Father    Diabetes Mellitus II Mother    Stroke Mother    Social History   Tobacco Use   Smoking status: Never   Smokeless tobacco: Never  Substance Use Topics   Alcohol use: No   Allergies  Allergen Reactions   Aspirin Other (See Comments)    dizziness   Hctz [Hydrochlorothiazide] Other (See Comments)    cramping   Lisinopril Cough   Prior to Admission medications   Medication Sig Start Date End Date Taking? Authorizing Provider  acetaminophen (TYLENOL) 500 MG tablet Take 1,000 mg by mouth every 6 (six) hours as needed for mild pain or moderate pain. *not to exceed 4 grams/24 hours*   Yes [provider]  atorvastatin (LIPITOR) 10 MG tablet Take 1 tablet by mouth daily. 03/24/21  Yes [provider]  Calcium 500 MG tablet Take 500 mg by mouth daily.   Yes [provider]  Cranberry 450 MG TABS Take 1 tablet by mouth daily.   Yes [provider]  cyclobenzaprine (FLEXERIL) 5 MG tablet Take 5 mg by mouth 3 (three) times daily.   Yes [provider]  divalproex (DEPAKOTE) 250 MG DR tablet Take 250 mg by mouth daily as needed. 03/25/21  Yes [provider]  Docusate Sodium 100 MG capsule Take 100 mg by mouth every 12 (twelve) hours as needed for constipation.   Yes [provider]  DULoxetine (CYMBALTA) 30 MG capsule Take 30 mg by mouth every morning.    Yes [provider]  ergocalciferol (VITAMIN D2) 50000 UNITS capsule Take 50,000 Units by mouth once a week.   Yes [provider]  furosemide (LASIX) 80 MG tablet Take 0.5 tablets (40 mg total) by mouth daily. 04/04/21  Yes Arnetha Courser, MD  gabapentin (NEURONTIN) 300 MG capsule Take 300 mg by mouth at bedtime.   Yes [provider]  HYDROcodone-acetaminophen (NORCO/VICODIN) 5-325 MG tablet Take 1-2 tablets by mouth every 4 (four) hours as needed for moderate pain or severe pain. 04/04/21  Yes Arnetha Courser, MD  insulin glargine  (LANTUS) 100 UNIT/ML injection Inject 10 Units into the skin at bedtime.   Yes [provider]  insulin lispro (HUMALOG) 100 UNIT/ML injection Inject 5 Units into the skin 3 (three) times daily before meals.   Yes [provider]  Ipratropium-Albuterol (COMBIVENT) 20-100 MCG/ACT AERS respimat Inhale 1 puff into the lungs every 6 (six) hours.   Yes [provider]  latanoprost (XALATAN) 0.005 % ophthalmic solution SMARTSIG:In Eye(s) 02/17/21  Yes [provider]  levothyroxine (SYNTHROID) 150 MCG tablet Take 150 mcg by mouth daily. 03/15/21  Yes [provider]  Melatonin 3 MG SUBL Place 3 mg under the tongue. qhs   Yes [provider]  metFORMIN (GLUCOPHAGE) 500 MG tablet Take 500 mg by mouth daily.   Yes [provider]  metoCLOPramide (REGLAN) 5 MG tablet Take 5 mg by mouth 4 (four) times daily.   Yes [provider]  metoprolol tartrate (LOPRESSOR) 25 MG tablet Take 12.5 mg by mouth 2 (two) times daily.   Yes [provider]  Multiple Vitamin (MULTIVITAMIN) tablet Take 1 tablet by mouth daily.   Yes [provider]  nystatin cream (MYCOSTATIN) Apply topically. 03/19/21  Yes [provider]  polyethylene glycol powder (GLYCOLAX/MIRALAX) 17 GM/SCOOP powder Take by mouth.   Yes [provider]  potassium chloride SA (K-DUR,KLOR-CON) 20 MEQ tablet Take 20 mEq by mouth 2 (two) times daily. Take with food.   Yes [provider]  bisacodyl (DULCOLAX) 10 MG suppository Place 10 mg rectally daily as needed for mild constipation, moderate constipation or severe constipation. Patient not taking: Reported on 08/09/2021    [provider]  omeprazole (PRILOSEC) 20 MG capsule Take 20 mg by mouth. Give on days Monday, Wed, Friday Patient not taking: Reported on 08/09/2021    [provider]   Review of Systems  Constitutional:  Negative for appetite change and fatigue.  HENT:   Positive for trouble swallowing. Negative for congestion, postnasal drip and sinus pressure.   Eyes: Negative.   Respiratory:  Positive for cough. Negative for chest tightness and shortness of breath.   Cardiovascular:  Positive for leg swelling. Negative for chest pain and palpitations.  Gastrointestinal:  Negative for abdominal distention and abdominal pain.  Endocrine: Negative.   Genitourinary: Negative.   Musculoskeletal:  Negative for back pain and neck pain.  Skin: Negative.   Allergic/Immunologic: Negative.   Neurological:  Positive for weakness. Negative for dizziness and light-headedness.  Hematological:  Negative for adenopathy. Does not bruise/bleed easily.  Psychiatric/Behavioral:  Negative for dysphoric mood and sleep disturbance. The patient is not nervous/anxious.    Vitals:   08/09/21 1124  BP: (!) 127/98  Pulse: (!) 109  Resp: 18  SpO2: 97%  Height: 5\' 2"  (1.575 m)   Wt Readings from Last 3 Encounters:  07/16/21 168 lb  8 oz (76.4 kg)  05/28/21 168 lb 8 oz (76.4 kg)  04/04/21 160 lb 11.2 oz (72.9 kg)   Lab Results  Component Value Date   CREATININE 0.43 (L) 04/03/2021   CREATININE 0.58 04/02/2021   CREATININE 0.43 (L) 11/14/2015   Physical Exam Vitals and nursing note reviewed. Exam conducted with a chaperone present (daughter & patient's boyfriend presents).  Constitutional:      Appearance: Normal appearance.  HENT:     Head: Normocephalic and atraumatic.  Cardiovascular:     Rate and Rhythm: Tachycardia present.  Pulmonary:     Effort: Pulmonary effort is normal. No respiratory distress.     Breath sounds: No wheezing or rales.  Abdominal:     General: There is no distension.     Palpations: Abdomen is soft.  Musculoskeletal:        General: No tenderness.     Cervical back: Normal range of motion and neck supple.     Right lower leg: Edema (trace pitting) present.     Left lower leg: Edema (trace pitting) present.  Skin:    General: Skin is  warm and dry.  Neurological:     Mental Status: She is alert and oriented to person, place, and time. Mental status is at baseline.  Psychiatric:        Mood and Affect: Mood normal.        Behavior: Behavior normal.    Assessment & Plan:  1: Chronic heart failure with reduced ejection fraction- - NYHA class I - euvolemic today - must be moved with hoyer lift for any movement and she prefers to not be lifted every day to be weighed - unsure of sodium content of foods but whoever feeds her doesn't add any salt to her food - has to have meds crushed and placed in applesauce - will change her metoprolol tartrate to carvedilol with titration as able - look to add entresto, spironolactone and SGLT2 as able - wearing bipap during the day but intermittently at bedtime  2: HTN- - BP looks good today - seeing PCP at [facility] - BMP 08/02/21 reviewed and showed sodium 142, potassium 4.3, creatinine 0.35 and GFR >90  3: ALS- - currently bedbound with dysphagia - palliative care visit done 07/16/21 - saw neurology Zannie Kehr) 10/10/20  4: Depression- - saw psychiatry 06/14/21 - good family support with daughter and patient's boyfriend   Facility medication list reviewed.   Return in 1 month or sooner for any questions/problems before then.

## 2021-08-21 ENCOUNTER — Ambulatory Visit: Payer: Medicare Other | Admitting: Family

## 2021-08-21 ENCOUNTER — Ambulatory Visit: Payer: Medicare Other

## 2021-08-31 ENCOUNTER — Ambulatory Visit: Payer: Medicare Other | Admitting: Family

## 2021-09-06 NOTE — Progress Notes (Signed)
Patient ID: Cynthia Landry, female    DOB: 1956-06-15, 65 y.o.   MRN: [id number]  HPI  Cynthia Landry is a 65 y/o female with a history of hyperlipidemia, HTN, thyroid disease, ALS, anxiety, depression, hepatitis C, psychosis and heart failure.   Echo report (from [facility])  from 06/29/21 reviewed and showed an EF of 30%.  Admitted 04/02/21 due to left thigh pain. Doppler was negative for DVT. Became hypotensive along with tacycardia so code sepsis called and antibiotics given. Pain thought to be due to advanced ALS and bilateral feet contractures. Discharged after 2 days.   She presents today for a follow-up visit with a chief complaint of pedal edema. She says that this has been chronic in nature for several years. She has associated weakness along with this. She denies any difficulty sleeping, dizziness, abdominal distention, palpitations, chest pain, shortness of breath, cough or fatigue.   Currently recovering from shingles.   Currently on a liquid diet due to ileus but normally eats with someone feeding her. Gets meds crushed and placed in applesauce. Had a recent CXR obtained because she was coughing quite a bit after eating one day.   Past Medical History:  Diagnosis Date   ALS (amyotrophic lateral sclerosis) (HCC)    Amyotrophic lateral sclerosis (HCC)    Anxiety    Ataxia    CHF (congestive heart failure) (HCC)    Chronic pain    Depression    Diabetes mellitus without complication (HCC)    GERD (gastroesophageal reflux disease)    Hepatitis C    Hyperlipidemia    Hypertension    Psychosis (HCC)    PVD (peripheral vascular disease) (HCC)    Thyroid disease    Vitamin D deficiency     Past Surgical History:  Procedure Laterality Date   COLONOSCOPY     ESOPHAGOGASTRODUODENOSCOPY (EGD) WITH PROPOFOL N/A 12/30/2017   Procedure: ESOPHAGOGASTRODUODENOSCOPY (EGD) WITH PROPOFOL;  Surgeon: Christena Deem, MD;  Location: [facility] ENDOSCOPY;  Service: Endoscopy;  Laterality: N/A;    SHOULDER SURGERY     Family History  Problem Relation Age of Onset   CAD Father    Diabetes Mellitus II Mother    Stroke Mother    Social History   Tobacco Use   Smoking status: Never   Smokeless tobacco: Never  Substance Use Topics   Alcohol use: No   Allergies  Allergen Reactions   Aspirin Other (See Comments)    dizziness   Hctz [Hydrochlorothiazide] Other (See Comments)    cramping   Lisinopril Cough   Prior to Admission medications   Medication Sig Start Date End Date Taking? Authorizing Provider  atorvastatin (LIPITOR) 10 MG tablet Take 1 tablet by mouth daily. 03/24/21  Yes [provider]  Calcium 500 MG tablet Take 500 mg by mouth daily.   Yes [provider]  carvedilol (COREG) 3.125 MG tablet Take 1 tablet (3.125 mg total) by mouth 2 (two) times daily. 08/09/21 11/07/21 Yes Jayjay Littles, Inetta Fermo A, FNP  cyclobenzaprine (FLEXERIL) 5 MG tablet Take 5 mg by mouth 3 (three) times daily.   Yes [provider]  divalproex (DEPAKOTE) 250 MG DR tablet Take 250 mg by mouth daily as needed. 03/25/21  Yes [provider]  Docusate Sodium 100 MG capsule Take 100 mg by mouth every 12 (twelve) hours as needed for constipation.   Yes [provider]  DULoxetine (CYMBALTA) 30 MG capsule Take 30 mg by mouth every morning.    Yes [provider]  ergocalciferol (VITAMIN D2) 50000 UNITS capsule Take 50,000 Units by mouth once a week.   Yes [provider]  furosemide (LASIX) 80 MG tablet Take 0.5 tablets (40 mg total) by mouth daily. Patient taking differently: Take 40 mg by mouth 2 (two) times daily. 04/04/21  Yes Arnetha Courser, MD  gabapentin (NEURONTIN) 300 MG capsule Take 300 mg by mouth at bedtime.   Yes [provider]  HYDROcodone-acetaminophen (NORCO/VICODIN) 5-325 MG tablet Take 1-2 tablets by mouth every 4 (four) hours as needed for moderate pain or severe pain. 04/04/21  Yes Arnetha Courser, MD  insulin glargine (LANTUS)  100 UNIT/ML injection Inject 10 Units into the skin at bedtime.   Yes [provider]  insulin lispro (HUMALOG) 100 UNIT/ML injection Inject 5 Units into the skin 3 (three) times daily before meals.   Yes [provider]  Ipratropium-Albuterol (COMBIVENT) 20-100 MCG/ACT AERS respimat Inhale 1 puff into the lungs every 6 (six) hours.   Yes [provider]  latanoprost (XALATAN) 0.005 % ophthalmic solution SMARTSIG:In Eye(s) 02/17/21  Yes [provider]  levothyroxine (SYNTHROID) 150 MCG tablet Take 150 mcg by mouth daily. 03/15/21  Yes [provider]  Melatonin 3 MG SUBL Place 3 mg under the tongue. qhs   Yes [provider]  metFORMIN (GLUCOPHAGE) 500 MG tablet Take 500 mg by mouth daily.   Yes [provider]  metoCLOPramide (REGLAN) 5 MG tablet Take 5 mg by mouth 4 (four) times daily.   Yes [provider]  Multiple Vitamin (MULTIVITAMIN) tablet Take 1 tablet by mouth daily.   Yes [provider]  nystatin cream (MYCOSTATIN) Apply topically. 03/19/21  Yes [provider]  polyethylene glycol powder (GLYCOLAX/MIRALAX) 17 GM/SCOOP powder Take by mouth.   Yes [provider]  potassium chloride SA (K-DUR,KLOR-CON) 20 MEQ tablet Take 20 mEq by mouth 2 (two) times daily. Take with food.   Yes [provider]  Cranberry 450 MG TABS Take 1 tablet by mouth daily.    [provider]  omeprazole (PRILOSEC) 20 MG capsule Take 20 mg by mouth. Give on days Monday, Wed, Friday Patient not taking: No sig reported    [provider]    Review of Systems  Constitutional:  Negative for appetite change and fatigue.  HENT:  Positive for trouble swallowing. Negative for congestion, postnasal drip and sinus pressure.   Eyes: Negative.   Respiratory:  Negative for cough, chest tightness and shortness of breath.   Cardiovascular:  Positive for leg swelling. Negative for chest pain and  palpitations.  Gastrointestinal:  Negative for abdominal distention and abdominal pain.  Endocrine: Negative.   Genitourinary: Negative.   Musculoskeletal:  Negative for back pain and neck pain.  Skin: Negative.   Allergic/Immunologic: Negative.   Neurological:  Positive for weakness. Negative for dizziness and light-headedness.  Hematological:  Negative for adenopathy. Does not bruise/bleed easily.  Psychiatric/Behavioral:  Negative for dysphoric mood and sleep disturbance. The patient is not nervous/anxious.    Vitals:   09/07/21 1105  BP: 128/90  Pulse: 99  Resp: 18  SpO2: 97%  Height: 5\' 4"  (1.626 m)   Wt Readings from Last 3 Encounters:  07/16/21 168 lb 8 oz (76.4 kg)  05/28/21 168 lb 8 oz (76.4 kg)  04/04/21 160 lb 11.2 oz (72.9 kg)   Lab Results  Component Value Date   CREATININE 0.43 (L) 04/03/2021   CREATININE 0.58 04/02/2021   CREATININE 0.43 (L) 11/14/2015  Physical Exam Vitals and nursing note reviewed. Exam conducted with a chaperone present (daughter present).  Constitutional:      Appearance: Normal appearance.  HENT:     Head: Normocephalic and atraumatic.  Cardiovascular:     Rate and Rhythm: Normal rate.  Pulmonary:     Effort: Pulmonary effort is normal. No respiratory distress.     Breath sounds: No wheezing or rales.  Abdominal:     General: There is no distension.     Palpations: Abdomen is soft.  Musculoskeletal:        General: No tenderness.     Cervical back: Normal range of motion and neck supple.     Right lower leg: Edema (trace pitting) present.     Left lower leg: Edema (trace pitting) present.  Skin:    General: Skin is warm and dry.  Neurological:     Mental Status: She is alert and oriented to person, place, and time. Mental status is at baseline.  Psychiatric:        Mood and Affect: Mood normal.        Behavior: Behavior normal.    Assessment & Plan:  1: Chronic heart failure with reduced ejection fraction- - NYHA  class I - euvolemic today - must be moved with hoyer lift for any movement and she prefers to not be lifted every day to be weighed - unsure of sodium content of foods but whoever feeds her doesn't add any salt to her food - has to have meds crushed and placed in applesauce - on GDMT of carvedilol - will add entresto 24/26mg  BID; order written for facility to draw labs on 10/03/21 & fax results to [place] - look to add spironolactone and SGLT2 as able - wearing bipap during the day but intermittently at bedtime  2: HTN- - BP looks good (128/90)  - facility BP log reviewed and shows BP of 110-146/56-72 - seeing PCP at [facility] - BMP 08/02/21 (@ [facility]) reviewed and showed sodium 142, potassium 4.3, creatinine 0.35 and GFR >90  3: ALS- - currently bedbound with dysphagia - recent CXR report from [facility] on 08/26/21 showed interstitial edema, cardiomegaly and pulmonary vascular redistribution - palliative care visit done 07/16/21 - saw neurology Zannie Kehr) 10/10/20  4: Depression- - saw psychiatry 06/14/21 - good family support with daughter    Facility medication list reviewed.   Return in 1 month or sooner for any questions/problems before then.

## 2021-09-07 ENCOUNTER — Ambulatory Visit: Payer: Medicare Other | Attending: Family | Admitting: Family

## 2021-09-07 ENCOUNTER — Other Ambulatory Visit: Payer: Self-pay

## 2021-09-07 ENCOUNTER — Encounter: Payer: Self-pay | Admitting: Family

## 2021-09-07 VITALS — BP 128/90 | HR 99 | Resp 18 | Ht 64.0 in

## 2021-09-07 DIAGNOSIS — Z79899 Other long term (current) drug therapy: Secondary | ICD-10-CM | POA: Diagnosis not present

## 2021-09-07 DIAGNOSIS — Z79890 Hormone replacement therapy (postmenopausal): Secondary | ICD-10-CM | POA: Diagnosis not present

## 2021-09-07 DIAGNOSIS — G1221 Amyotrophic lateral sclerosis: Secondary | ICD-10-CM | POA: Diagnosis not present

## 2021-09-07 DIAGNOSIS — F32A Depression, unspecified: Secondary | ICD-10-CM | POA: Insufficient documentation

## 2021-09-07 DIAGNOSIS — Z7984 Long term (current) use of oral hypoglycemic drugs: Secondary | ICD-10-CM | POA: Insufficient documentation

## 2021-09-07 DIAGNOSIS — E785 Hyperlipidemia, unspecified: Secondary | ICD-10-CM | POA: Insufficient documentation

## 2021-09-07 DIAGNOSIS — I10 Essential (primary) hypertension: Secondary | ICD-10-CM

## 2021-09-07 DIAGNOSIS — F419 Anxiety disorder, unspecified: Secondary | ICD-10-CM | POA: Diagnosis not present

## 2021-09-07 DIAGNOSIS — Z794 Long term (current) use of insulin: Secondary | ICD-10-CM | POA: Insufficient documentation

## 2021-09-07 DIAGNOSIS — I5022 Chronic systolic (congestive) heart failure: Secondary | ICD-10-CM | POA: Diagnosis not present

## 2021-09-07 DIAGNOSIS — Z888 Allergy status to other drugs, medicaments and biological substances status: Secondary | ICD-10-CM | POA: Diagnosis not present

## 2021-09-07 DIAGNOSIS — Z8249 Family history of ischemic heart disease and other diseases of the circulatory system: Secondary | ICD-10-CM | POA: Diagnosis not present

## 2021-09-07 DIAGNOSIS — Z886 Allergy status to analgesic agent status: Secondary | ICD-10-CM | POA: Diagnosis not present

## 2021-09-07 DIAGNOSIS — F329 Major depressive disorder, single episode, unspecified: Secondary | ICD-10-CM

## 2021-09-07 DIAGNOSIS — I110 Hypertensive heart disease with heart failure: Secondary | ICD-10-CM | POA: Insufficient documentation

## 2021-09-07 DIAGNOSIS — E079 Disorder of thyroid, unspecified: Secondary | ICD-10-CM | POA: Insufficient documentation

## 2021-09-07 DIAGNOSIS — Z7401 Bed confinement status: Secondary | ICD-10-CM | POA: Insufficient documentation

## 2021-09-07 MED ORDER — SACUBITRIL-VALSARTAN 24-26 MG PO TABS: 1.0000 | ORAL_TABLET | Freq: Two times a day (BID) | ORAL | 3 refills | Status: DC

## 2021-09-07 NOTE — Patient Instructions (Signed)
Begin entresto 24/26mg  as 1 tablet twice a day.   To have BMP drawn on 10/03/21 and results faxed to the HF clinic at [phone]

## 2021-09-10 ENCOUNTER — Ambulatory Visit: Payer: Medicare Other | Admitting: Family

## 2021-09-24 ENCOUNTER — Encounter: Payer: Self-pay | Admitting: Nurse Practitioner

## 2021-09-24 ENCOUNTER — Non-Acute Institutional Stay: Payer: Medicare Other | Admitting: Nurse Practitioner

## 2021-09-24 VITALS — BP 112/56 | HR 82 | Temp 97.0°F | Resp 18 | Wt 169.0 lb

## 2021-09-24 DIAGNOSIS — R5381 Other malaise: Secondary | ICD-10-CM

## 2021-09-24 DIAGNOSIS — Z515 Encounter for palliative care: Secondary | ICD-10-CM

## 2021-09-24 DIAGNOSIS — R1310 Dysphagia, unspecified: Secondary | ICD-10-CM

## 2021-09-24 DIAGNOSIS — G1221 Amyotrophic lateral sclerosis: Secondary | ICD-10-CM

## 2021-09-24 NOTE — Progress Notes (Addendum)
[employer] [facility] Palliative Care Consult Note Telephone: [phone]  Fax: [phone]    Date of encounter: 09/24/21 8:23 PM PATIENT NAME: Cynthia Landry [address] [place] [address]   [phone] (home)  DOB: 1956-03-30 MRN: [id number] PRIMARY CARE PROVIDER:    [facility]  RESPONSIBLE PARTY:    Contact Information     Name Relation Home Work Mobile   [place] C Daughter [phone]        I met face to face with patient in facility. Palliative Care was asked to follow this patient by consultation request of  Dr Nicole Kindred to address advance care planning and complex medical decision making. This is a follow up visit.                                  ASSESSMENT AND PLAN / RECOMMENDATIONS:  Symptom Management/Plan: ACP: DNR   Palliative care 5 / 7 / 2019 until present Palliative care 11 / 21 / 2017 to 11 / 9 / 2018 Hospice 11 / 11 / 2018 to 5 / 1 / 2019   2. Dysphagia; secondary to ALS. Continue to monitor weights, appetite, aspiration precautions.    3. Debility secondary to ALS, encourage passive rom; encourage to transfer to geri-chair.    4. Palliative care encounter; Palliative medicine team will continue to support patient, patient's family, and medical team. Visit consisted of counseling and education dealing with the complex and emotionally intense issues of symptom management and palliative care in the setting of serious and potentially life-threatening illness   5. F/u 1 months for ongoing monitoring weight, dysphagia, ALS progression  Follow up Palliative Care Visit: Palliative care will continue to follow for complex medical decision making, advance care planning, and clarification of goals. Return 4 weeks or prn.  I spent 67 minutes providing this consultation. More than 50% of the time in this consultation was spent in counseling and care coordination. PPS:  30%  Chief Complaint: Follow up  palliative consult for complex medical decision making  HISTORY OF PRESENT ILLNESS:  Cynthia Landry is a 65 y.o. year old female  with multiple medical problems including. ALS, dysphasia, hepatitis C, left humerus pathological fractures, muscle weakness. Cynthia Landry continues to reside at [place] [facility]. Cynthia Landry remains Bed-bound, total ADL dependents including bathing, dressing, toileting as she is incontinent of bowel and bladder. Cynthia Landry requires to be fed by staff. 169 lbs with 0.5 lbs loss bmi 33. Followed by psychiatry last seen 09/06/2021 for recurrent depression with mood disorder on cymbalta and depakote. Depakote was increased today due to mood With yelling episodes. Depakote was increased to 125 mg PO TID. Insomnia prescribed melatonin. Staff endorses no other changes. At present Cynthia Landry is lying in bed, just finished being fed lunch by staff with good appetite. Cynthia Landry appears debilitated, comfortable. No visitors present. I visited and observed Cynthia Landry. We talked about purpose of pc visit, Cynthia Landry in agreement. We talked about how she has been feeling today, symptoms, ros, appetite, weight, dysphagia which has not been worsening, functional abilities like being OOB. Cynthia Landry endorses she did get up to get her hair done recently. We talked about quality of life, progression of ALS. We talked about residing facility with debilitating disease and relying on others for assistance. Talked about coping strategies. Therapeutic listening, emotional  support provided. We talked about her daughter, Cynthia Landry. Discussed will contact for update on pc visit. I called Cynthia Landry, Cynthia Landry daughter, HCPOA, update on pc visit discussed. We talked about recent episode of chf which is new for Ms. Terry. We talked about symptoms, medical goals, functional ability, oob, quality of life, f/u pc visit. Will recommend oob to be hoyer to recliner for changes of  environment, previously she was put in the hall. I updated staff.   08/02/2021 WB C 9.6, hemoglobin 14.9, hematocrit 45, platelets 269, sodium 142, potassium 4.3, chloride 100, Co 228, calcium 9.6, bun 9.3, creatinine 0.35, glucose 108, total protein 8.3, albumin 3.9, LT28, AST 35    History obtained from review of EMR, discussion with facility staff and Ms. Sylvia in person and Cynthia Landry daughter per phone I reviewed available labs, medications, imaging, studies and related documents from the EMR.  Records reviewed and summarized above.   ROS Full 10 system review of systems performed and negative with exception of: as per HPI.   Physical Exam: Constitutional: NAD General: frail appearing, bed-bound, debilitated pleasant female EYES: lids intact ENMT: oral mucous membranes moist CV: S1S2, RRR Pulmonary: LCTA, no increased work of breathing, no cough, room air Abdomen: normo-active BS + 4 quadrants, soft and non tender MSK: bed-bound; functional quadriplegic Skin: warm and dry Neuro:  + generalized weakness,  no cognitive impairment Psych: non-anxious affect, A and O x 3  Questions and concerns were addressed. Provided general support and encouragement, no other unmet needs identified   Thank you for the opportunity to participate in the care of [name].  The palliative care team will continue to follow. Please call our office at [phone] if we can be of additional assistance.   This chart was dictated using voice recognition software.  Despite best efforts to proofread,  errors can occur which can change the documentation meaning.   Keyerra Lamere Ihor Gully, NP

## 2021-09-25 ENCOUNTER — Other Ambulatory Visit: Payer: Self-pay

## 2021-10-05 ENCOUNTER — Telehealth: Payer: Self-pay | Admitting: Family

## 2021-10-05 NOTE — Telephone Encounter (Signed)
Received labs from [facility] that were drawn on 10/03/21:   Sodium 141 Potassium 5.0 Creatinine 0.46 GFR >90   Continue medications at this time. Recheck labs in 1 month.

## 2021-10-09 ENCOUNTER — Ambulatory Visit: Payer: Medicare Other | Attending: Family | Admitting: Family

## 2021-10-09 ENCOUNTER — Other Ambulatory Visit: Payer: Self-pay

## 2021-10-09 ENCOUNTER — Encounter: Payer: Self-pay | Admitting: Family

## 2021-10-09 VITALS — BP 122/87 | HR 95 | Resp 18 | Ht 64.0 in

## 2021-10-09 DIAGNOSIS — F419 Anxiety disorder, unspecified: Secondary | ICD-10-CM | POA: Insufficient documentation

## 2021-10-09 DIAGNOSIS — F329 Major depressive disorder, single episode, unspecified: Secondary | ICD-10-CM | POA: Diagnosis not present

## 2021-10-09 DIAGNOSIS — Z7401 Bed confinement status: Secondary | ICD-10-CM | POA: Diagnosis not present

## 2021-10-09 DIAGNOSIS — I5022 Chronic systolic (congestive) heart failure: Secondary | ICD-10-CM | POA: Diagnosis not present

## 2021-10-09 DIAGNOSIS — B1920 Unspecified viral hepatitis C without hepatic coma: Secondary | ICD-10-CM | POA: Insufficient documentation

## 2021-10-09 DIAGNOSIS — F29 Unspecified psychosis not due to a substance or known physiological condition: Secondary | ICD-10-CM | POA: Insufficient documentation

## 2021-10-09 DIAGNOSIS — Z79899 Other long term (current) drug therapy: Secondary | ICD-10-CM | POA: Diagnosis not present

## 2021-10-09 DIAGNOSIS — G1221 Amyotrophic lateral sclerosis: Secondary | ICD-10-CM

## 2021-10-09 DIAGNOSIS — F32A Depression, unspecified: Secondary | ICD-10-CM | POA: Insufficient documentation

## 2021-10-09 DIAGNOSIS — E785 Hyperlipidemia, unspecified: Secondary | ICD-10-CM | POA: Insufficient documentation

## 2021-10-09 DIAGNOSIS — I10 Essential (primary) hypertension: Secondary | ICD-10-CM | POA: Diagnosis not present

## 2021-10-09 DIAGNOSIS — I110 Hypertensive heart disease with heart failure: Secondary | ICD-10-CM | POA: Insufficient documentation

## 2021-10-09 DIAGNOSIS — E079 Disorder of thyroid, unspecified: Secondary | ICD-10-CM | POA: Diagnosis not present

## 2021-10-09 NOTE — Patient Instructions (Signed)
Increase carvedilol to 6.25mg  twice a day  Check lab work next week and fax to the [facility] at [phone]

## 2021-10-09 NOTE — Progress Notes (Signed)
Patient ID: Cynthia Landry, female    DOB: 03-08-1956, 65 y.o.   MRN: [id number]  HPI  Cynthia Landry is a 65 y/o female with a history of hyperlipidemia, HTN, thyroid disease, ALS, anxiety, depression, hepatitis C, psychosis and heart failure.   Echo report (from [facility])  from 06/29/21 reviewed and showed an EF of 30%.  Has not been admitted or been in the ED in the last 6 months.   She presents today for a follow-up visit with a chief complaint of weakness. She says this has been present for years. She has associated pedal edema along with this. She denies any dizziness, abdominal distention, palpitations, chest pain, shortness of breath, cough or fatigue.   Is currently having difficulty sleeping because she has a loud roommate at the facility.   Past Medical History:  Diagnosis Date   ALS (amyotrophic lateral sclerosis) (HCC)    Amyotrophic lateral sclerosis (HCC)    Anxiety    Ataxia    CHF (congestive heart failure) (HCC)    Chronic pain    Depression    Diabetes mellitus without complication (HCC)    GERD (gastroesophageal reflux disease)    Hepatitis C    Hyperlipidemia    Hypertension    Psychosis (HCC)    PVD (peripheral vascular disease) (HCC)    Thyroid disease    Vitamin D deficiency     Past Surgical History:  Procedure Laterality Date   COLONOSCOPY     ESOPHAGOGASTRODUODENOSCOPY (EGD) WITH PROPOFOL N/A 12/30/2017   Procedure: ESOPHAGOGASTRODUODENOSCOPY (EGD) WITH PROPOFOL;  Surgeon: Christena Deem, MD;  Location: [facility] ENDOSCOPY;  Service: Endoscopy;  Laterality: N/A;   SHOULDER SURGERY     Family History  Problem Relation Age of Onset   CAD Father    Diabetes Mellitus II Mother    Stroke Mother    Social History   Tobacco Use   Smoking status: Never   Smokeless tobacco: Never  Substance Use Topics   Alcohol use: No   Allergies  Allergen Reactions   Aspirin Other (See Comments)    dizziness   Hctz [Hydrochlorothiazide] Other (See Comments)     cramping   Lisinopril Cough   Prior to Admission medications   Medication Sig Start Date End Date Taking? Authorizing Provider  atorvastatin (LIPITOR) 10 MG tablet Take 1 tablet by mouth daily. 03/24/21  Yes [provider]  Calcium 500 MG tablet Take 500 mg by mouth daily.   Yes [provider]  carvedilol (COREG) 3.125 MG tablet Take 1 tablet (3.125 mg total) by mouth 2 (two) times daily. 08/09/21 11/07/21 Yes Rilley Stash A, FNP  Cranberry 450 MG TABS Take 1 tablet by mouth daily.   Yes [provider]  cyclobenzaprine (FLEXERIL) 5 MG tablet Take 5 mg by mouth 3 (three) times daily.   Yes [provider]  divalproex (DEPAKOTE) 250 MG DR tablet Take 250 mg by mouth daily. 03/25/21  Yes [provider]  Docusate Sodium 100 MG capsule Take 100 mg by mouth every 12 (twelve) hours as needed for constipation.   Yes [provider]  DULoxetine (CYMBALTA) 30 MG capsule Take 30 mg by mouth every morning.    Yes [provider]  ergocalciferol (VITAMIN D2) 50000 UNITS capsule Take 50,000 Units by mouth daily.   Yes [provider]  furosemide (LASIX) 80 MG tablet Take 0.5 tablets (40 mg total) by mouth daily. Patient taking differently: Take 40 mg by mouth 2 (two) times  daily. 04/04/21  Yes Arnetha Courser, MD  gabapentin (NEURONTIN) 300 MG capsule Take 300 mg by mouth at bedtime.   Yes [provider]  HYDROcodone-acetaminophen (NORCO/VICODIN) 5-325 MG tablet Take 1-2 tablets by mouth every 4 (four) hours as needed for moderate pain or severe pain. 04/04/21  Yes Arnetha Courser, MD  insulin glargine (LANTUS) 100 UNIT/ML injection Inject 10 Units into the skin at bedtime.   Yes [provider]  insulin lispro (HUMALOG) 100 UNIT/ML injection Inject 5 Units into the skin 3 (three) times daily before meals.   Yes [provider]  Ipratropium-Albuterol (COMBIVENT) 20-100 MCG/ACT AERS respimat Inhale 1 puff into the  lungs every 6 (six) hours.   Yes [provider]  latanoprost (XALATAN) 0.005 % ophthalmic solution SMARTSIG:In Eye(s) 02/17/21  Yes [provider]  levothyroxine (SYNTHROID) 150 MCG tablet Take 150 mcg by mouth daily. 03/15/21  Yes [provider]  Melatonin 10 MG SUBL Place 10 mg under the tongue. qhs   Yes [provider]  metFORMIN (GLUCOPHAGE) 500 MG tablet Take 500 mg by mouth daily.   Yes [provider]  metoCLOPramide (REGLAN) 5 MG tablet Take 5 mg by mouth 4 (four) times daily.   Yes [provider]  Multiple Vitamin (MULTIVITAMIN) tablet Take 1 tablet by mouth daily.   Yes [provider]  nystatin cream (MYCOSTATIN) Apply topically. 03/19/21  Yes [provider]  polyethylene glycol powder (GLYCOLAX/MIRALAX) 17 GM/SCOOP powder Take by mouth.   Yes [provider]  potassium chloride SA (K-DUR,KLOR-CON) 20 MEQ tablet Take 20 mEq by mouth 2 (two) times daily. Take with food.   Yes [provider]  sacubitril-valsartan (ENTRESTO) 24-26 MG Take 1 tablet by mouth 2 (two) times daily. 09/07/21  Yes Delma Freeze, FNP   Review of Systems  Constitutional:  Negative for appetite change and fatigue.  HENT:  Positive for trouble swallowing. Negative for congestion, postnasal drip and sinus pressure.   Eyes: Negative.   Respiratory:  Negative for cough, chest tightness and shortness of breath.   Cardiovascular:  Positive for leg swelling. Negative for chest pain and palpitations.  Gastrointestinal:  Negative for abdominal distention and abdominal pain.  Endocrine: Negative.   Genitourinary: Negative.   Musculoskeletal:  Negative for back pain and neck pain.  Skin: Negative.   Allergic/Immunologic: Negative.   Neurological:  Positive for weakness. Negative for dizziness and light-headedness.  Hematological:  Negative for adenopathy. Does not bruise/bleed easily.  Psychiatric/Behavioral:  Negative for  dysphoric mood and sleep disturbance. The patient is not nervous/anxious.    Vitals:   10/09/21 1150 10/09/21 1202  BP: 122/87   Pulse: (!) 103 95  Resp: 18   SpO2: 97%   Height: 5\' 4"  (1.626 m)    Wt Readings from Last 3 Encounters:  09/24/21 169 lb (76.7 kg)  07/16/21 168 lb 8 oz (76.4 kg)  05/28/21 168 lb 8 oz (76.4 kg)   Lab Results  Component Value Date   CREATININE 0.43 (L) 04/03/2021   CREATININE 0.58 04/02/2021   CREATININE 0.43 (L) 11/14/2015    Physical Exam Vitals and nursing note reviewed. Exam conducted with a chaperone present (boyfriend present).  Constitutional:      Appearance: Normal appearance.  HENT:     Head: Normocephalic and atraumatic.  Cardiovascular:     Rate and Rhythm: Normal rate.  Pulmonary:     Effort: Pulmonary effort is normal. No respiratory distress.     Breath sounds: No  wheezing or rales.  Abdominal:     General: There is no distension.     Palpations: Abdomen is soft.  Musculoskeletal:        General: No tenderness.     Cervical back: Normal range of motion and neck supple.     Right lower leg: Edema (trace pitting) present.     Left lower leg: Edema (trace pitting) present.  Skin:    General: Skin is warm and dry.  Neurological:     Mental Status: She is alert and oriented to person, place, and time. Mental status is at baseline.  Psychiatric:        Mood and Affect: Mood normal.        Behavior: Behavior normal.    Assessment & Plan:  1: Chronic heart failure with reduced ejection fraction- - NYHA class I - euvolemic today - must be moved with hoyer lift for any movement and she prefers to not be lifted every day to be weighed - unsure of sodium content of foods but whoever feeds her doesn't add any salt to her food - has to have meds crushed and placed in applesauce - on GDMT of carvedilol & entresto - entresto 24/26mg  BID added at last visit; labs drawn by facility on 10/03/21 and reviewed  - potassium was slightly  high at 5.0; order written for facility to check labs on 10/15/21 and fax results to [place] - will increase carvedilol to 6.25mg  BID due to HR of 95-103 - consider adding SGLT2, would not add MRA at this time due to potassium level - wearing bipap during the day but intermittently at bedtime  2: HTN- - BP looks good (122/87) - facility BP log reviewed and shows BP of 108-142/58-80 - seeing PCP at [facility] - BMP 10/03/21 (@ [facility]) reviewed and showed sodium 141, potassium 5.0, creatinine 0.46 and GFR >90  3: ALS- - currently bedbound with dysphagia - CXR report from [facility] on 08/26/21 showed interstitial edema, cardiomegaly and pulmonary vascular redistribution - palliative care visit done 07/16/21 - saw neurology Zannie Kehr) 10/10/20  4: Depression- - saw psychiatry 06/14/21 - good family support with daughter    Facility medication list reviewed.   Return in 1 month or sooner for any questions/problems before then.

## 2021-11-12 NOTE — Progress Notes (Signed)
Patient ID: Cynthia Landry, female    DOB: Apr 10, 1956, 65 y.o.   MRN: [id number]  HPI  Cynthia Landry is a 65 y/o female with a history of hyperlipidemia, HTN, thyroid disease, ALS, anxiety, depression, hepatitis C, psychosis and heart failure.   Echo report (from [facility])  from 06/29/21 reviewed and showed an EF of 30%.  Has not been admitted or been in the ED in the last 6 months.   She presents today for a follow-up visit with a chief complaint of weakness.She describes this as chronic in nature having been present for several years. She has associated pedal edema and weakness along with this. She denies any difficulty sleeping, dizziness, abdominal distention, palpitations, chest pain, shortness of breath, cough or fatigue.   Gets weighed intermittently because she has to get weighed in a hoyer lift due to her ALS.   Past Medical History:  Diagnosis Date   ALS (amyotrophic lateral sclerosis) (HCC)    Amyotrophic lateral sclerosis (HCC)    Anxiety    Ataxia    CHF (congestive heart failure) (HCC)    Chronic pain    Depression    Diabetes mellitus without complication (HCC)    GERD (gastroesophageal reflux disease)    Hepatitis C    Hyperlipidemia    Hypertension    Psychosis (HCC)    PVD (peripheral vascular disease) (HCC)    Thyroid disease    Vitamin D deficiency     Past Surgical History:  Procedure Laterality Date   COLONOSCOPY     ESOPHAGOGASTRODUODENOSCOPY (EGD) WITH PROPOFOL N/A 12/30/2017   Procedure: ESOPHAGOGASTRODUODENOSCOPY (EGD) WITH PROPOFOL;  Surgeon: Christena Deem, MD;  Location: [facility] ENDOSCOPY;  Service: Endoscopy;  Laterality: N/A;   SHOULDER SURGERY     Family History  Problem Relation Age of Onset   CAD Father    Diabetes Mellitus II Mother    Stroke Mother    Social History   Tobacco Use   Smoking status: Never   Smokeless tobacco: Never  Substance Use Topics   Alcohol use: No   Allergies  Allergen Reactions   Aspirin Other (See Comments)     dizziness   Hctz [Hydrochlorothiazide] Other (See Comments)    cramping   Lisinopril Cough   Prior to Admission medications   Medication Sig Start Date End Date Taking? Authorizing Provider  atorvastatin (LIPITOR) 10 MG tablet Take 1 tablet by mouth daily. 03/24/21  Yes [provider]  carvedilol (COREG) 6.25 MG tablet Take 6.25 mg by mouth 2 (two) times daily with a meal.   Yes [provider]  cyclobenzaprine (FLEXERIL) 5 MG tablet Take 5 mg by mouth 3 (three) times daily.   Yes [provider]  divalproex (DEPAKOTE) 250 MG DR tablet Take 250 mg by mouth daily. 03/25/21  Yes [provider]  DULoxetine (CYMBALTA) 30 MG capsule Take 60 mg by mouth every morning.   Yes [provider]  furosemide (LASIX) 80 MG tablet Take 0.5 tablets (40 mg total) by mouth daily. Patient taking differently: Take 40 mg by mouth 2 (two) times daily. 04/04/21  Yes Arnetha Courser, MD  gabapentin (NEURONTIN) 300 MG capsule Take 300 mg by mouth at bedtime.   Yes [provider]  HYDROcodone-acetaminophen (NORCO/VICODIN) 5-325 MG tablet Take 1-2 tablets by mouth every 4 (four) hours as needed for moderate pain or severe pain. 04/04/21  Yes Arnetha Courser, MD  insulin glargine (LANTUS) 100 UNIT/ML injection Inject 10 Units into the skin at  bedtime.   Yes [provider]  insulin lispro (HUMALOG) 100 UNIT/ML injection Inject 5 Units into the skin 3 (three) times daily before meals.   Yes [provider]  latanoprost (XALATAN) 0.005 % ophthalmic solution SMARTSIG:In Eye(s) 02/17/21  Yes [provider]  levothyroxine (SYNTHROID) 150 MCG tablet Take 150 mcg by mouth daily. 03/15/21  Yes [provider]  Melatonin 10 MG SUBL Place 10 mg under the tongue. qhs   Yes [provider]  metFORMIN (GLUCOPHAGE) 500 MG tablet Take 500 mg by mouth daily.   Yes [provider]  metoCLOPramide (REGLAN) 5 MG tablet Take 5 mg by mouth 4  (four) times daily.   Yes [provider]  nystatin cream (MYCOSTATIN) Apply topically. 03/19/21  Yes [provider]  polyethylene glycol powder (GLYCOLAX/MIRALAX) 17 GM/SCOOP powder Take by mouth.   Yes [provider]  sacubitril-valsartan (ENTRESTO) 24-26 MG Take 1 tablet by mouth 2 (two) times daily. 09/07/21  Yes Jawara Latorre, Inetta Fermo A, FNP  sertraline (ZOLOFT) 25 MG tablet Take 25 mg by mouth daily.   Yes [provider]  Docusate Sodium 100 MG capsule Take 100 mg by mouth every 12 (twelve) hours as needed for constipation.    [provider]  Ipratropium-Albuterol (COMBIVENT) 20-100 MCG/ACT AERS respimat Inhale 1 puff into the lungs every 6 (six) hours.    [provider]  Multiple Vitamin (MULTIVITAMIN) tablet Take 1 tablet by mouth daily.    [provider]    Review of Systems  Constitutional:  Negative for appetite change and fatigue.  HENT:  Positive for trouble swallowing. Negative for congestion, postnasal drip and sinus pressure.   Eyes: Negative.   Respiratory:  Negative for cough, chest tightness and shortness of breath.   Cardiovascular:  Positive for leg swelling. Negative for chest pain and palpitations.  Gastrointestinal:  Negative for abdominal distention and abdominal pain.  Endocrine: Negative.   Genitourinary: Negative.   Musculoskeletal:  Negative for back pain and neck pain.  Skin: Negative.   Allergic/Immunologic: Negative.   Neurological:  Positive for weakness. Negative for dizziness and light-headedness.  Hematological:  Negative for adenopathy. Does not bruise/bleed easily.  Psychiatric/Behavioral:  Negative for dysphoric mood and sleep disturbance. The patient is not nervous/anxious.    Vitals:   11/14/21 1144  BP: (!) 97/55  Pulse: (!) 52  Resp: 16  SpO2: 90%  Height: 5' (1.524 m)   Wt Readings from Last 3 Encounters:  09/24/21 169 lb (76.7 kg)  07/16/21 168 lb 8 oz (76.4 kg)  05/28/21 168 lb  8 oz (76.4 kg)   Lab Results  Component Value Date   CREATININE 0.43 (L) 04/03/2021   CREATININE 0.58 04/02/2021   CREATININE 0.43 (L) 11/14/2015   Physical Exam Vitals and nursing note reviewed. Exam conducted with a chaperone present (boyfriend present).  Constitutional:      Appearance: Normal appearance.  HENT:     Head: Normocephalic and atraumatic.  Cardiovascular:     Rate and Rhythm: Normal rate.  Pulmonary:     Effort: Pulmonary effort is normal. No respiratory distress.     Breath sounds: No wheezing or rales.  Abdominal:     General: There is no distension.     Palpations: Abdomen is soft.  Musculoskeletal:        General: No tenderness.     Cervical back: Normal range of motion and neck supple.     Right lower leg: Edema (trace pitting) present.  Left lower leg: Edema (trace pitting) present.  Skin:    General: Skin is warm and dry.  Neurological:     Mental Status: She is alert and oriented to person, place, and time. Mental status is at baseline.  Psychiatric:        Mood and Affect: Mood normal.        Behavior: Behavior normal.    Assessment & Plan:  1: Chronic heart failure with reduced ejection fraction- - NYHA class I - euvolemic today - must be moved with hoyer lift for any movement and she prefers to not be lifted every day to be weighed - unsure of sodium content of foods but whoever feeds her doesn't add any salt to her food - has to have meds crushed and placed in applesauce - on GDMT of carvedilol & entresto - BP may not allow for SGLT2 or spironolactone (K+ has also been high in the past) - wearing bipap during the day but intermittently at bedtime  2: HTN- - BP low today (97/55); will decrease her BID furosemide to 40mg  QAM with additional 40mg  QPM PRN for worsening edema or shortness of breath - facility BP log reviewed and shows BP of 108-142/58-80 - seeing PCP at [facility] - BMP 10/15/21 (@ [facility]) reviewed and showed sodium  140, potassium 4.6, creatinine 0.4 and GFR >90 - will fax order for [facility] to check BMP in 2 weeks and fax [facility] to [facility] to make sure potassium doesn't go higher with reduction of furosemide  3: ALS- - currently bedbound with dysphagia - CXR report from [facility] on 08/26/21 showed interstitial edema, cardiomegaly and pulmonary vascular redistribution - palliative care visit done 09/24/21 - saw neurology ([place]) 10/10/20   Facility medication list reviewed.   Return in 3 months or sooner for any questions/problems before then.

## 2021-11-14 ENCOUNTER — Encounter: Payer: Self-pay | Admitting: Family

## 2021-11-14 ENCOUNTER — Ambulatory Visit: Payer: Medicare Other | Attending: Family | Admitting: Family

## 2021-11-14 ENCOUNTER — Other Ambulatory Visit: Payer: Self-pay

## 2021-11-14 VITALS — BP 97/55 | HR 52 | Resp 16 | Ht 60.0 in

## 2021-11-14 DIAGNOSIS — F32A Depression, unspecified: Secondary | ICD-10-CM | POA: Diagnosis not present

## 2021-11-14 DIAGNOSIS — E079 Disorder of thyroid, unspecified: Secondary | ICD-10-CM | POA: Diagnosis not present

## 2021-11-14 DIAGNOSIS — I10 Essential (primary) hypertension: Secondary | ICD-10-CM | POA: Diagnosis not present

## 2021-11-14 DIAGNOSIS — Z7401 Bed confinement status: Secondary | ICD-10-CM | POA: Insufficient documentation

## 2021-11-14 DIAGNOSIS — B1920 Unspecified viral hepatitis C without hepatic coma: Secondary | ICD-10-CM | POA: Diagnosis not present

## 2021-11-14 DIAGNOSIS — G1221 Amyotrophic lateral sclerosis: Secondary | ICD-10-CM | POA: Diagnosis not present

## 2021-11-14 DIAGNOSIS — I110 Hypertensive heart disease with heart failure: Secondary | ICD-10-CM | POA: Insufficient documentation

## 2021-11-14 DIAGNOSIS — F29 Unspecified psychosis not due to a substance or known physiological condition: Secondary | ICD-10-CM | POA: Insufficient documentation

## 2021-11-14 DIAGNOSIS — I5022 Chronic systolic (congestive) heart failure: Secondary | ICD-10-CM | POA: Diagnosis not present

## 2021-11-14 DIAGNOSIS — E785 Hyperlipidemia, unspecified: Secondary | ICD-10-CM | POA: Insufficient documentation

## 2021-11-14 DIAGNOSIS — Z79899 Other long term (current) drug therapy: Secondary | ICD-10-CM | POA: Insufficient documentation

## 2021-11-14 DIAGNOSIS — F419 Anxiety disorder, unspecified: Secondary | ICD-10-CM | POA: Insufficient documentation

## 2021-11-14 DIAGNOSIS — R1310 Dysphagia, unspecified: Secondary | ICD-10-CM | POA: Diagnosis not present

## 2021-11-15 ENCOUNTER — Other Ambulatory Visit: Payer: Self-pay | Admitting: Internal Medicine

## 2021-11-30 ENCOUNTER — Encounter: Payer: Self-pay | Admitting: Nurse Practitioner

## 2021-11-30 ENCOUNTER — Telehealth: Payer: Self-pay | Admitting: Family

## 2021-11-30 ENCOUNTER — Non-Acute Institutional Stay: Payer: Medicare Other | Admitting: Nurse Practitioner

## 2021-11-30 VITALS — BP 128/72 | HR 82 | Temp 97.0°F | Resp 18 | Wt 184.9 lb

## 2021-11-30 DIAGNOSIS — R5381 Other malaise: Secondary | ICD-10-CM

## 2021-11-30 DIAGNOSIS — G1221 Amyotrophic lateral sclerosis: Secondary | ICD-10-CM

## 2021-11-30 DIAGNOSIS — Z515 Encounter for palliative care: Secondary | ICD-10-CM

## 2021-11-30 DIAGNOSIS — R1310 Dysphagia, unspecified: Secondary | ICD-10-CM

## 2021-11-30 NOTE — Telephone Encounter (Signed)
Labs received from [facility] dated 11/22/21:  Hemoglobin 13.5 WBC 11.1 Platelet 241 Cholesterol 179 LDL 111 HDL 39 Triglycerides 144 A1c 6.3%   Have requested BMP results be sent to [place].

## 2021-11-30 NOTE — Progress Notes (Signed)
[facility] Consult Note Telephone: [phone]  Fax: [phone]    Date of encounter: 11/30/21 7:22 PM PATIENT NAME: Cynthia Landry [address] [place] [address]   [phone] (home)  DOB: February 03, 1956 MRN: [id number] PRIMARY CARE PROVIDER:    [facility]  RESPONSIBLE PARTY:    Contact Information     Name Relation Home Work Mobile   Burleson C Daughter [phone]        I met face to face with patient and family in facility. Palliative Care was asked to follow this patient by consultation request of  [place] to address advance care planning and complex medical decision making. This is a follow up visit.                                  ASSESSMENT AND PLAN / RECOMMENDATIONS: Symptom Management/Plan: ACP: DNR   Palliative care 5 / 7 / 2019 until present Palliative care 11 / 21 / 2017 to 11 / 9 / 2018 Hospice 11 / 11 / 2018 to 5 / 1 / 2019   2. Dysphagia; secondary to ALS. Continue to monitor weights, appetite, aspiration precautions.    3. Debility secondary to ALS, encourage passive rom; encourage to transfer to geri-chair.    4. Palliative care encounter; Palliative medicine team will continue to support patient, patient's family, and medical team. Visit consisted of counseling and education dealing with the complex and emotionally intense issues of symptom management and palliative care in the setting of serious and potentially life-threatening illness   5. F/u 2 months for ongoing monitoring weight, dysphagia, ALS progression  Follow up Palliative Care Visit: Palliative care will continue to follow for complex medical decision making, advance care planning, and clarification of goals. Return 8 weeks or prn.  I spent 40 minutes providing this consultation starting at 12:15 pm. More than 50% of the time in this consultation was spent in counseling and care coordination.  PPS:  30%  Chief Complaint: Follow up palliative consult for complex medical decision making  HISTORY OF PRESENT ILLNESS:  Cynthia Landry is a 66 y.o. year old female  with  multiple medical problems including. ALS, dysphasia, hepatitis C, left humerus pathological fractures, muscle weakness. Cynthia Landry continues to reside at [place] at [facility]. Cynthia Landry remains Bed-bound, total ADL dependents including bathing, dressing, toileting as she is incontinent of bowel and bladder. Cynthia Landry requires to be fed by staff. Cynthia Landry is a DNR. At present, Cynthia Landry is lying in bed, appears debilitated, comfortable. No visitors present. I visited and observed Cynthia Landry. We talked about purpose of PC visit. Cynthia Landry in agreement. Cynthia Landry endorses she has overall been feeling well, smiling. Appetite has improved. Cynthia Landry denies symptoms of pain, shortness of breath, no worsening dysphagia. We talked about Cynthia Landry it has been some time since she has been hoyered to a recliner. We talked about seeing if staff would get her oob, she declined. We talked about residing at [facility]. We talked about her concerns relying on others for for basic needs. Cynthia Landry and I talked about ALS, progression, concerns with disease progression. We talked about quality of life, what brings her joy. Cynthia Landry endorses she is very happy when her daughter, grandchildren visit. We talked about support system as she has been at [facility]  for several years. Medical goals reviewed. We talked about role pc in poc. I have attempted to reach daughter, I updated staff, no new changes recommended today.   History obtained from review of EMR, discussion with facility staff and  Cynthia Landry.  I reviewed available labs, medications, imaging, studies and related documents from the EMR.  Records reviewed and summarized above.   ROS 10 point system reviewed with staff and Cynthia Landry, all negative except HPI  Physical  Exam: Constitutional: NAD General: debilitated, bed-bound pleasant female EYES: ids intact ENMT: oral mucous membranes moist CV: S1S2, RRR Pulmonary: LCTA, no increased work of breathing, no cough, room air Abdomen:  normo-active BS + 4 quadrants, soft and non tender MSK: +functional quadriplegic Skin: warm and dry Neuro:  + generalized weakness,  + cognitive impairment Psych: non-anxious affect, A and O x 3  Thank you for the opportunity to participate in the care of Cynthia Landry.  The palliative care team will continue to follow. Please call our office at [phone] if we can be of additional assistance.   Questions and concerns were addressed. Provided general support and encouragement, no other unmet needs identified   This chart was dictated using voice recognition software.  Despite best efforts to proofread,  errors can occur which can change the documentation meaning.   Jacinta Penalver Ihor Gully, NP

## 2021-12-03 ENCOUNTER — Other Ambulatory Visit: Payer: Self-pay

## 2021-12-05 ENCOUNTER — Other Ambulatory Visit: Payer: Self-pay | Admitting: Family Medicine

## 2022-02-01 ENCOUNTER — Other Ambulatory Visit: Payer: Self-pay

## 2022-02-01 ENCOUNTER — Non-Acute Institutional Stay: Payer: Commercial Managed Care - HMO | Admitting: Nurse Practitioner

## 2022-02-01 ENCOUNTER — Encounter: Payer: Self-pay | Admitting: Nurse Practitioner

## 2022-02-01 VITALS — BP 128/72 | HR 88 | Temp 97.0°F | Resp 18 | Wt 190.6 lb

## 2022-02-01 DIAGNOSIS — F4321 Adjustment disorder with depressed mood: Secondary | ICD-10-CM

## 2022-02-01 DIAGNOSIS — R1310 Dysphagia, unspecified: Secondary | ICD-10-CM

## 2022-02-01 DIAGNOSIS — R5381 Other malaise: Secondary | ICD-10-CM

## 2022-02-01 DIAGNOSIS — G1221 Amyotrophic lateral sclerosis: Secondary | ICD-10-CM

## 2022-02-01 DIAGNOSIS — Z515 Encounter for palliative care: Secondary | ICD-10-CM

## 2022-02-01 NOTE — Progress Notes (Addendum)
? ? ?Manufacturing engineer ?Community Palliative Care Consult Note ?Telephone: [phone]  ?Fax: [phone]  ? ? ?Date of encounter: 02/01/22 ?12:01 PM ?PATIENT NAME: Cynthia Landry ?[address] ?[place] [address]   [phone] (home)  ?DOB: 07-05-1956 ?MRN: [id number] ?PRIMARY CARE PROVIDER:    ?[facility] ? ?RESPONSIBLE PARTY:    ?Contact Information   ? ? Name Relation Home Work Mobile  ? Lucretia Roers Daughter [phone]    ? ?  ? ?I met face to face with patient in facility. Palliative Care was asked to follow this patient by consultation request of [place] to address advance care planning and complex medical decision making. This is a follow up visit.                                  ?ASSESSMENT AND PLAN / RECOMMENDATIONS:  ?Symptom Management/Plan: ?ACP: DNR ?  ?Palliative care 5 / 7 / 2019 until present ?Palliative care 11 / 21 / 2017 to 11 / 9 / 2018 ?Hospice 11 / 11 / 2018 to 5 / 1 / 2019 ?  ?2. Dysphagia; secondary to ALS. Continue to monitor weights, appetite, aspiration precautions.  ?  ?3. Debility secondary to ALS, encourage passive rom; encourage to transfer to geri-chair.  ? ?4. Grieving, recent death of her room mate this am, discussed grieving, coping, support provided. Continue to provide support. ?  ?5. Palliative care encounter; Palliative medicine team will continue to support patient, patient's family, and medical team. Visit consisted of counseling and education dealing with the complex and emotionally intense issues of symptom management and palliative care in the setting of serious and potentially life-threatening illness ?  ?6. F/u 2 months for ongoing monitoring weight, dysphagia, ALS progression ? ?Follow up Palliative Care Visit: Palliative care will continue to follow for complex medical decision making, advance care planning, and clarification of goals. Return 8 weeks or prn. ? ?I spent 43 minutes providing this consultation start  at 9:30 am. More than 50% of the time in this consultation was spent in counseling and care coordination. ?PPS: 30% ? ?Chief Complaint: Follow up palliative consult for complex medical decision making ? ?HISTORY OF PRESENT ILLNESS:  CHEMEKA FILICE is a 66 y.o. year old female  with multiple medical problems including. ALS, dysphasia, hepatitis C, left humerus pathological fractures, muscle weakness. Ms. Goetzke continues to reside at [place] at [facility]. Ms. Riester remains Bed-bound, total ADL dependents including bathing, dressing, toileting as she is incontinent of bowel and bladder. Ms. Riedlinger requires to be fed by staff. Ms. Kagel is a DNR. At present, Ms. Rylee is lying in bed, appears debilitated, comfortable. No visitors present. I visited and observed Ms. Warbington. We talked about purpose of PC visit. Ms. Rewis in agreement. Ms. Tyson endorses her room mate died this am, was taken to the hospital. We talked about grieving as they have been room-mates for some time. We talked about coping skills, grief. Ms. Deshmukh endorses she has overall been feeling well, smiling. We talked about ros, symptoms, no new changes, no worsening dysphagia. She has not been OOB and declines to get OOB.  Ms. Deans and I talked about ALS, progression, concerns with disease progression. We talked about quality of life.  We talked about when her daughter, grandchildren visit. We talked about support system as she has been at [facility]  for several years. Medical goals reviewed. We talked about role pc in poc. I have attempted to reach daughter, I updated staff, no new changes recommended today.   ? ?History obtained from review of EMR, discussion with facility staff and Ms. Vaquera.  ?I reviewed available labs, medications, imaging, studies and related documents from the EMR.  Records reviewed and summarized above.  ? ?ROS ?10 point system reviewed with Ms. Wechter and facility staff all negative except  HPI ? ?Physical Exam: ?Constitutional: NAD ?General: debilitated, obese pleasant female ?EYES: lids intact ?ENMT: oral mucous membranes moist ?CV: S1S2, RRR ?Pulmonary: clear, decrease bases, no increased work of breathing, no cough ?Abdomen: normo-active BS + 4 quadrants, soft and non tender ?MSK: +functional quadriplegic ?Skin: warm and dry ?Neuro:  + generalized weakness,  no cognitive impairment ?Psych: non-anxious affect, A and O x 3 ?Thank you for the opportunity to participate in the care of Ms. Dovel.  The palliative care team will continue to follow. Please call our office at [phone] if we can be of additional assistance.  ? ?Aerica Rincon Z Darinda [place], NP  ?   ?

## 2022-02-04 NOTE — Progress Notes (Signed)
Patient ID: Cynthia Landry, female    DOB: July 12, 1956, 66 y.o.   MRN: [id number]  HPI  Ms Fitt is a 66 y/o female with a history of hyperlipidemia, HTN, thyroid disease, ALS, anxiety, depression, hepatitis C, psychosis and heart failure.   Echo report (from [facility]) from 06/29/21 reviewed and showed an EF of 30%.  Has not been admitted or been in the ED in the last 6 months.   She presents today via EMS on a stretcher for a follow-up visit with a chief complaint of weakness.She describes this as chronic in nature having been present for several years. She has associated pedal edema and weakness along with this. She denies any difficulty sleeping, dizziness, abdominal distention, palpitations, chest pain, shortness of breath, cough or fatigue.   Weighed intermittently at facility due to immobility and requiring a hoyer lift.   Past Medical History:  Diagnosis Date   ALS (amyotrophic lateral sclerosis) (HCC)    Amyotrophic lateral sclerosis (HCC)    Anxiety    Ataxia    CHF (congestive heart failure) (HCC)    Chronic pain    Depression    Diabetes mellitus without complication (HCC)    GERD (gastroesophageal reflux disease)    Hepatitis C    Hyperlipidemia    Hypertension    Psychosis (HCC)    PVD (peripheral vascular disease) (HCC)    Thyroid disease    Vitamin D deficiency     Past Surgical History:  Procedure Laterality Date   COLONOSCOPY     ESOPHAGOGASTRODUODENOSCOPY (EGD) WITH PROPOFOL N/A 12/30/2017   Procedure: ESOPHAGOGASTRODUODENOSCOPY (EGD) WITH PROPOFOL;  Surgeon: Christena Deem, MD;  Location: [facility] ENDOSCOPY;  Service: Endoscopy;  Laterality: N/A;   SHOULDER SURGERY     Family History  Problem Relation Age of Onset   CAD Father    Diabetes Mellitus II Mother    Stroke Mother    Social History   Tobacco Use   Smoking status: Never   Smokeless tobacco: Never  Substance Use Topics   Alcohol use: No   Allergies  Allergen Reactions   Aspirin Other (See  Comments)    dizziness   Hctz [Hydrochlorothiazide] Other (See Comments)    cramping   Lisinopril Cough   Prior to Admission medications   Medication Sig Start Date End Date Taking? Authorizing Provider  atorvastatin (LIPITOR) 10 MG tablet Take 1 tablet by mouth daily. 03/24/21  Yes [provider]  carvedilol (COREG) 6.25 MG tablet Take 6.25 mg by mouth 2 (two) times daily with a meal.   Yes [provider]  cyclobenzaprine (FLEXERIL) 5 MG tablet Take 5 mg by mouth 3 (three) times daily.   Yes [provider]  divalproex (DEPAKOTE) 250 MG DR tablet Take 250 mg by mouth daily. 03/25/21  Yes [provider]  DULoxetine (CYMBALTA) 30 MG capsule Take 60 mg by mouth every morning.   Yes [provider]  furosemide (LASIX) 80 MG tablet Take 0.5 tablets (40 mg total) by mouth daily. Patient taking differently: Take 40 mg by mouth 2 (two) times daily. 04/04/21  Yes Arnetha Courser, MD  gabapentin (NEURONTIN) 300 MG capsule Take 300 mg by mouth at bedtime.   Yes [provider]  HYDROcodone-acetaminophen (NORCO/VICODIN) 5-325 MG tablet Take 1-2 tablets by mouth every 4 (four) hours as needed for moderate pain or severe pain. 04/04/21  Yes Arnetha Courser, MD  insulin glargine (LANTUS) 100 UNIT/ML injection Inject 10 Units into the skin at bedtime.  Yes [provider]  insulin lispro (HUMALOG) 100 UNIT/ML injection Inject 5 Units into the skin 3 (three) times daily before meals.   Yes [provider]  latanoprost (XALATAN) 0.005 % ophthalmic solution SMARTSIG:In Eye(s) 02/17/21  Yes [provider]  levothyroxine (SYNTHROID) 150 MCG tablet Take 150 mcg by mouth daily. 03/15/21  Yes [provider]  Melatonin 10 MG SUBL Place 10 mg under the tongue. qhs   Yes [provider]  metFORMIN (GLUCOPHAGE) 500 MG tablet Take 500 mg by mouth daily.   Yes [provider]  metoCLOPramide (REGLAN) 5 MG tablet Take 5 mg  by mouth 4 (four) times daily.   Yes [provider]  nystatin cream (MYCOSTATIN) Apply topically. 03/19/21  Yes [provider]  polyethylene glycol powder (GLYCOLAX/MIRALAX) 17 GM/SCOOP powder Take by mouth.   Yes [provider]  sacubitril-valsartan (ENTRESTO) 24-26 MG Take 1 tablet by mouth 2 (two) times daily. 09/07/21  Yes Hackney, Inetta Fermo A, FNP  sertraline (ZOLOFT) 25 MG tablet Take 25 mg by mouth daily.   Yes [provider]  Docusate Sodium 100 MG capsule Take 100 mg by mouth every 12 (twelve) hours as needed for constipation.    [provider]  Ipratropium-Albuterol (COMBIVENT) 20-100 MCG/ACT AERS respimat Inhale 1 puff into the lungs every 6 (six) hours.    [provider]  Multiple Vitamin (MULTIVITAMIN) tablet Take 1 tablet by mouth daily.    [provider]    Review of Systems  Constitutional:  Negative for appetite change and fatigue.  HENT:  Positive for trouble swallowing. Negative for congestion, postnasal drip and sinus pressure.   Eyes: Negative.   Respiratory:  Negative for cough, chest tightness and shortness of breath.   Cardiovascular:  Positive for leg swelling. Negative for chest pain and palpitations.  Gastrointestinal:  Negative for abdominal distention and abdominal pain.  Endocrine: Negative.   Genitourinary: Negative.   Musculoskeletal:  Negative for back pain and neck pain.  Skin: Negative.   Allergic/Immunologic: Negative.   Neurological:  Positive for weakness. Negative for dizziness and light-headedness.  Hematological:  Negative for adenopathy. Does not bruise/bleed easily.  Psychiatric/Behavioral:  Negative for dysphoric mood and sleep disturbance. The patient is not nervous/anxious.    Vitals:   02/05/22 1240  BP: 121/77  Pulse: (!) 115  Resp: 20  SpO2: 97%   Wt Readings from Last 3 Encounters:  02/01/22 190 lb 9.6 oz (86.5 kg)  11/30/21 184 lb 14.4 oz (83.9 kg)  09/24/21 169 lb  (76.7 kg)    Lab Results  Component Value Date   CREATININE 0.43 (L) 04/03/2021   CREATININE 0.58 04/02/2021   CREATININE 0.43 (L) 11/14/2015   Physical Exam Vitals and nursing note reviewed. Exam conducted with a chaperone present (boyfriend present).  Constitutional:      General: She is not in acute distress.    Appearance: Normal appearance. She is ill-appearing.  HENT:     Head: Normocephalic and atraumatic.  Cardiovascular:     Rate and Rhythm: Tachycardia present.  Pulmonary:     Effort: Pulmonary effort is normal. No respiratory distress.     Breath sounds: No wheezing or rales.  Abdominal:     General: There is no distension.     Palpations: Abdomen is soft.  Musculoskeletal:        General: No tenderness.     Cervical back: Normal range of motion and neck supple.     Right lower leg:  Edema (trace pitting) present.     Left lower leg: Edema (trace pitting) present.  Skin:    General: Skin is warm and dry.  Neurological:     Mental Status: She is alert and oriented to person, place, and time. Mental status is at baseline.  Psychiatric:        Mood and Affect: Mood normal.        Behavior: Behavior normal.    Assessment & Plan: 1: Chronic heart failure with reduced ejection fraction- - NYHA class I - euvolemic today - must be moved with hoyer lift for any movement and she prefers to not be lifted every day to be weighed - unsure of sodium content of foods but whoever feeds her doesn't add any salt to her food - has to have meds crushed and placed in applesauce - on GDMT of carvedilol & entresto - palliative care services involved ([facility]) 02/01/22  2: HTN- - BP 121/77 - tachycardiac today (115), will increase her coreg to 12.5 BID - seeing PCP at [facility] - BMP 11/15/21 from [facility] Na 141, K 5.0, Cr 0.43, gfr > 90  3: ALS- - currently bedbound with dysphagia - CXR report from [facility] on 08/26/21 showed interstitial edema, cardiomegaly and pulmonary  vascular redistribution - palliative care ([facility]) 02/01/22 - saw neurology ([facility]) 10/10/20 - wearing bipap at bedtime   Facility medication list reviewed.   Discussed with patient and SO that due to her HF stability and the hardship of being transported from her facility to HF clinic that for now we will not schedule a f/u appt. Patient and SO will discuss with her dtr and HCPOA if they would like to return or not.

## 2022-02-05 ENCOUNTER — Other Ambulatory Visit: Payer: Self-pay

## 2022-02-05 ENCOUNTER — Ambulatory Visit: Payer: Commercial Managed Care - HMO | Attending: Family | Admitting: Family

## 2022-02-05 ENCOUNTER — Encounter: Payer: Self-pay | Admitting: Family

## 2022-02-05 VITALS — BP 121/77 | HR 115 | Resp 20

## 2022-02-05 DIAGNOSIS — F419 Anxiety disorder, unspecified: Secondary | ICD-10-CM | POA: Diagnosis not present

## 2022-02-05 DIAGNOSIS — Z7401 Bed confinement status: Secondary | ICD-10-CM | POA: Insufficient documentation

## 2022-02-05 DIAGNOSIS — R000 Tachycardia, unspecified: Secondary | ICD-10-CM | POA: Diagnosis not present

## 2022-02-05 DIAGNOSIS — I110 Hypertensive heart disease with heart failure: Secondary | ICD-10-CM | POA: Diagnosis not present

## 2022-02-05 DIAGNOSIS — E079 Disorder of thyroid, unspecified: Secondary | ICD-10-CM | POA: Insufficient documentation

## 2022-02-05 DIAGNOSIS — I10 Essential (primary) hypertension: Secondary | ICD-10-CM | POA: Diagnosis not present

## 2022-02-05 DIAGNOSIS — I5022 Chronic systolic (congestive) heart failure: Secondary | ICD-10-CM | POA: Insufficient documentation

## 2022-02-05 DIAGNOSIS — R1310 Dysphagia, unspecified: Secondary | ICD-10-CM | POA: Diagnosis not present

## 2022-02-05 DIAGNOSIS — G1221 Amyotrophic lateral sclerosis: Secondary | ICD-10-CM | POA: Insufficient documentation

## 2022-02-05 DIAGNOSIS — E785 Hyperlipidemia, unspecified: Secondary | ICD-10-CM | POA: Diagnosis not present

## 2022-02-05 DIAGNOSIS — F32A Depression, unspecified: Secondary | ICD-10-CM | POA: Diagnosis not present

## 2022-02-05 DIAGNOSIS — B1920 Unspecified viral hepatitis C without hepatic coma: Secondary | ICD-10-CM | POA: Insufficient documentation

## 2022-02-05 MED ORDER — CARVEDILOL 12.5 MG PO TABS: 12.5000 mg | ORAL_TABLET | Freq: Two times a day (BID) | ORAL | 3 refills | Status: AC

## 2022-03-11 ENCOUNTER — Encounter: Payer: Self-pay | Admitting: Nurse Practitioner

## 2022-03-11 ENCOUNTER — Non-Acute Institutional Stay: Payer: Commercial Managed Care - HMO | Admitting: Nurse Practitioner

## 2022-03-11 VITALS — BP 114/56 | HR 78 | Temp 98.0°F | Resp 18 | Wt 194.0 lb

## 2022-03-11 DIAGNOSIS — R5381 Other malaise: Secondary | ICD-10-CM

## 2022-03-11 DIAGNOSIS — R1310 Dysphagia, unspecified: Secondary | ICD-10-CM

## 2022-03-11 DIAGNOSIS — G1221 Amyotrophic lateral sclerosis: Secondary | ICD-10-CM

## 2022-03-11 NOTE — Progress Notes (Addendum)
? ? ?Manufacturing engineer ?Community Palliative Care Consult Note ?Telephone: [phone]  ?Fax: [phone]  ? ? ?Date of encounter: 03/11/22 ?2:48 PM ?PATIENT NAME: Cynthia Landry ?[address] ?[place] [address]   ?[phone] (home)  ?DOB: Apr 24, 1956 ?MRN: [id number] ?PRIMARY CARE PROVIDER:    ?[facility]  ? ?RESPONSIBLE PARTY:    ?Contact Information   ? ? Name Relation Home Work Mobile  ? Cynthia Landry Daughter [phone]    ? ?  ? ?I met face to face with patient in facility. Palliative Care was asked to follow this patient by consultation request of  Cynthia Landry to address advance care planning and complex medical decision making. This is a follow up visit.                                  ?ASSESSMENT AND PLAN / RECOMMENDATIONS:  ?Symptom Management/Plan: ?ACP: DNR ?  ?Palliative care 5 / 7 / 2019 until present ?Palliative care 11 / 21 / 2017 to 11 / 9 / 2018 ?Hospice 11 / 11 / 2018 to 5 / 1 / 2019 ?  ?2. Dysphagia; secondary to ALS. Continue to monitor weights, appetite, aspiration precautions.  ?  ?3. Debility secondary to ALS, encourage passive rom; encourage to transfer to geri-chair.  ?  ?4. Palliative care encounter; Palliative medicine team will continue to support patient, patient's family, and medical team. Visit consisted of counseling and education dealing with the complex and emotionally intense issues of symptom management and palliative care in the setting of serious and potentially life-threatening illness ?  ?5. F/u 1 months for ongoing monitoring weight, dysphagia, ALS progression ? ?Follow up Palliative Care Visit: Palliative care will continue to follow for complex medical decision making, advance care planning, and clarification of goals. Return 4 weeks or prn. ? ?I spent 47 minutes providing this consultation. More than 50% of the time in this consultation was spent in counseling and care coordination. ?PPS: 30% ? ?Chief Complaint: Follow up  palliative consult for complex medical decision making ? ?HISTORY OF PRESENT ILLNESS:  DAILY Landry is a 66 y.o. year old female  with multiple medical problems including. ALS, dysphasia, hepatitis C, left humerus pathological fractures, muscle weakness. Cynthia Landry continues to reside at [place] at [facility]. Cynthia Landry remains Bed-bound, total ADL dependents including bathing, dressing, toileting as she is incontinent of bowel and bladder. Cynthia Landry requires to be fed by staff. Cynthia Landry is a DNR. At present, Cynthia Landry is lying in bed, appears debilitated, comfortable. No visitors present. I visited and observed Cynthia Landry. We talked about purpose of PC visit. Cynthia Landry in agreement. Cynthia Landry endorses she has overall been feeling well, smiling. We talked about ros, symptoms, no new changes, no worsening dysphagia. Cynthia Landry appears to be having more difficulty with her speech, slower with words, processing. Cynthia Landry and I talked about ALS, progression, concerns with disease progression. We talked about quality of life.  We talked about when her daughter visit this past weekend. We talked about what brings Cynthia Landry joy. Medical goals reviewed. We talked about role pc in poc. I have attempted to reach daughter, I updated staff, no new changes recommended today.    ? ?History obtained from review of EMR, discussion with facility staff and Cynthia Landry.  ?I reviewed available labs, medications, imaging, studies and related  documents from the EMR.  Records reviewed and summarized above.  ? ?ROS ?10 point system reviewed with Cynthia Landry, facility staff all negative except HPI ? ?Physical Exam: ?Constitutional: NAD ?General: obese debilitated famale ?EYES: lids intact ?ENMT: oral mucous membranes moist ?CV: S1S2, RRR ?Pulmonary: clear, decreased bases, no increased work of breathing, no cough, room air ?Abdomen: normo-active BS + 4 quadrants, soft and non tender ?MSK: paraplegic ?Skin:  warm and dry ?Neuro:  + generalized weakness,  no cognitive impairment ?Psych: non-anxious affect, A and O x 2 ?Thank you for the opportunity to participate in the care of Cynthia Landry.  The palliative care team will continue to follow. Please call our office at [phone] if we can be of additional assistance.  ? ?Enis Riecke Z Donalda Job, NP   ?

## 2022-06-10 ENCOUNTER — Inpatient Hospital Stay: Payer: Medicare Other

## 2022-06-10 ENCOUNTER — Emergency Department: Payer: Medicare Other

## 2022-06-10 ENCOUNTER — Other Ambulatory Visit: Payer: Self-pay

## 2022-06-10 ENCOUNTER — Encounter: Payer: Self-pay | Admitting: Intensive Care

## 2022-06-10 ENCOUNTER — Inpatient Hospital Stay
Admission: EM | Admit: 2022-06-10 | Discharge: 2022-06-14 | DRG: 689 | Disposition: A | Discharge status: Skilled Nursing Facility | Payer: Medicare Other | Source: Skilled Nursing Facility | Attending: Internal Medicine | Admitting: Internal Medicine

## 2022-06-10 DIAGNOSIS — B9620 Unspecified Escherichia coli [E. coli] as the cause of diseases classified elsewhere: Secondary | ICD-10-CM | POA: Diagnosis present

## 2022-06-10 DIAGNOSIS — R404 Transient alteration of awareness: Secondary | ICD-10-CM

## 2022-06-10 DIAGNOSIS — Z7984 Long term (current) use of oral hypoglycemic drugs: Secondary | ICD-10-CM

## 2022-06-10 DIAGNOSIS — J449 Chronic obstructive pulmonary disease, unspecified: Secondary | ICD-10-CM | POA: Diagnosis present

## 2022-06-10 DIAGNOSIS — D72829 Elevated white blood cell count, unspecified: Secondary | ICD-10-CM | POA: Diagnosis present

## 2022-06-10 DIAGNOSIS — Z79899 Other long term (current) drug therapy: Secondary | ICD-10-CM

## 2022-06-10 DIAGNOSIS — F32A Depression, unspecified: Secondary | ICD-10-CM | POA: Diagnosis present

## 2022-06-10 DIAGNOSIS — R5381 Other malaise: Secondary | ICD-10-CM

## 2022-06-10 DIAGNOSIS — I509 Heart failure, unspecified: Secondary | ICD-10-CM

## 2022-06-10 DIAGNOSIS — F419 Anxiety disorder, unspecified: Secondary | ICD-10-CM | POA: Diagnosis present

## 2022-06-10 DIAGNOSIS — R4182 Altered mental status, unspecified: Secondary | ICD-10-CM | POA: Diagnosis present

## 2022-06-10 DIAGNOSIS — I110 Hypertensive heart disease with heart failure: Secondary | ICD-10-CM | POA: Diagnosis present

## 2022-06-10 DIAGNOSIS — Z888 Allergy status to other drugs, medicaments and biological substances status: Secondary | ICD-10-CM

## 2022-06-10 DIAGNOSIS — E860 Dehydration: Secondary | ICD-10-CM | POA: Diagnosis present

## 2022-06-10 DIAGNOSIS — F329 Major depressive disorder, single episode, unspecified: Secondary | ICD-10-CM | POA: Diagnosis not present

## 2022-06-10 DIAGNOSIS — M79605 Pain in left leg: Secondary | ICD-10-CM

## 2022-06-10 DIAGNOSIS — R000 Tachycardia, unspecified: Secondary | ICD-10-CM

## 2022-06-10 DIAGNOSIS — G9389 Other specified disorders of brain: Secondary | ICD-10-CM | POA: Diagnosis present

## 2022-06-10 DIAGNOSIS — R1310 Dysphagia, unspecified: Secondary | ICD-10-CM | POA: Diagnosis present

## 2022-06-10 DIAGNOSIS — K219 Gastro-esophageal reflux disease without esophagitis: Secondary | ICD-10-CM | POA: Diagnosis present

## 2022-06-10 DIAGNOSIS — G9341 Metabolic encephalopathy: Secondary | ICD-10-CM | POA: Diagnosis present

## 2022-06-10 DIAGNOSIS — E1151 Type 2 diabetes mellitus with diabetic peripheral angiopathy without gangrene: Secondary | ICD-10-CM | POA: Diagnosis present

## 2022-06-10 DIAGNOSIS — E876 Hypokalemia: Secondary | ICD-10-CM

## 2022-06-10 DIAGNOSIS — E039 Hypothyroidism, unspecified: Secondary | ICD-10-CM | POA: Diagnosis present

## 2022-06-10 DIAGNOSIS — E872 Acidosis, unspecified: Secondary | ICD-10-CM | POA: Diagnosis present

## 2022-06-10 DIAGNOSIS — Z8249 Family history of ischemic heart disease and other diseases of the circulatory system: Secondary | ICD-10-CM

## 2022-06-10 DIAGNOSIS — G1221 Amyotrophic lateral sclerosis: Secondary | ICD-10-CM | POA: Diagnosis present

## 2022-06-10 DIAGNOSIS — Z833 Family history of diabetes mellitus: Secondary | ICD-10-CM

## 2022-06-10 DIAGNOSIS — E119 Type 2 diabetes mellitus without complications: Secondary | ICD-10-CM | POA: Diagnosis not present

## 2022-06-10 DIAGNOSIS — J9811 Atelectasis: Secondary | ICD-10-CM | POA: Diagnosis present

## 2022-06-10 DIAGNOSIS — Z79890 Hormone replacement therapy (postmenopausal): Secondary | ICD-10-CM

## 2022-06-10 DIAGNOSIS — E785 Hyperlipidemia, unspecified: Secondary | ICD-10-CM | POA: Diagnosis present

## 2022-06-10 DIAGNOSIS — G8929 Other chronic pain: Secondary | ICD-10-CM | POA: Diagnosis present

## 2022-06-10 DIAGNOSIS — Z66 Do not resuscitate: Secondary | ICD-10-CM | POA: Diagnosis present

## 2022-06-10 DIAGNOSIS — E1165 Type 2 diabetes mellitus with hyperglycemia: Secondary | ICD-10-CM | POA: Diagnosis present

## 2022-06-10 DIAGNOSIS — Z886 Allergy status to analgesic agent status: Secondary | ICD-10-CM

## 2022-06-10 DIAGNOSIS — I5032 Chronic diastolic (congestive) heart failure: Secondary | ICD-10-CM

## 2022-06-10 DIAGNOSIS — N390 Urinary tract infection, site not specified: Secondary | ICD-10-CM | POA: Diagnosis present

## 2022-06-10 DIAGNOSIS — Z794 Long term (current) use of insulin: Secondary | ICD-10-CM

## 2022-06-10 DIAGNOSIS — Z1612 Extended spectrum beta lactamase (ESBL) resistance: Secondary | ICD-10-CM | POA: Diagnosis present

## 2022-06-10 DIAGNOSIS — R911 Solitary pulmonary nodule: Secondary | ICD-10-CM | POA: Diagnosis present

## 2022-06-10 DIAGNOSIS — I10 Essential (primary) hypertension: Secondary | ICD-10-CM | POA: Diagnosis not present

## 2022-06-10 DIAGNOSIS — Z8619 Personal history of other infectious and parasitic diseases: Secondary | ICD-10-CM

## 2022-06-10 HISTORY — DX: Altered mental status, unspecified: R41.82

## 2022-06-10 LAB — COMPREHENSIVE METABOLIC PANEL
ALT: 21 U/L (ref 0–44)
AST: 35 U/L (ref 15–41)
Albumin: 3.3 g/dL — ABNORMAL LOW (ref 3.5–5.0)
Alkaline Phosphatase: 51 U/L (ref 38–126)
Anion gap: 16 — ABNORMAL HIGH (ref 5–15)
BUN: 13 mg/dL (ref 8–23)
CO2: 42 mmol/L — ABNORMAL HIGH (ref 22–32)
Calcium: 8.8 mg/dL — ABNORMAL LOW (ref 8.9–10.3)
Chloride: 84 mmol/L — ABNORMAL LOW (ref 98–111)
Creatinine, Ser: 0.62 mg/dL (ref 0.44–1.00)
GFR, Estimated: >60 mL/min (ref >60)
Glucose, Bld: 205 mg/dL — ABNORMAL HIGH (ref 70–99)
Potassium: 2.2 mmol/L — CL (ref 3.5–5.1)
Sodium: 142 mmol/L (ref 135–145)
Total Bilirubin: 1.3 mg/dL — ABNORMAL HIGH (ref 0.3–1.2)
Total Protein: 8.2 g/dL — ABNORMAL HIGH (ref 6.5–8.1)

## 2022-06-10 LAB — BRAIN NATRIURETIC PEPTIDE: B Natriuretic Peptide: 18.5 pg/mL (ref 0.0–100.0)

## 2022-06-10 LAB — LACTIC ACID, PLASMA: Lactic Acid, Venous: 1.1 mmol/L (ref 0.5–1.9)

## 2022-06-10 LAB — HEMOGLOBIN A1C
Hgb A1c MFr Bld: 9.5 % — ABNORMAL HIGH (ref 4.8–5.6)
Mean Plasma Glucose: 225.95 mg/dL

## 2022-06-10 LAB — HIV ANTIBODY (ROUTINE TESTING W REFLEX): HIV Screen 4th Generation wRfx: NONREACTIVE

## 2022-06-10 LAB — PROTIME-INR
INR: 1.1 (ref 0.8–1.2)
Prothrombin Time: 13.7 seconds (ref 11.4–15.2)

## 2022-06-10 LAB — DIFFERENTIAL
Abs Immature Granulocytes: 0.03 10*3/uL (ref 0.00–0.07)
Basophils Absolute: 0.1 10*3/uL (ref 0.0–0.1)
Basophils Relative: 1 %
Eosinophils Absolute: 0.2 10*3/uL (ref 0.0–0.5)
Eosinophils Relative: 1 %
Immature Granulocytes: 0 %
Lymphocytes Relative: 42 %
Lymphs Abs: 5.1 10*3/uL — ABNORMAL HIGH (ref 0.7–4.0)
Monocytes Absolute: 1.8 10*3/uL — ABNORMAL HIGH (ref 0.1–1.0)
Monocytes Relative: 15 %
Neutro Abs: 4.9 10*3/uL (ref 1.7–7.7)
Neutrophils Relative %: 41 %

## 2022-06-10 LAB — CBC
HCT: 49.4 % — ABNORMAL HIGH (ref 36.0–46.0)
Hemoglobin: 15 g/dL (ref 12.0–15.0)
MCH: 27.8 pg (ref 26.0–34.0)
MCHC: 30.4 g/dL (ref 30.0–36.0)
MCV: 91.7 fL (ref 80.0–100.0)
Platelets: 306 10*3/uL (ref 150–400)
RBC: 5.39 MIL/uL — ABNORMAL HIGH (ref 3.87–5.11)
RDW: 12.8 % (ref 11.5–15.5)
WBC: 12 10*3/uL — ABNORMAL HIGH (ref 4.0–10.5)
nRBC: 0 % (ref 0.0–0.2)

## 2022-06-10 LAB — CK: Total CK: 220 U/L (ref 38–234)

## 2022-06-10 LAB — MAGNESIUM
Magnesium: 1.9 mg/dL (ref 1.7–2.4)
Magnesium: 1.7 mg/dL (ref 1.7–2.4)

## 2022-06-10 LAB — ETHANOL: Alcohol, Ethyl (B): <10 mg/dL (ref <10)

## 2022-06-10 LAB — BETA-HYDROXYBUTYRIC ACID: Beta-Hydroxybutyric Acid: 0.92 mmol/L — ABNORMAL HIGH (ref 0.05–0.27)

## 2022-06-10 LAB — TROPONIN I (HIGH SENSITIVITY)
Troponin I (High Sensitivity): 39 ng/L — ABNORMAL HIGH (ref <18)
Troponin I (High Sensitivity): 51 ng/L — ABNORMAL HIGH (ref <18)

## 2022-06-10 LAB — APTT: aPTT: 28 seconds (ref 24–36)

## 2022-06-10 MED ORDER — ACETAMINOPHEN 650 MG RE SUPP: 650.0000 mg | RECTAL | Status: DC | PRN

## 2022-06-10 MED ORDER — HEPARIN SODIUM (PORCINE) 5000 UNIT/ML IJ SOLN
5000.0000 [IU] | Freq: Three times a day (TID) | INTRAMUSCULAR | Status: DC
  Administered 2022-06-10 – 2022-06-11 (×2): 5000 [IU] via SUBCUTANEOUS
  Filled 2022-06-10 (×2): qty 1

## 2022-06-10 MED ORDER — POTASSIUM CHLORIDE 10 MEQ/100ML IV SOLN
10.0000 meq | INTRAVENOUS | Status: AC
  Administered 2022-06-10 – 2022-06-11 (×3): 10 meq via INTRAVENOUS
  Filled 2022-06-10 (×2): qty 100

## 2022-06-10 MED ORDER — SODIUM CHLORIDE 0.9 % IV BOLUS
1000.0000 mL | Freq: Once | INTRAVENOUS | Status: AC
  Administered 2022-06-10: 1000 mL via INTRAVENOUS

## 2022-06-10 MED ORDER — IOHEXOL 350 MG/ML SOLN
80.0000 mL | Freq: Once | INTRAVENOUS | Status: AC | PRN
  Administered 2022-06-10: 75 mL via INTRAVENOUS

## 2022-06-10 MED ORDER — ACETAMINOPHEN 160 MG/5ML PO SOLN: 650.0000 mg | ORAL | Status: DC | PRN

## 2022-06-10 MED ORDER — LACTATED RINGERS IV BOLUS: 1000.0000 mL | Freq: Once | INTRAVENOUS | Status: DC

## 2022-06-10 MED ORDER — ACETAMINOPHEN 325 MG PO TABS
650.0000 mg | ORAL_TABLET | ORAL | Status: DC | PRN
  Administered 2022-06-11: 650 mg via ORAL
  Filled 2022-06-10: qty 2

## 2022-06-10 MED ORDER — SODIUM CHLORIDE 0.9 % IV SOLN
INTRAVENOUS | Status: DC
  Administered 2022-06-12: 50 mL/h via INTRAVENOUS

## 2022-06-10 MED ORDER — INSULIN ASPART 100 UNIT/ML IJ SOLN
0.0000 [IU] | Freq: Three times a day (TID) | INTRAMUSCULAR | Status: DC
  Administered 2022-06-11 (×2): 2 [IU] via SUBCUTANEOUS
  Administered 2022-06-11: 5 [IU] via SUBCUTANEOUS
  Administered 2022-06-12 – 2022-06-13 (×5): 3 [IU] via SUBCUTANEOUS
  Administered 2022-06-14 (×2): 2 [IU] via SUBCUTANEOUS
  Filled 2022-06-10 (×9): qty 1

## 2022-06-10 MED ORDER — POTASSIUM CHLORIDE 10 MEQ/100ML IV SOLN
10.0000 meq | INTRAVENOUS | Status: AC
  Filled 2022-06-10: qty 100

## 2022-06-10 MED ORDER — DIVALPROEX SODIUM 250 MG PO DR TAB
250.0000 mg | DELAYED_RELEASE_TABLET | Freq: Every day | ORAL | Status: DC
  Administered 2022-06-10 – 2022-06-14 (×5): 250 mg via ORAL
  Filled 2022-06-10 (×6): qty 1

## 2022-06-10 MED ORDER — STROKE: EARLY STAGES OF RECOVERY BOOK: Freq: Once | Status: DC

## 2022-06-10 MED ORDER — LATANOPROST 0.005 % OP SOLN
1.0000 [drp] | Freq: Every day | OPHTHALMIC | Status: DC
  Administered 2022-06-10 – 2022-06-13 (×3): 1 [drp] via OPHTHALMIC
  Filled 2022-06-10 (×2): qty 2.5

## 2022-06-10 MED ORDER — MAGNESIUM SULFATE 2 GM/50ML IV SOLN
2.0000 g | Freq: Once | INTRAVENOUS | Status: AC
  Administered 2022-06-10: 2 g via INTRAVENOUS
  Filled 2022-06-10: qty 50

## 2022-06-10 MED ORDER — ATORVASTATIN CALCIUM 20 MG PO TABS
20.0000 mg | ORAL_TABLET | Freq: Every day | ORAL | Status: DC
Start: 2022-06-10 — End: 2022-06-14
  Administered 2022-06-10 – 2022-06-13 (×4): 20 mg via ORAL
  Filled 2022-06-10 (×4): qty 1

## 2022-06-10 MED ORDER — POTASSIUM CHLORIDE CRYS ER 20 MEQ PO TBCR
40.0000 meq | EXTENDED_RELEASE_TABLET | Freq: Once | ORAL | Status: DC
Start: 2022-06-10 — End: 2022-06-10

## 2022-06-10 NOTE — Assessment & Plan Note (Signed)
Stable no wheezing. SpO2: 99 % O2 Flow Rate (L/min): 2 L/min Currently on 2 L [place].

## 2022-06-10 NOTE — Assessment & Plan Note (Signed)
Stable and compensated. No edema JVD or 3rd heart sound. Hold diuresis  And daily weight. Strict I/O.

## 2022-06-10 NOTE — Assessment & Plan Note (Signed)
D/d include TIA/CVA/ Infection/ Seizures. We will obtain MRI and EEG. Blood/ Urine cultures.  Troponin. VTE eval negative so far.

## 2022-06-10 NOTE — Progress Notes (Signed)
[facility] ED [facility] Hospital Liaison note: ? ?This patient is currently enrolled in [facility] outpatient-based Palliative Care. Will continue to follow for disposition. ? ?Please call with any outpatient palliative questions or concerns. ? ?Thank you, ?Dee Curry, LPN ?[facility] Hospital Liaison ?[phone] ?

## 2022-06-10 NOTE — Assessment & Plan Note (Addendum)
Blood pressure 129/62, pulse 70, temperature 99 F (37.2 C), temperature source Axillary, resp. rate 11, height 5' (1.524 m), weight 88 kg, SpO2 99 %.  Vitals:   06/10/22 1158 06/10/22 1630  BP: 136/60 129/62  Blood pressure noted to be low normal on presentation and as such we will continue to hold antihypertensive medications of Lasix, Entresto.

## 2022-06-10 NOTE — ED Provider Triage Note (Signed)
Emergency Medicine Provider Triage Evaluation Note  Cynthia Landry , a 66 y.o. female  was evaluated in triage.  Arrives via EMS.  Was at [facility].  History of ALS.  Patient usually cannot raise her arms but is verbal.  Family states that she did not communicate with him yesterday.  Nonverbal this morning.  Had difficulty trying to eat..  Patient is DNR.  Paperwork is with the patient  Review of Systems  Positive: See above Negative: Fever  Physical Exam  There were no vitals taken for this visit. Gen:   Patient is nonverbal Resp:  Normal effort patient is on O2 MSK:   Patient does not move extremities secondary ALS Other:    Medical Decision Making  Medically screening exam initiated at 11:51 AM.  Appropriate orders placed.  Cynthia Landry was informed that the remainder of the evaluation will be completed by another provider, this initial triage assessment does not replace that evaluation, and the importance of remaining in the ED until their evaluation is complete.  We will evaluate for stroke.  Unknown last well.  Most likely is greater than 4 hours per the family   Faythe Ghee, Cordelia Poche 06/10/22 1152

## 2022-06-10 NOTE — Assessment & Plan Note (Addendum)
Elevated Ag. Lactic level pending.  Kidney function normal.  We will check for beta hydroxy butyrate

## 2022-06-10 NOTE — ED Triage Notes (Addendum)
Patient arrived by EMS from [facility] for AMS. Patient has history of ALS. Baseline bedbound and cannot move extremities but can usually speak and eat normally. Yesterday around 3pm when family arrived they noticed her speech was somewhat slurred. They reported around 6pm she was sleeping a lot and kept repeating the same phrase and also had episode of coughing/choking while eating. Today staff called family when patient could not speak or eat and EMS was called. HX diabetes     EMS vitals: CBG 245 128/52 blood pressure ETCO2 61 RR 16 HR 71 85% RA - placed on 5L O2 with sats of 100%

## 2022-06-10 NOTE — ED Provider Notes (Signed)
[facility] Provider Note   Event Date/Time   First MD Initiated Contact with Patient 06/10/22 1501     (approximate) History  Altered Mental Status  HPI Cynthia Landry is a 66 y.o. female with a past medical history of ALS who presents for altered mental status over the last 2 days.  Daughter at bedside states that patient has been less interactive than usual as she is normally talking and interacting however over the past 2 days she has not been speaking as much as she normally does.  Daughter is concerned that patient may be dehydrated as she feels that her facility has not been giving her as much fluid as she needs.  Denies patient having any recent vomiting/diarrhea.  Patient is unable to provide any further history review of systems secondary to mental status   Physical Exam  Triage Vital Signs: ED Triage Vitals  Enc Vitals Group     BP 06/10/22 1158 136/60     Pulse Rate 06/10/22 1158 69     Resp 06/10/22 1158 18     Temp 06/10/22 1158 97.9 F (36.6 C)     Temp Source 06/10/22 1158 Axillary     SpO2 06/10/22 1158 98 %     Weight 06/10/22 1159 194 lb 0.1 oz (88 kg)     Height 06/10/22 1159 5' (1.524 m)     Head Circumference --      Peak Flow --      Pain Score --      Pain Loc --      Pain Edu? --      Excl. in GC? --    Most recent vital signs: Vitals:   06/10/22 2200 06/10/22 2300  BP: (!) 154/73 (!) 162/73  Pulse: 76 72  Resp: 15 18  Temp:  98 F (36.7 C)  SpO2: 99% 100%   General: Awake, oriented x4. CV:  Good peripheral perfusion.  Resp:  Normal effort.  Abd:  No distention.  Other:  Elderly Caucasian female laying in bed in no acute distress.  Nonverbal but following commands to the best of her ability. ED Results / Procedures / Treatments  Labs (all labs ordered are listed, but only abnormal results are displayed) Labs Reviewed  CBC - Abnormal; Notable for the following components:      Result Value   WBC 12.0 (*)    RBC  5.39 (*)    HCT 49.4 (*)    All other components within normal limits  DIFFERENTIAL - Abnormal; Notable for the following components:   Lymphs Abs 5.1 (*)    Monocytes Absolute 1.8 (*)    All other components within normal limits  COMPREHENSIVE METABOLIC PANEL - Abnormal; Notable for the following components:   Potassium 2.2 (*)    Chloride 84 (*)    CO2 42 (*)    Glucose, Bld 205 (*)    Calcium 8.8 (*)    Total Protein 8.2 (*)    Albumin 3.3 (*)    Total Bilirubin 1.3 (*)    Anion gap 16 (*)    All other components within normal limits  URINALYSIS, ROUTINE W REFLEX MICROSCOPIC - Abnormal; Notable for the following components:   Color, Urine YELLOW (*)    APPearance HAZY (*)    Specific Gravity, Urine 1.038 (*)    Ketones, ur 5 (*)    Nitrite POSITIVE (*)    Bacteria, UA RARE (*)    All other components  within normal limits  HEMOGLOBIN A1C - Abnormal; Notable for the following components:   Hgb A1c MFr Bld 9.5 (*)    All other components within normal limits  BETA-HYDROXYBUTYRIC ACID - Abnormal; Notable for the following components:   Beta-Hydroxybutyric Acid 0.92 (*)    All other components within normal limits  TROPONIN I (HIGH SENSITIVITY) - Abnormal; Notable for the following components:   Troponin I (High Sensitivity) 39 (*)    All other components within normal limits  TROPONIN I (HIGH SENSITIVITY) - Abnormal; Notable for the following components:   Troponin I (High Sensitivity) 51 (*)    All other components within normal limits  URINE CULTURE  CULTURE, BLOOD (ROUTINE X 2)  CULTURE, BLOOD (ROUTINE X 2)  PROTIME-INR  APTT  ETHANOL  MAGNESIUM  MAGNESIUM  LACTIC ACID, PLASMA  CK  BRAIN NATRIURETIC PEPTIDE  HIV ANTIBODY (ROUTINE TESTING W REFLEX)  URINE DRUG SCREEN, QUALITATIVE ([facility] ONLY)  LACTIC ACID, PLASMA  LIPID PANEL  CBG MONITORING, ED   EKG ED ECG REPORT I, Merwyn Katos, the attending physician, personally viewed and interpreted this ECG. Date:  06/10/2022 EKG Time: 1204 Rate: 70 Rhythm: normal sinus rhythm QRS Axis: normal Intervals: normal ST/T Wave abnormalities: normal Narrative Interpretation: no evidence of acute ischemia RADIOLOGY ED MD interpretation: Single view portable chest x-ray interpreted by me and shows low lung volumes with subsegmental atelectasis  CT of the head without contrast interpreted by me shows no evidence of acute abnormalities including no intracerebral hemorrhage, obvious masses, or significant edema -Agree with radiology assessment Official radiology report(s): MR BRAIN WO CONTRAST  Result Date: 06/10/2022 CLINICAL DATA:  Initial evaluation for neuro deficit, stroke suspected. EXAM: MRI HEAD WITHOUT CONTRAST MRA HEAD WITHOUT CONTRAST TECHNIQUE: Multiplanar, multi-echo pulse sequences of the brain and surrounding structures were acquired without intravenous contrast. Angiographic images of the Circle of Willis were acquired using MRA technique without intravenous contrast. COMPARISON:  CT from earlier the same day. FINDINGS: MRI HEAD FINDINGS Brain: Cerebral volume within normal limits for age. Scattered patchy T2/FLAIR hyperintensity involving the periventricular deep white matter both cerebral hemispheres, most consistent with chronic small vessel ischemic disease, moderately advanced in nature. No evidence for acute or subacute ischemia. Gray-white matter differentiation maintained. No areas of chronic cortical infarction. No acute or chronic intracranial blood products. No mass lesion or midline shift. Diffuse ventricular prominence out of proportion to cortical sulcation noted. No extra-axial fluid collection. Pituitary gland and suprasellar region within normal limits. Vascular: Major intracranial vascular flow voids are maintained. Skull and upper cervical spine: Craniocervical junction normal. Bone marrow signal intensity within normal limits. No scalp soft tissue abnormality. Sinuses/Orbits: Globes and  orbital soft tissues within normal limits. Paranasal sinuses and mastoid air cells are clear. Other: None. MRA HEAD FINDINGS Anterior circulation: Both internal carotid arteries patent to the termini without stenosis. A1 segments patent bilaterally. Normal anterior communicating artery complex. Anterior cerebral arteries widely patent. No M1 stenosis or occlusion. No proximal MCA branch occlusion. Distal MCA branches perfused and symmetric. Posterior circulation: Both V4 segments patent without stenosis. Left PICA patent. Right PICA not well seen. Basilar patent to its distal aspect without stenosis. Superior cerebellar and posterior cerebral arteries patent bilaterally. Anatomic variants: None significant.  No aneurysm. IMPRESSION: MRI HEAD IMPRESSION: 1. No acute intracranial abnormality. 2. Diffuse ventricular prominence out of proportion to cortical sulcation. While this finding may in part be related to underlying cerebral atrophy, a component of normal pressure hydrocephalus could be considered in  the correct clinical setting. 3. Moderately advanced chronic microvascular ischemic disease. MRA HEAD IMPRESSION: Negative intracranial MRA. No large vessel occlusion, hemodynamically significant stenosis, or other acute vascular abnormality. Electronically Signed   By: Rise Mu M.D.   On: 06/10/2022 23:03   MR ANGIO HEAD WO CONTRAST  Result Date: 06/10/2022 CLINICAL DATA:  Initial evaluation for neuro deficit, stroke suspected. EXAM: MRI HEAD WITHOUT CONTRAST MRA HEAD WITHOUT CONTRAST TECHNIQUE: Multiplanar, multi-echo pulse sequences of the brain and surrounding structures were acquired without intravenous contrast. Angiographic images of the Circle of Willis were acquired using MRA technique without intravenous contrast. COMPARISON:  CT from earlier the same day. FINDINGS: MRI HEAD FINDINGS Brain: Cerebral volume within normal limits for age. Scattered patchy T2/FLAIR hyperintensity involving the  periventricular deep white matter both cerebral hemispheres, most consistent with chronic small vessel ischemic disease, moderately advanced in nature. No evidence for acute or subacute ischemia. Gray-white matter differentiation maintained. No areas of chronic cortical infarction. No acute or chronic intracranial blood products. No mass lesion or midline shift. Diffuse ventricular prominence out of proportion to cortical sulcation noted. No extra-axial fluid collection. Pituitary gland and suprasellar region within normal limits. Vascular: Major intracranial vascular flow voids are maintained. Skull and upper cervical spine: Craniocervical junction normal. Bone marrow signal intensity within normal limits. No scalp soft tissue abnormality. Sinuses/Orbits: Globes and orbital soft tissues within normal limits. Paranasal sinuses and mastoid air cells are clear. Other: None. MRA HEAD FINDINGS Anterior circulation: Both internal carotid arteries patent to the termini without stenosis. A1 segments patent bilaterally. Normal anterior communicating artery complex. Anterior cerebral arteries widely patent. No M1 stenosis or occlusion. No proximal MCA branch occlusion. Distal MCA branches perfused and symmetric. Posterior circulation: Both V4 segments patent without stenosis. Left PICA patent. Right PICA not well seen. Basilar patent to its distal aspect without stenosis. Superior cerebellar and posterior cerebral arteries patent bilaterally. Anatomic variants: None significant.  No aneurysm. IMPRESSION: MRI HEAD IMPRESSION: 1. No acute intracranial abnormality. 2. Diffuse ventricular prominence out of proportion to cortical sulcation. While this finding may in part be related to underlying cerebral atrophy, a component of normal pressure hydrocephalus could be considered in the correct clinical setting. 3. Moderately advanced chronic microvascular ischemic disease. MRA HEAD IMPRESSION: Negative intracranial MRA. No large  vessel occlusion, hemodynamically significant stenosis, or other acute vascular abnormality. Electronically Signed   By: Rise Mu M.D.   On: 06/10/2022 23:03   CT Angio Chest PE W/Cm &/Or Wo Cm  Result Date: 06/10/2022 CLINICAL DATA:  History of the LS. Bed-bound. New oxygen requirement. Concern pulmonary embolism. EXAM: CT ANGIOGRAPHY CHEST WITH CONTRAST TECHNIQUE: Multidetector CT imaging of the chest was performed using the standard protocol during bolus administration of intravenous contrast. Multiplanar CT image reconstructions and MIPs were obtained to evaluate the vascular anatomy. RADIATION DOSE REDUCTION: This exam was performed according to the departmental dose-optimization program which includes automated exposure control, adjustment of the mA and/or kV according to patient size and/or use of iterative reconstruction technique. CONTRAST:  64mL OMNIPAQUE IOHEXOL 350 MG/ML SOLN COMPARISON:  None Available. FINDINGS: Cardiovascular: No filling defects within the pulmonary arteries to suggest acute pulmonary embolism. Mediastinum/Nodes: No axillary or supraclavicular adenopathy. No mediastinal or hilar adenopathy. No pericardial fluid. Esophagus normal. Lungs/Pleura: Moderate bibasilar atelectasis. No pulmonary infarction. No airspace disease. No pneumothorax. 3 mm nodule in the LEFT upper lobe (image 88/6 Upper Abdomen: Limited view of the liver, kidneys, pancreas are unremarkable. Normal adrenal glands. Musculoskeletal: No aggressive osseous  lesion. Review of the MIP images confirms the above findings. IMPRESSION: 1. No evidence acute pulmonary embolism. 2. No pulmonary infarction.  Moderate bibasilar atelectasis. 3. 3 mm left solid pulmonary nodule within the upper lobe. Per Fleischner Society Guidelines, if patient is low risk for malignancy, no routine follow-up imaging is recommended; if patient is high risk for malignancy, a non-contrast Chest CT at 12 months is optional. If performed  and the nodule is stable at 12 months, no further follow-up is recommended. These guidelines do not apply to immunocompromised patients and patients with cancer. Follow up in patients with significant comorbidities as clinically warranted. For lung cancer screening, adhere to Lung-RADS guidelines. Reference: Radiology. 2017; 284(1):228-43. Electronically Signed   By: Genevive Bi M.D.   On: 06/10/2022 17:59   DG Chest Port 1 View  Result Date: 06/10/2022 CLINICAL DATA:  Cough and altered mental status EXAM: PORTABLE CHEST 1 VIEW COMPARISON:  04/02/2021, 11/01/2015 FINDINGS: Partially visualized hardware in the left humerus. Low lying right humeral head. Low lung volumes with subsegmental atelectasis or scarring at the left base. Increased patchy airspace disease. Right lung is clear. Enlarged cardiomediastinal silhouette. IMPRESSION: 1. Low lung volumes. Subsegmental atelectasis or scar at the left base. Slight increased left basilar opacity could be due to atelectasis or pneumonia 2. Incompletely visualized right shoulder shows low lying right humeral head, correlate with physical examination with follow-up dedicated right shoulder radiographs if indicated Electronically Signed   By: Jasmine Pang M.D.   On: 06/10/2022 15:40   CT HEAD WO CONTRAST  Result Date: 06/10/2022 CLINICAL DATA:  Mental status change, unknown cause EXAM: CT HEAD WITHOUT CONTRAST TECHNIQUE: Contiguous axial images were obtained from the base of the skull through the vertex without intravenous contrast. RADIATION DOSE REDUCTION: This exam was performed according to the departmental dose-optimization program which includes automated exposure control, adjustment of the mA and/or kV according to patient size and/or use of iterative reconstruction technique. COMPARISON:  Apr 18, 2019. FINDINGS: Brain: Similar ventriculomegaly out of proportion to the degree of sulcal enlargement. Additionally the corpus callosum angle is acute and  there is chronic sulci at the vertex. No evidence of acute large vascular territory infarct, mass lesion, midline shift or extra-axial fluid collection. Mild patchy white matter hypodensities, nonspecific but compatible with chronic microvascular ischemic disease. Vascular: No hyperdense vessel identified. Skull: No acute fracture. Sinuses/Orbits: Clear visualized sinuses. No acute orbital findings. Other: No mastoid effusions. IMPRESSION: 1. 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 2. Otherwise, no evidence of acute abnormality. Electronically Signed   By: Feliberto Harts M.D.   On: 06/10/2022 13:23   PROCEDURES: Critical Care performed: No Procedures MEDICATIONS ORDERED IN ED: Medications  potassium chloride 10 mEq in 100 mL IVPB (10 mEq Intravenous Not Given 06/10/22 1811)  potassium chloride 10 mEq in 100 mL IVPB (0 mEq Intravenous Stopped 06/11/22 0001)  atorvastatin (LIPITOR) tablet 20 mg (20 mg Oral Given 06/10/22 2339)  divalproex (DEPAKOTE) DR tablet 250 mg (250 mg Oral Given 06/10/22 2136)  latanoprost (XALATAN) 0.005 % ophthalmic solution 1 drop (1 drop Both Eyes Given 06/10/22 2340)   stroke: early stages of recovery book ( Does not apply Not Given 06/11/22 0016)  0.9 %  sodium chloride infusion ( Intravenous New Bag/Given 06/11/22 0002)  acetaminophen (TYLENOL) tablet 650 mg (has no administration in time range)    Or  acetaminophen (TYLENOL) 160 MG/5ML solution 650 mg (has no administration  in time range)    Or  acetaminophen (TYLENOL) suppository 650 mg (has no administration in time range)  insulin aspart (novoLOG) injection 0-15 Units (has no administration in time range)  heparin injection 5,000 Units (5,000 Units Subcutaneous Given 06/10/22 2339)  iohexol (OMNIPAQUE) 350 MG/ML injection 80 mL (75 mLs Intravenous Contrast Given 06/10/22 1721)  sodium chloride 0.9 % bolus 1,000 mL  (1,000 mLs Intravenous New Bag/Given 06/10/22 1809)  magnesium sulfate IVPB 2 g 50 mL (0 g Intravenous Stopped 06/10/22 2221)   IMPRESSION / MDM / ASSESSMENT AND PLAN / ED COURSE  I reviewed the triage vital signs and the nursing notes.                             The patient is on the cardiac monitor to evaluate for evidence of arrhythmia and/or significant heart rate changes. Patient's presentation is most consistent with acute presentation with potential threat to life or bodily function. Patient presents for altered mental status of unknown origin  Will obtain medical workup and discuss with social work to try to obtain collateral information.  Given History, Physical, and Workup there is no overt concern for a dangerous emergent cause such as, but not limited to, CNS infection, severe Toxidrome, severe metabolic derangement, or stroke.  Disposition: Admit; the patient is suffering altered mental status that is persistent and therefore they will be admitted.   FINAL CLINICAL IMPRESSION(S) / ED DIAGNOSES   Final diagnoses:  Altered mental status, unspecified altered mental status type  Dehydration  Hypokalemia   Rx / DC Orders   ED Discharge Orders     None      Note:  This document was prepared using Dragon voice recognition software and may include unintentional dictation errors.   Merwyn Katos, MD 06/11/22 [phone]

## 2022-06-10 NOTE — Assessment & Plan Note (Signed)
We will continue levothyroxine. TFT.

## 2022-06-10 NOTE — Assessment & Plan Note (Signed)
Patient assessed by speech therapy and placed on dysphagia 3 diet. -Follow.

## 2022-06-10 NOTE — ED Notes (Signed)
Received report from [place] RN.

## 2022-06-10 NOTE — H&P (Signed)
History and Physical    Cynthia Landry [id number]:[id number] DOB: 05/20/1956 DOA: 06/10/2022  PCP: Almetta Lovely, [facility]    Patient coming from:  [place].    Chief Complaint:  AMS.   HPI:  Cynthia Landry is a 66 y.o. female seen in ed with complaints of AMS from [facility] yesterday.  Yesterday at 3 pm when family saw patient speech was slurred with repeating same phrase and coughing While eating. And EMS was called today. This patient is currently enrolled in [facility] outpatient-based Palliative Care.    Pt has past medical history of allergy to aspirin /HCTZ and lisinopril.  Pt has pmh of ALS, congestive heart failure, depression, diabetes mellitus type 2, hypothyroidism, GERD, hepatitis C, hypertension, peripheral vascular disease.      ED Course:   Vitals:   06/10/22 1158 06/10/22 1159 06/10/22 1630  BP: 136/60  129/62  Pulse: 69  70  Resp: 18  11  Temp: 97.9 F (36.6 C)  99 F (37.2 C)  TempSrc: Axillary  Axillary  SpO2: 98%  99%  Weight:  88 kg   Height:  5' (1.524 m)   In ed pt is alert/awake nonverbal and afebrile. Labs today shows hypokalemia of 2.2 AG of 16 and WBC of 12 and Head ct negative for any acute findings. CTA chest shows: IMPRESSION: 1. No evidence acute pulmonary embolism. 2. No pulmonary infarction.  Moderate bibasilar atelectasis. 3. 3 mm left solid pulmonary nodule within the upper lobe.  Per By: Genevive Bi M.D.   On: 06/10/2022 17:59   Review of Systems:  Review of Systems  Unable to perform ROS: Mental status change (pt nopnverbal. and disoriented.)      Past Medical History:  Diagnosis Date   ALS (amyotrophic lateral sclerosis) (HCC)    Amyotrophic lateral sclerosis (HCC)    Anxiety    Ataxia    CHF (congestive heart failure) (HCC)    Chronic pain    Depression    Diabetes mellitus without complication (HCC)    GERD (gastroesophageal reflux disease)    Hepatitis C    Hyperlipidemia     Hypertension    Psychosis (HCC)    PVD (peripheral vascular disease) (HCC)    Thyroid disease    Vitamin D deficiency     Past Surgical History:  Procedure Laterality Date   COLONOSCOPY     ESOPHAGOGASTRODUODENOSCOPY (EGD) WITH PROPOFOL N/A 12/30/2017   Procedure: ESOPHAGOGASTRODUODENOSCOPY (EGD) WITH PROPOFOL;  Surgeon: Christena Deem, MD;  Location: [facility] ENDOSCOPY;  Service: Endoscopy;  Laterality: N/A;   SHOULDER SURGERY       reports that she has never smoked. She has never used smokeless tobacco. She reports that she does not drink alcohol and does not use drugs.  Allergies  Allergen Reactions   Aspirin Other (See Comments)    dizziness   Hctz [Hydrochlorothiazide] Other (See Comments)    cramping   Lisinopril Cough    Family History  Problem Relation Age of Onset   CAD Father    Diabetes Mellitus II Mother    Stroke Mother     Prior to Admission medications   Medication Sig Start Date End Date Taking? Authorizing Provider  atorvastatin (LIPITOR) 20 MG tablet Take 20 tablets by mouth at bedtime. 03/24/21  Yes [provider]  Baclofen 5 MG TABS Take 1 tablet by mouth 3 (three) times daily. 0900/1400/2100 05/24/22  Yes [provider]  bisacodyl (DULCOLAX) 10 MG suppository Place 1 suppository rectally  at bedtime.   Yes [provider]  carvedilol (COREG) 12.5 MG tablet Take 1 tablet (12.5 mg total) by mouth 2 (two) times daily. 02/05/22 06/10/22 Yes Hackney, Inetta Fermo A, FNP  divalproex (DEPAKOTE) 250 MG DR tablet Take 250 mg by mouth daily. 03/25/21  Yes [provider]  DULoxetine (CYMBALTA) 30 MG capsule Take 60 mg by mouth every morning.   Yes [provider]  furosemide (LASIX) 40 MG tablet Take 40 mg by mouth daily. And additional  QPM PRN for edema or shortness of breath   Yes [provider]  gabapentin (NEURONTIN) 300 MG capsule Take 300 mg by mouth at bedtime.   Yes [provider]  insulin glargine  (LANTUS) 100 UNIT/ML injection Inject 10 Units into the skin at bedtime.   Yes [provider]  insulin lispro (HUMALOG) 100 UNIT/ML injection Inject 5 Units into the skin 3 (three) times daily before meals. 0800/1300/1700   Yes [provider]  latanoprost (XALATAN) 0.005 % ophthalmic solution Place 1 drop into both eyes at bedtime. 02/17/21  Yes [provider]  levothyroxine (SYNTHROID) 150 MCG tablet Take 150 mcg by mouth daily. 03/15/21  Yes [provider]  melatonin 5 MG TABS Take 10 mg by mouth at bedtime.   Yes [provider]  metFORMIN (GLUCOPHAGE) 500 MG tablet Take 500 mg by mouth daily.   Yes [provider]  polyethylene glycol powder (GLYCOLAX/MIRALAX) 17 GM/SCOOP powder Take 17 g by mouth daily.   Yes [provider]  sacubitril-valsartan (ENTRESTO) 24-26 MG Take 1 tablet by mouth 2 (two) times daily. 09/07/21  Yes Hackney, Tina A, FNP  senna (SENOKOT) 8.6 MG TABS tablet Take 2 tablets by mouth 2 (two) times daily. 05/22/22  Yes [provider]  sertraline (ZOLOFT) 25 MG tablet Take 25 mg by mouth daily.   Yes [provider]  vitamin B-12 (CYANOCOBALAMIN) 500 MCG tablet Take 1 tablet by mouth daily with breakfast.   Yes [provider]  Zinc Oxide 10 % OINT Apply 1 Application topically in the morning, at noon, and at bedtime. To groin area   Yes [provider]  acetaminophen (TYLENOL) 325 MG tablet Take 650 mg by mouth every 4 (four) hours as needed. 12/26/21   [provider]  cyclobenzaprine (FLEXERIL) 5 MG tablet Take 5 mg by mouth 3 (three) times daily. Patient not taking: Reported on 06/10/2022    [provider]  Docusate Sodium 100 MG capsule Take 100 mg by mouth every 12 (twelve) hours as needed for constipation. Patient not taking: Reported on 06/10/2022    [provider]  HYDROcodone-acetaminophen (NORCO/VICODIN) 5-325 MG tablet Take 1-2 tablets by mouth  every 4 (four) hours as needed for moderate pain or severe pain. Patient not taking: Reported on 06/10/2022 04/04/21   Arnetha Courser, MD  Ipratropium-Albuterol (COMBIVENT) 20-100 MCG/ACT AERS respimat Inhale 1 puff into the lungs every 6 (six) hours. Patient not taking: Reported on 06/10/2022    [provider]  metoCLOPramide (REGLAN) 5 MG tablet Take 5 mg by mouth 4 (four) times daily. Patient not taking: Reported on 06/10/2022    [provider]  Multiple Vitamin (MULTIVITAMIN) tablet Take 1 tablet by mouth daily. Patient not taking: Reported on 06/10/2022    [provider]  nystatin cream (MYCOSTATIN) Apply topically. Patient not taking: Reported on 06/10/2022 03/19/21   [provider]    Physical Exam: Vitals:   06/10/22 1158 06/10/22 1159 06/10/22 1630  BP: 136/60  129/62  Pulse: 69  70  Resp: 18  11  Temp: 97.9 F (36.6 C)  99 F (37.2 C)  TempSrc: Axillary  Axillary  SpO2: 98%  99%  Weight:  88 kg   Height:  5' (1.524 m)    Physical Exam Vitals reviewed.  Constitutional:      General: She is not in acute distress. HENT:     Head: Normocephalic and atraumatic.     Right Ear: External ear normal.     Left Ear: External ear normal.  Eyes:     Extraocular Movements: Extraocular movements intact.     Pupils: Pupils are equal, round, and reactive to light.  Cardiovascular:     Rate and Rhythm: Normal rate and regular rhythm.     Pulses: Normal pulses.     Heart sounds: Normal heart sounds.  Pulmonary:     Effort: Pulmonary effort is normal.     Breath sounds: Normal breath sounds.  Abdominal:     General: Bowel sounds are normal. There is no distension.     Palpations: Abdomen is soft. There is no mass.     Tenderness: There is no abdominal tenderness. There is no guarding.     Hernia: No hernia is present.  Musculoskeletal:     Right lower leg: No edema.     Left lower leg: No edema.  Skin:    General: Skin is warm.  Neurological:      Deep Tendon Reflexes: Reflexes abnormal.      Labs on Admission: I have personally reviewed following labs and imaging studies BMET Recent Labs  Lab 06/10/22 1245  NA 142  K 2.2*  CL 84*  CO2 42*  BUN 13  CREATININE 0.62  GLUCOSE 205*   Electrolytes Recent Labs  Lab 06/10/22 1245  CALCIUM 8.8*  MG 1.7  1.9   Sepsis Markers Recent Labs  Lab 06/10/22 1943  LATICACIDVEN 1.1   ABG No results for input(s): "PHART", "PCO2ART", "PO2ART" in the last 168 hours. Liver Enzymes Recent Labs  Lab 06/10/22 1245  AST 35  ALT 21  ALKPHOS 51  BILITOT 1.3*  ALBUMIN 3.3*   Cardiac Enzymes No results for input(s): "TROPONINI", "PROBNP" in the last 168 hours. No results found for: "DDIMER" Coag's Recent Labs  Lab 06/10/22 1245  APTT 28  INR 1.1    No results found for this or any previous visit (from the past 240 hour(s)).   Current Facility-Administered Medications:     stroke: early stages of recovery book, , Does not apply, Once, Gertha Calkin, MD   0.9 %  sodium chloride infusion, , Intravenous, Continuous, Allena Katz, Eliezer Mccoy, MD   acetaminophen (TYLENOL) tablet 650 mg, 650 mg, Oral, Q4H PRN **OR** acetaminophen (TYLENOL) 160 MG/5ML solution 650 mg, 650 mg, Per Tube, Q4H PRN **OR** acetaminophen (TYLENOL) suppository 650 mg, 650 mg, Rectal, Q4H PRN, Gertha Calkin, MD   atorvastatin (LIPITOR) tablet 20 mg, 20 mg, Oral, QHS, Carsen Machi V, MD   divalproex (DEPAKOTE) DR tablet 250 mg, 250 mg, Oral, Daily, Cj Beecher V, MD   heparin injection 5,000 Units, 5,000 Units, Subcutaneous, Q8H, Gertha Calkin, MD   [START ON 06/11/2022] insulin aspart (novoLOG) injection 0-15 Units, 0-15 Units, Subcutaneous, TID WC, Kiyona Mcnall V, MD   latanoprost (XALATAN) 0.005 % ophthalmic solution 1 drop, 1 drop, Both Eyes, QHS, Ginnie Marich V, MD   magnesium sulfate IVPB 2 g 50 mL, 2 g, Intravenous, Once,  Gertha Calkin, MD, Last Rate: 50 mL/hr at 06/10/22 2012, 2 g at 06/10/22 2012   potassium  chloride 10 mEq in 100 mL IVPB, 10 mEq, Intravenous, Q1 Hr x 3, Irena Cords V, MD, Last Rate: 100 mL/hr at 06/10/22 2007, 10 mEq at 06/10/22 2007  Current Outpatient Medications:    atorvastatin (LIPITOR) 20 MG tablet, Take 20 mg by mouth at bedtime., Disp: , Rfl:    Baclofen 5 MG TABS, Take 1 tablet by mouth 3 (three) times daily. 0900/1400/2100, Disp: , Rfl:    bisacodyl (DULCOLAX) 10 MG suppository, Place 1 suppository rectally at bedtime., Disp: , Rfl:    carvedilol (COREG) 12.5 MG tablet, Take 1 tablet (12.5 mg total) by mouth 2 (two) times daily., Disp: 180 tablet, Rfl: 3   divalproex (DEPAKOTE) 250 MG DR tablet, Take 250 mg by mouth daily., Disp: , Rfl:    DULoxetine (CYMBALTA) 30 MG capsule, Take 60 mg by mouth every morning., Disp: , Rfl:    furosemide (LASIX) 40 MG tablet, Take 40 mg by mouth daily. And additional 40mg  QPM PRN for edema or shortness of breath, Disp: , Rfl:    gabapentin (NEURONTIN) 300 MG capsule, Take 300 mg by mouth at bedtime., Disp: , Rfl:    insulin glargine (LANTUS) 100 UNIT/ML injection, Inject 10 Units into the skin at bedtime., Disp: , Rfl:    insulin lispro (HUMALOG) 100 UNIT/ML injection, Inject 5 Units into the skin 3 (three) times daily before meals. 0800/1300/1700, Disp: , Rfl:    latanoprost (XALATAN) 0.005 % ophthalmic solution, Place 1 drop into both eyes at bedtime., Disp: , Rfl:    levothyroxine (SYNTHROID) 150 MCG tablet, Take 150 mcg by mouth daily., Disp: , Rfl:    melatonin 5 MG TABS, Take 10 mg by mouth at bedtime., Disp: , Rfl:    metFORMIN (GLUCOPHAGE) 500 MG tablet, Take 500 mg by mouth daily., Disp: , Rfl:    polyethylene glycol powder (GLYCOLAX/MIRALAX) 17 GM/SCOOP powder, Take 17 g by mouth daily., Disp: , Rfl:    sacubitril-valsartan (ENTRESTO) 24-26 MG, Take 1 tablet by mouth 2 (two) times daily., Disp: 60 tablet, Rfl: 3   senna (SENOKOT) 8.6 MG TABS tablet, Take 2 tablets by mouth 2 (two) times daily., Disp: , Rfl:    sertraline (ZOLOFT)  25 MG tablet, Take 25 mg by mouth daily., Disp: , Rfl:    vitamin B-12 (CYANOCOBALAMIN) 500 MCG tablet, Take 1 tablet by mouth daily with breakfast., Disp: , Rfl:    Zinc Oxide 10 % OINT, Apply 1 Application topically in the morning, at noon, and at bedtime. To groin area, Disp: , Rfl:    acetaminophen (TYLENOL) 325 MG tablet, Take 650 mg by mouth every 4 (four) hours as needed., Disp: , Rfl:    cyclobenzaprine (FLEXERIL) 5 MG tablet, Take 5 mg by mouth 3 (three) times daily. (Patient not taking: Reported on 06/10/2022), Disp: , Rfl:    Docusate Sodium 100 MG capsule, Take 100 mg by mouth every 12 (twelve) hours as needed for constipation. (Patient not taking: Reported on 06/10/2022), Disp: , Rfl:    HYDROcodone-acetaminophen (NORCO/VICODIN) 5-325 MG tablet, Take 1-2 tablets by mouth every 4 (four) hours as needed for moderate pain or severe pain. (Patient not taking: Reported on 06/10/2022), Disp: 30 tablet, Rfl: 0   Ipratropium-Albuterol (COMBIVENT) 20-100 MCG/ACT AERS respimat, Inhale 1 puff into the lungs every 6 (six) hours. (Patient not taking: Reported on 06/10/2022), Disp: , Rfl:    metoCLOPramide (  REGLAN) 5 MG tablet, Take 5 mg by mouth 4 (four) times daily. (Patient not taking: Reported on 06/10/2022), Disp: , Rfl:    Multiple Vitamin (MULTIVITAMIN) tablet, Take 1 tablet by mouth daily. (Patient not taking: Reported on 06/10/2022), Disp: , Rfl:    nystatin cream (MYCOSTATIN), Apply topically. (Patient not taking: Reported on 06/10/2022), Disp: , Rfl:   COVID-19 Labs No results for input(s): "DDIMER", "FERRITIN", "LDH", "CRP" in the last 72 hours. Lab Results  Component Value Date   SARSCOV2NAA NEGATIVE 04/04/2021   SARSCOV2NAA NEGATIVE 04/02/2021    Radiological Exams on Admission: CT Angio Chest PE W/Cm &/Or Wo Cm  Result Date: 06/10/2022 CLINICAL DATA:  History of the LS. Bed-bound. New oxygen requirement. Concern pulmonary embolism. EXAM: CT ANGIOGRAPHY CHEST WITH CONTRAST TECHNIQUE:  Multidetector CT imaging of the chest was performed using the standard protocol during bolus administration of intravenous contrast. Multiplanar CT image reconstructions and MIPs were obtained to evaluate the vascular anatomy. RADIATION DOSE REDUCTION: This exam was performed according to the departmental dose-optimization program which includes automated exposure control, adjustment of the mA and/or kV according to patient size and/or use of iterative reconstruction technique. CONTRAST:  62mL OMNIPAQUE IOHEXOL 350 MG/ML SOLN COMPARISON:  None Available. FINDINGS: Cardiovascular: No filling defects within the pulmonary arteries to suggest acute pulmonary embolism. Mediastinum/Nodes: No axillary or supraclavicular adenopathy. No mediastinal or hilar adenopathy. No pericardial fluid. Esophagus normal. Lungs/Pleura: Moderate bibasilar atelectasis. No pulmonary infarction. No airspace disease. No pneumothorax. 3 mm nodule in the LEFT upper lobe (image 88/6 Upper Abdomen: Limited view of the liver, kidneys, pancreas are unremarkable. Normal adrenal glands. Musculoskeletal: No aggressive osseous lesion. Review of the MIP images confirms the above findings. IMPRESSION: 1. No evidence acute pulmonary embolism. 2. No pulmonary infarction.  Moderate bibasilar atelectasis. 3. 3 mm left solid pulmonary nodule within the upper lobe. Per Fleischner Society Guidelines, if patient is low risk for malignancy, no routine follow-up imaging is recommended; if patient is high risk for malignancy, a non-contrast Chest CT at 12 months is optional. If performed and the nodule is stable at 12 months, no further follow-up is recommended. These guidelines do not apply to immunocompromised patients and patients with cancer. Follow up in patients with significant comorbidities as clinically warranted. For lung cancer screening, adhere to Lung-RADS guidelines. Reference: Radiology. 2017; 284(1):228-43. Electronically Signed   By: Genevive Bi M.D.   On: 06/10/2022 17:59   DG Chest Port 1 View  Result Date: 06/10/2022 CLINICAL DATA:  Cough and altered mental status EXAM: PORTABLE CHEST 1 VIEW COMPARISON:  04/02/2021, 11/01/2015 FINDINGS: Partially visualized hardware in the left humerus. Low lying right humeral head. Low lung volumes with subsegmental atelectasis or scarring at the left base. Increased patchy airspace disease. Right lung is clear. Enlarged cardiomediastinal silhouette. IMPRESSION: 1. Low lung volumes. Subsegmental atelectasis or scar at the left base. Slight increased left basilar opacity could be due to atelectasis or pneumonia 2. Incompletely visualized right shoulder shows low lying right humeral head, correlate with physical examination with follow-up dedicated right shoulder radiographs if indicated Electronically Signed   By: Jasmine Pang M.D.   On: 06/10/2022 15:40   CT HEAD WO CONTRAST  Result Date: 06/10/2022 CLINICAL DATA:  Mental status change, unknown cause EXAM: CT HEAD WITHOUT CONTRAST TECHNIQUE: Contiguous axial images were obtained from the base of the skull through the vertex without intravenous contrast. RADIATION DOSE REDUCTION: This exam was performed according to the departmental dose-optimization program which includes automated exposure control,  adjustment of the mA and/or kV according to patient size and/or use of iterative reconstruction technique. COMPARISON:  Apr 18, 2019. FINDINGS: Brain: Similar ventriculomegaly out of proportion to the degree of sulcal enlargement. Additionally the corpus callosum angle is acute and there is chronic sulci at the vertex. No evidence of acute large vascular territory infarct, mass lesion, midline shift or extra-axial fluid collection. Mild patchy white matter hypodensities, nonspecific but compatible with chronic microvascular ischemic disease. Vascular: No hyperdense vessel identified. Skull: No acute fracture. Sinuses/Orbits: Clear visualized sinuses. No  acute orbital findings. Other: No mastoid effusions. IMPRESSION: 1. 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 2. Otherwise, no evidence of acute abnormality. Electronically Signed   By: Feliberto Harts M.D.   On: 06/10/2022 13:23    EKG: Independently reviewed.  SR at 70 with prolonged qtc and LVH.    Assessment and Plan: * AMS (altered mental status) D/d include TIA/CVA/ Infection/ Seizures. We will obtain MRI and EEG. Blood/ Urine cultures.  Troponin. VTE eval negative so far.    GERD (gastroesophageal reflux disease) IV PPI.  CHF (congestive heart failure) (HCC) Stable and compensated. No edema JVD or 3rd heart sound. Hold diuresis  And daily weight. Strict I/O.  COPD (chronic obstructive pulmonary disease) (HCC) Stable no wheezing. SpO2: 99 % O2 Flow Rate (L/min): 2 L/min Currently on 2 L [place].    Diabetes mellitus without complication (HCC) Hold home Lantus to prevent any hypoglycemia as pt is NPO> Cover with SSI and glycemic protocol.   Leukocytosis ? Etiology. Daughter does not know if pt has any sacral decubs .  says mom does have frequent UTI. Skin care.   Hypertension, benign Blood pressure 129/62, pulse 70, temperature 99 F (37.2 C), temperature source Axillary, resp. rate 11, height 5' (1.524 m), weight 88 kg, SpO2 99 %.  Vitals:   06/10/22 1158 06/10/22 1630  BP: 136/60 129/62  BP is low normal we will hold Coreg, lasix and entresto.      Hypothyroidism We will continue levothyroxine. TFT.   Depression D/W daughter that we may consider an NGT for meds.  If pt has had a cva or suspected I will need to give her plavix if MRI is possible.   Dysphagia NPO. Bedside swallow.   Metabolic acidosis Elevated Ag. Lactic level pending.  Kidney function normal.  We will check for beta hydroxy butyrate   DVT prophylaxis:  Heparin    Code  Status:  DNR   Family Communication:  Winfred Burn (Daughter)  [phone] (Home Phone)   Disposition Plan:  [facility].    Consults called:  None   Admission status: Observation.    Gertha Calkin MD [facility]  6 PM- 2 AM. Please contact me via secure Chat 6 PM-2 AM. [phone] ( Pager ) To contact the [facility] Attending or Consulting provider 7A - 7P or covering provider during after hours 7P -7A, for this patient.   Check the care team in [facility] and look for a) attending/consulting [facility] provider listed and b) the [facility] team listed Log into [email or web address] and use [place]'s universal password to access. If you do not have the password, please contact the hospital operator. Locate the [facility] provider you are looking for under [facility] and page to a number that you can be directly reached. If you still have difficulty reaching the provider, please page the [facility] (Director on Call) for the Hospitalists  listed on amion for assistance. [email or web address] 06/10/2022, 8:35 PM

## 2022-06-10 NOTE — Assessment & Plan Note (Signed)
Hold home Lantus to prevent any hypoglycemia as pt is NPO> Cover with SSI and glycemic protocol.

## 2022-06-10 NOTE — Assessment & Plan Note (Signed)
D/W daughter that we may consider an NGT for meds.  If pt has had a cva or suspected I will need to give her plavix if MRI is possible.

## 2022-06-10 NOTE — Assessment & Plan Note (Signed)
?   Etiology. Daughter does not know if pt has any sacral decubs .  says mom does have frequent UTI. Skin care.

## 2022-06-10 NOTE — Assessment & Plan Note (Signed)
IV PPI. 

## 2022-06-11 ENCOUNTER — Inpatient Hospital Stay (HOSPITAL_COMMUNITY)
Admit: 2022-06-11 | Discharge: 2022-06-11 | Disposition: A | Discharge status: Home / Self Care | Payer: Medicare Other | Attending: Internal Medicine | Admitting: Internal Medicine

## 2022-06-11 DIAGNOSIS — I10 Essential (primary) hypertension: Secondary | ICD-10-CM | POA: Diagnosis not present

## 2022-06-11 DIAGNOSIS — J449 Chronic obstructive pulmonary disease, unspecified: Secondary | ICD-10-CM | POA: Diagnosis not present

## 2022-06-11 DIAGNOSIS — E119 Type 2 diabetes mellitus without complications: Secondary | ICD-10-CM

## 2022-06-11 DIAGNOSIS — F329 Major depressive disorder, single episode, unspecified: Secondary | ICD-10-CM

## 2022-06-11 DIAGNOSIS — R404 Transient alteration of awareness: Secondary | ICD-10-CM | POA: Diagnosis not present

## 2022-06-11 DIAGNOSIS — R4182 Altered mental status, unspecified: Secondary | ICD-10-CM

## 2022-06-11 DIAGNOSIS — E860 Dehydration: Secondary | ICD-10-CM | POA: Diagnosis not present

## 2022-06-11 DIAGNOSIS — I509 Heart failure, unspecified: Secondary | ICD-10-CM

## 2022-06-11 DIAGNOSIS — E872 Acidosis, unspecified: Secondary | ICD-10-CM

## 2022-06-11 DIAGNOSIS — K219 Gastro-esophageal reflux disease without esophagitis: Secondary | ICD-10-CM

## 2022-06-11 DIAGNOSIS — R1310 Dysphagia, unspecified: Secondary | ICD-10-CM

## 2022-06-11 DIAGNOSIS — E876 Hypokalemia: Secondary | ICD-10-CM | POA: Diagnosis present

## 2022-06-11 DIAGNOSIS — D72829 Elevated white blood cell count, unspecified: Secondary | ICD-10-CM

## 2022-06-11 DIAGNOSIS — N390 Urinary tract infection, site not specified: Secondary | ICD-10-CM | POA: Diagnosis present

## 2022-06-11 DIAGNOSIS — E039 Hypothyroidism, unspecified: Secondary | ICD-10-CM

## 2022-06-11 LAB — CBC WITH DIFFERENTIAL/PLATELET
Abs Immature Granulocytes: 0.03 10*3/uL (ref 0.00–0.07)
Basophils Absolute: 0.1 10*3/uL (ref 0.0–0.1)
Basophils Relative: 1 %
Eosinophils Absolute: 0.3 10*3/uL (ref 0.0–0.5)
Eosinophils Relative: 3 %
HCT: 46.6 % — ABNORMAL HIGH (ref 36.0–46.0)
Hemoglobin: 14 g/dL (ref 12.0–15.0)
Immature Granulocytes: 0 %
Lymphocytes Relative: 42 %
Lymphs Abs: 3.8 10*3/uL (ref 0.7–4.0)
MCH: 27.6 pg (ref 26.0–34.0)
MCHC: 30 g/dL (ref 30.0–36.0)
MCV: 91.7 fL (ref 80.0–100.0)
Monocytes Absolute: 0.9 10*3/uL (ref 0.1–1.0)
Monocytes Relative: 10 %
Neutro Abs: 4 10*3/uL (ref 1.7–7.7)
Neutrophils Relative %: 44 %
Platelets: 310 10*3/uL (ref 150–400)
RBC: 5.08 MIL/uL (ref 3.87–5.11)
RDW: 12.7 % (ref 11.5–15.5)
WBC: 9.1 10*3/uL (ref 4.0–10.5)
nRBC: 0 % (ref 0.0–0.2)

## 2022-06-11 LAB — COMPREHENSIVE METABOLIC PANEL
ALT: 20 U/L (ref 0–44)
AST: 34 U/L (ref 15–41)
Albumin: 3 g/dL — ABNORMAL LOW (ref 3.5–5.0)
Alkaline Phosphatase: 46 U/L (ref 38–126)
Anion gap: 13 (ref 5–15)
BUN: 8 mg/dL (ref 8–23)
CO2: 39 mmol/L — ABNORMAL HIGH (ref 22–32)
Calcium: 8.1 mg/dL — ABNORMAL LOW (ref 8.9–10.3)
Chloride: 90 mmol/L — ABNORMAL LOW (ref 98–111)
Creatinine, Ser: 0.42 mg/dL — ABNORMAL LOW (ref 0.44–1.00)
GFR, Estimated: >60 mL/min (ref >60)
Glucose, Bld: 177 mg/dL — ABNORMAL HIGH (ref 70–99)
Potassium: 2.2 mmol/L — CL (ref 3.5–5.1)
Sodium: 142 mmol/L (ref 135–145)
Total Bilirubin: 1.3 mg/dL — ABNORMAL HIGH (ref 0.3–1.2)
Total Protein: 7.3 g/dL (ref 6.5–8.1)

## 2022-06-11 LAB — URINALYSIS, ROUTINE W REFLEX MICROSCOPIC
Bilirubin Urine: NEGATIVE
Glucose, UA: NEGATIVE mg/dL
Hgb urine dipstick: NEGATIVE
Ketones, ur: 5 mg/dL — AB
Leukocytes,Ua: NEGATIVE
Nitrite: POSITIVE — AB
Protein, ur: NEGATIVE mg/dL
Specific Gravity, Urine: 1.038 — ABNORMAL HIGH (ref 1.005–1.030)
pH: 6 (ref 5.0–8.0)

## 2022-06-11 LAB — ECHOCARDIOGRAM COMPLETE BUBBLE STUDY
AV Mean grad: 2 mmHg
AV Peak grad: 3.4 mmHg
Ao pk vel: 0.92 m/s
Area-P 1/2: 2.7 cm2

## 2022-06-11 LAB — URINE DRUG SCREEN, QUALITATIVE (ARMC ONLY)
Amphetamines, Ur Screen: NOT DETECTED
Barbiturates, Ur Screen: NOT DETECTED
Benzodiazepine, Ur Scrn: NOT DETECTED
Cannabinoid 50 Ng, Ur ~~LOC~~: NOT DETECTED
Cocaine Metabolite,Ur ~~LOC~~: NOT DETECTED
MDMA (Ecstasy)Ur Screen: NOT DETECTED
Methadone Scn, Ur: NOT DETECTED
Opiate, Ur Screen: NOT DETECTED
Phencyclidine (PCP) Ur S: NOT DETECTED
Tricyclic, Ur Screen: NOT DETECTED

## 2022-06-11 LAB — CBG MONITORING, ED
Glucose-Capillary: 150 mg/dL — ABNORMAL HIGH (ref 70–99)
Glucose-Capillary: 215 mg/dL — ABNORMAL HIGH (ref 70–99)
Glucose-Capillary: 143 mg/dL — ABNORMAL HIGH (ref 70–99)

## 2022-06-11 LAB — LIPID PANEL
Cholesterol: 153 mg/dL (ref 0–200)
HDL: 25 mg/dL — ABNORMAL LOW (ref >40)
LDL Cholesterol: 100 mg/dL — ABNORMAL HIGH (ref 0–99)
Total CHOL/HDL Ratio: 6.1 RATIO
Triglycerides: 139 mg/dL (ref <150)
VLDL: 28 mg/dL (ref 0–40)

## 2022-06-11 LAB — TROPONIN I (HIGH SENSITIVITY)
Troponin I (High Sensitivity): 45 ng/L — ABNORMAL HIGH (ref <18)
Troponin I (High Sensitivity): 46 ng/L — ABNORMAL HIGH (ref <18)

## 2022-06-11 LAB — MAGNESIUM: Magnesium: 2.4 mg/dL (ref 1.7–2.4)

## 2022-06-11 MED ORDER — SODIUM CHLORIDE 0.9 % IV BOLUS
500.0000 mL | Freq: Once | INTRAVENOUS | Status: AC
Start: 2022-06-11 — End: 2022-06-11
  Administered 2022-06-11: 500 mL via INTRAVENOUS

## 2022-06-11 MED ORDER — SODIUM CHLORIDE 0.9 % IV SOLN
2.0000 g | INTRAVENOUS | Status: DC
  Administered 2022-06-11 – 2022-06-13 (×3): 2 g via INTRAVENOUS
  Filled 2022-06-11 (×3): qty 20

## 2022-06-11 MED ORDER — BISACODYL 10 MG RE SUPP
10.0000 mg | Freq: Every day | RECTAL | Status: DC
Start: 2022-06-11 — End: 2022-06-14
  Administered 2022-06-11: 10 mg via RECTAL
  Filled 2022-06-11: qty 1

## 2022-06-11 MED ORDER — BACLOFEN 10 MG PO TABS
5.0000 mg | ORAL_TABLET | Freq: Three times a day (TID) | ORAL | Status: DC
  Administered 2022-06-11 – 2022-06-14 (×10): 5 mg via ORAL
  Filled 2022-06-11 (×12): qty 0.5

## 2022-06-11 MED ORDER — GABAPENTIN 300 MG PO CAPS
300.0000 mg | ORAL_CAPSULE | Freq: Every day | ORAL | Status: DC
  Administered 2022-06-11 – 2022-06-13 (×3): 300 mg via ORAL
  Filled 2022-06-11 (×3): qty 1

## 2022-06-11 MED ORDER — POTASSIUM CHLORIDE CRYS ER 20 MEQ PO TBCR: 40.0000 meq | EXTENDED_RELEASE_TABLET | ORAL | Status: DC

## 2022-06-11 MED ORDER — DULOXETINE HCL 30 MG PO CPEP
60.0000 mg | ORAL_CAPSULE | Freq: Every morning | ORAL | Status: DC
  Administered 2022-06-11 – 2022-06-14 (×4): 60 mg via ORAL
  Filled 2022-06-11: qty 1
  Filled 2022-06-11 (×2): qty 2
  Filled 2022-06-11: qty 1

## 2022-06-11 MED ORDER — LEVOTHYROXINE SODIUM 50 MCG PO TABS
150.0000 ug | ORAL_TABLET | Freq: Every day | ORAL | Status: DC
  Administered 2022-06-11 – 2022-06-14 (×4): 150 ug via ORAL
  Filled 2022-06-11: qty 1
  Filled 2022-06-11 (×2): qty 3
  Filled 2022-06-11: qty 1

## 2022-06-11 MED ORDER — POTASSIUM CHLORIDE 10 MEQ/100ML IV SOLN
10.0000 meq | INTRAVENOUS | Status: AC
  Administered 2022-06-11 (×3): 10 meq via INTRAVENOUS
  Filled 2022-06-11 (×3): qty 100

## 2022-06-11 MED ORDER — CYANOCOBALAMIN 500 MCG PO TABS
500.0000 ug | ORAL_TABLET | Freq: Every day | ORAL | Status: DC
Start: 2022-06-11 — End: 2022-06-14
  Administered 2022-06-12 – 2022-06-14 (×3): 500 ug via ORAL
  Filled 2022-06-11 (×4): qty 1

## 2022-06-11 MED ORDER — SERTRALINE HCL 50 MG PO TABS
25.0000 mg | ORAL_TABLET | Freq: Every evening | ORAL | Status: DC
  Administered 2022-06-11 – 2022-06-13 (×3): 25 mg via ORAL
  Filled 2022-06-11 (×3): qty 1

## 2022-06-11 MED ORDER — CARVEDILOL 12.5 MG PO TABS
12.5000 mg | ORAL_TABLET | Freq: Two times a day (BID) | ORAL | Status: DC
  Administered 2022-06-11 – 2022-06-14 (×7): 12.5 mg via ORAL
  Filled 2022-06-11 (×2): qty 1
  Filled 2022-06-11 (×2): qty 2
  Filled 2022-06-11 (×2): qty 1
  Filled 2022-06-11 (×2): qty 2

## 2022-06-11 MED ORDER — POLYETHYLENE GLYCOL 3350 17 G PO PACK
17.0000 g | PACK | Freq: Every day | ORAL | Status: DC
  Administered 2022-06-11 – 2022-06-13 (×3): 17 g via ORAL
  Filled 2022-06-11 (×3): qty 1

## 2022-06-11 MED ORDER — PERFLUTREN LIPID MICROSPHERE
1.0000 mL | INTRAVENOUS | Status: AC | PRN
  Administered 2022-06-11: 2 mL via INTRAVENOUS

## 2022-06-11 MED ORDER — PANTOPRAZOLE SODIUM 40 MG IV SOLR
40.0000 mg | INTRAVENOUS | Status: DC
  Administered 2022-06-11 – 2022-06-13 (×3): 40 mg via INTRAVENOUS
  Filled 2022-06-11 (×3): qty 10

## 2022-06-11 MED ORDER — ENOXAPARIN SODIUM 40 MG/0.4ML IJ SOSY
40.0000 mg | PREFILLED_SYRINGE | INTRAMUSCULAR | Status: DC
  Administered 2022-06-12 – 2022-06-14 (×3): 40 mg via SUBCUTANEOUS
  Filled 2022-06-11 (×4): qty 0.4

## 2022-06-11 MED ORDER — SENNA 8.6 MG PO TABS
2.0000 | ORAL_TABLET | Freq: Two times a day (BID) | ORAL | Status: DC
  Administered 2022-06-12 – 2022-06-13 (×2): 17.2 mg via ORAL
  Filled 2022-06-11 (×5): qty 2

## 2022-06-11 MED ORDER — POTASSIUM CHLORIDE CRYS ER 20 MEQ PO TBCR
40.0000 meq | EXTENDED_RELEASE_TABLET | ORAL | Status: AC
  Administered 2022-06-11 (×3): 40 meq via ORAL
  Filled 2022-06-11 (×3): qty 2

## 2022-06-11 NOTE — TOC Initial Note (Signed)
Transition of Care [facility]) - Initial/Assessment Note    Patient Details  Name: Cynthia Landry MRN: [id number] Date of Birth: January 14, 1956  [facility] CM/SW Contact:    Margarito Liner, LCSW Phone Number: 06/11/2022, 3:22 PM  Clinical Narrative:   Per chart review, patient admitted from [facility]. Confirmed with admissions coordinator that she is a long-term resident. CSW will continue to follow patient for support and facilitate return to facility once medically stable.               Expected Discharge Plan: Skilled Nursing Facility Barriers to Discharge: Continued Medical Work up   Patient Goals and CMS Choice     Choice offered to / list presented to : NA  Expected Discharge Plan and Services Expected Discharge Plan: Skilled Nursing Facility     Post Acute Care Choice: Resumption of Svcs/PTA Provider Living arrangements for the past 2 months: Skilled Nursing Facility                                      Prior Living Arrangements/Services Living arrangements for the past 2 months: Skilled Nursing Facility Lives with:: Facility Resident Patient language and need for interpreter reviewed:: Yes        Need for Family Participation in Patient Care: Yes (Comment) Care giver support system in place?: Yes (comment)   Criminal Activity/Legal Involvement Pertinent to Current Situation/Hospitalization: No - Comment as needed  Activities of Daily Living      Permission Sought/Granted Permission sought to share information with : Facility Engineer, maintenance (IT) granted to share info w AGENCY:  Health Care        Emotional Assessment   Attitude/Demeanor/Rapport: Unable to Assess Affect (typically observed): Unable to Assess   Alcohol / Substance Use: Not Applicable Psych Involvement: No (comment)  Admission diagnosis:  AMS (altered mental status) [R41.82] Patient Active Problem List   Diagnosis Date Noted    Hypokalemia 06/11/2022   UTI (urinary tract infection): pROBABLE 06/11/2022   CHF (congestive heart failure) (HCC) 06/10/2022   GERD (gastroesophageal reflux disease) 06/10/2022   AMS (altered mental status) 06/10/2022   Left leg pain    Sinus tachycardia    Intractable pain, left leg 04/02/2021   Leukocytosis 04/02/2021   Diabetes mellitus without complication (HCC) 04/02/2021   COPD (chronic obstructive pulmonary disease) (HCC) 04/02/2021   Debility 02/24/2019   Multifactorial functional impairment 12/30/2018   Palliative care encounter 12/30/2018   Dysphagia 12/30/2018   Metabolic acidosis 11/11/2015   Depression 12/06/2013   ALS (amyotrophic lateral sclerosis) (HCC) 02/25/2013   Chronic hepatitis C (HCC) 06/07/2011   Hypertension, benign 02/27/2011   Hypothyroidism 10/23/2010   PCP:  [facility] Pharmacy:  No Pharmacies Listed    Social Determinants of Health (SDOH) Interventions    Readmission Risk Interventions     No data to display

## 2022-06-11 NOTE — ED Notes (Signed)
EEG tech in with pt and family.

## 2022-06-11 NOTE — ED Notes (Signed)
EEG at bedside.

## 2022-06-11 NOTE — Progress Notes (Signed)
Eeg done 

## 2022-06-11 NOTE — Hospital Course (Signed)
HPI per Dr. Elba Barman is a 66 y.o. female seen in ed with complaints of AMS from [facility] yesterday.  Yesterday at 3 pm when family saw patient speech was slurred with repeating same phrase and coughing While eating. And EMS was called today. This patient is currently enrolled in [facility] outpatient-based Palliative Care.       Pt has past medical history of allergy to aspirin /HCTZ and lisinopril.  Pt has pmh of ALS, congestive heart failure, depression, diabetes mellitus type 2, hypothyroidism, GERD, hepatitis C, hypertension, peripheral vascular disease.           ED Course:        Vitals:    06/10/22 1158 06/10/22 1159 06/10/22 1630  BP: 136/60   129/62  Pulse: 69   70  Resp: 18   11  Temp: 97.9 F (36.6 C)   99 F (37.2 C)  TempSrc: Axillary   Axillary  SpO2: 98%   99%  Weight:   88 kg    Height:   5' (1.524 m)    In ed pt is alert/awake nonverbal and afebrile. Labs today shows hypokalemia of 2.2 AG of 16 and WBC of 12 and Head ct negative for any acute findings. CTA chest shows: IMPRESSION: 1. No evidence acute pulmonary embolism. 2. No pulmonary infarction.  Moderate bibasilar atelectasis. 3. 3 mm left solid pulmonary nodule within the upper lobe.  Per By: Genevive Bi M.D.   On: 06/10/2022 17:59

## 2022-06-11 NOTE — Evaluation (Signed)
Clinical/Bedside Swallow Evaluation Patient Details  Name: Cynthia Landry MRN: [id number] Date of Birth: 1956/07/19  Today's Date: 06/11/2022 Time: SLP Start Time (ACUTE ONLY): 1310 SLP Stop Time (ACUTE ONLY): 1415 SLP Time Calculation (min) (ACUTE ONLY): 65 min  Past Medical History:  Past Medical History:  Diagnosis Date   ALS (amyotrophic lateral sclerosis) (HCC)    Amyotrophic lateral sclerosis (HCC)    Anxiety    Ataxia    CHF (congestive heart failure) (HCC)    Chronic pain    Depression    Diabetes mellitus without complication (HCC)    GERD (gastroesophageal reflux disease)    Hepatitis C    Hyperlipidemia    Hypertension    Psychosis (HCC)    PVD (peripheral vascular disease) (HCC)    Thyroid disease    Vitamin D deficiency    Past Surgical History:  Past Surgical History:  Procedure Laterality Date   COLONOSCOPY     ESOPHAGOGASTRODUODENOSCOPY (EGD) WITH PROPOFOL N/A 12/30/2017   Procedure: ESOPHAGOGASTRODUODENOSCOPY (EGD) WITH PROPOFOL;  Surgeon: Christena Deem, MD;  Location: [facility] ENDOSCOPY;  Service: Endoscopy;  Laterality: N/A;   SHOULDER SURGERY     HPI:  Pt is a 66 y.o. female seen in ed with complaints of AMS from [facility] facility yesterday.  Pt has PMH including ALS, congestive heart failure, depression, diabetes mellitus type 2, hypothyroidism, GERD, hepatitis C, hypertension, peripheral vascular disease.  She has lived in a SNF [facility]) since October 2015.  Per chart, she spends her time mostly in bed and some time in wheelchair.  She is followed by Palliative Care at her facility monthly. It does not appear that she has followed up at the ALS Clinic at [facility] in recent 1-2 years.   Assessment / Plan / Recommendation  Clinical Impression   Pt seen for BSE today; Family present w/ pt in the ED. Pt was awake, verbally responded w/ 1-3 word responses to direct questions/engagement. Pt requires total support for positioning and for  feeding d/t ALS at baseline. Pt is followed by Palliative Care services at her Facility; her Dysphagia is monitored. Pt also presents w/ Dysarthria as a direct result of the ALS(reduced articulation/intelligibility, reduced volume). Pt also utilized nonverbal means of communication w/ head nodding for conservation of energy.  Per pt and Family, she eats a somewhat mech soft diet consistency w/ Family "chopping" the foods "more" at "some meals". Pt has few Dentition which impacts mastication ability also in conjunction w/ the ALS. Family stated she does "fine" at [facility] w/ support. No recent weight loss noted.  Pt appears to present w/ oropharyngeal phase dysphagia w/ suspected Neuromuscular impact from the ALS. No overt clinical s/s of aspiration during assessment of oral intake; moreso oral phase deficits w/ bolus management. Any oral phase deficits can impact the pharyngeal phase of swallowing.   Pt is at increased risk for aspiration/aspiration pneumonia in setting of baseline medical status including ALS, dependency for feeding, and oropharyngeal phase dysphagia.  Pt was fed trials but needed no cues for follow through w/ aspiration precautions during intake/trials. She consumed trials of single ice chips, thin liquids via Small straw sips, purees, and softened solids(single small pieces) w/ No overt clinical s/s of aspiration noted; no coughing nor throat clearing, no decline in respiratory presentation noted during/post trials, no change in vocal quality. O2 sats remained 96-97%. OF NOTE: pt did exhibit a consistent Audible swallow w/ all trial consistencies.  Oral phase was c/b min slower, deliberate  bolus management and oral clearing of all boluses given. Reduced bolus coordination during A-P transfer. She took her Time w/ the small trials. Post swallow, full oral clearing noted. Time b/t trials given for conservation of energy. Noted increased Time for mastication/mashing of softened solids d/t Missing  Dentition.  OM exam revealed adequate posterior lingual bunching/strength; anterior and lateral lingual weakness noted(particularly protrusion); CN XII involvement. Cough effort weak. Suspect direct impact from ALS.   Recommend continue a dysphagia level 3 diet(w/ MINCED meats) w/ thin liquids; strict aspiration precautions including SMALL BITES AND SIPS ONLY; Pills Crushed vs Whole in Puree for safer swallowing; feeding support and supervision at meals, reduce Distractions/talking during meals. Give Time during the meal for conservation of energy. NSG/MD updated SLP Visit Diagnosis: Dysphagia, oropharyngeal phase (R13.12) (baseline ALS; dependent for feeding)    Aspiration Risk  Mild aspiration risk;Moderate aspiration risk;Risk for inadequate nutrition/hydration in setting of Baseline ALS   Diet Recommendation   dysphagia level 3 diet(w/ MINCED meats) w/ thin liquids; strict aspiration precautions including SMALL BITES AND SIPS ONLY; feeding support and supervision at meals, reduce Distractions/talking during meals. Give Time during the meal for conservation of energy  Medication Administration: Crushed with puree (vs Whole in Puree)    Other  Recommendations Recommended Consults:  (Dietician f/u for support; Palliative Care ongoing at facility) Oral Care Recommendations: Oral care BID;Oral care before and after PO;Staff/trained caregiver to provide oral care Other Recommendations:  (n/a)    Recommendations for follow up therapy are one component of a multi-disciplinary discharge planning process, led by the attending physician.  Recommendations may be updated based on patient status, additional functional criteria and insurance authorization.  Follow up Recommendations Follow physician's recommendations for discharge plan and follow up therapies (at her facility)      Assistance Recommended at Discharge Frequent or constant Supervision/Assistance (feeding support d/t ALS)  Functional  Status Assessment Patient has had a recent decline in their functional status and demonstrates the ability to make significant improvements in function in a reasonable and predictable amount of time.  Frequency and Duration  (n/a)   (n/a)       Prognosis Prognosis for Safe Diet Advancement: Fair Barriers to Reach Goals: Time post onset;Severity of deficits Barriers/Prognosis Comment: baseline ALS; dependent for feeding      Swallow Study   General Date of Onset: 06/10/22 HPI: Pt is a 66 y.o. female seen in ed with complaints of AMS from [facility] facility yesterday.  Pt has PMH including ALS, congestive heart failure, depression, diabetes mellitus type 2, hypothyroidism, GERD, hepatitis C, hypertension, peripheral vascular disease.  She has lived in a SNF [facility]) since October 2015.  Per chart, she spends her time mostly in bed and some time in wheelchair.  She is followed by Palliative Care at her facility monthly. Type of Study: Bedside Swallow Evaluation Previous Swallow Assessment: she has previously been seen at the ALS clinic but not recently per report. Pt and family endorse some Dysphagia but it is managed by following aspiration precautions during all oral intake -- this is monitored by Palliative Care(per notes). Diet Prior to this Study: Dysphagia 3 (soft);Thin liquids Temperature Spikes Noted: No (wbc 9.1) Respiratory Status: Nasal cannula (2L) History of Recent Intubation: No Behavior/Cognition: Alert;Cooperative;Pleasant mood;Requires cueing (min; dysarthria(ALS)) Oral Cavity Assessment: Excessive secretions;Within Functional Limits (slight but managed w/ no drooling noted) Oral Care Completed by SLP: Recent completion by staff Oral Cavity - Dentition: Poor condition;Missing dentition (only few Dentition) Vision:  (  n/a) Self-Feeding Abilities: Total assist (ALS) Patient Positioning: Upright in bed (required positioning support) Baseline Vocal Quality:  Low vocal intensity (min) Volitional Cough: Weak Volitional Swallow: Able to elicit (delay)    Oral/Motor/Sensory Function Overall Oral Motor/Sensory Function: Mild impairment (-Moderate) Facial ROM: Within Functional Limits Facial Symmetry: Within Functional Limits Lingual ROM: Within Functional Limits Lingual Symmetry: Within Functional Limits Lingual Strength: Reduced;Suspected CN XII (hypoglossal) dysfunction (anterior- weak; posterior WFL) Velum: Within Functional Limits Mandible: Within Functional Limits (grossly)   Ice Chips Ice chips: Impaired Presentation: Spoon (fed; 2 trials) Oral Phase Impairments: Reduced lingual movement/coordination (min) Oral Phase Functional Implications: Prolonged oral transit Pharyngeal Phase Impairments:  (none)   Thin Liquid Thin Liquid: Impaired (min) Presentation: Straw (fed; 10 trials) Oral Phase Impairments: Reduced lingual movement/coordination (min) Oral Phase Functional Implications: Prolonged oral transit (min) Pharyngeal  Phase Impairments: Suspected delayed Swallow (w/ audible swallows; no other overt s/s)    Nectar Thick Nectar Thick Liquid: Not tested   Honey Thick Honey Thick Liquid: Not tested   Puree Puree: Impaired Presentation: Spoon (fed; 5 trials) Oral Phase Impairments: Reduced lingual movement/coordination (min) Oral Phase Functional Implications: Prolonged oral transit (min) Pharyngeal Phase Impairments:  (audible swallows; no other overt s/s)   Solid     Solid: Impaired Presentation: Spoon (fed; 3 trials) Oral Phase Impairments: Reduced lingual movement/coordination;Impaired mastication (min; missing most Dentition) Oral Phase Functional Implications: Impaired mastication;Prolonged oral transit (min) Pharyngeal Phase Impairments:  (audible swallows; no other overt s/s) Other Comments: moistened foods well        Jerilynn Som, MS, CCC-SLP Speech Language Pathologist Rehab Services; [facility] [phone] (ascom) Derita Michelsen 06/11/2022,5:46 PM

## 2022-06-11 NOTE — Inpatient Diabetes Management (Signed)
Inpatient Diabetes Program Recommendations  AACE/ADA: New Consensus Statement on Inpatient Glycemic Control (2015)  Target Ranges:  Prepandial:   less than 140 mg/dL      Peak postprandial:   less than 180 mg/dL (1-2 hours)      Critically ill patients:  140 - 180 mg/dL   Lab Results  Component Value Date   GLUCAP 215 (H) 06/11/2022   HGBA1C 9.5 (H) 06/10/2022    Review of Glycemic Control  Latest Reference Range & Units 06/11/22 06:23 06/11/22 11:43  Glucose-Capillary 70 - 99 mg/dL 338 (H) 250 (H)  (H): Data is abnormally high  Diabetes history: DM2 Outpatient Diabetes medications: Lantus 10 units QD, Humalog 5 units TID and Metformin 500 mg QD Current orders for Inpatient glycemic control: Novolog 0-15 units TID  Inpatient Diabetes Program Recommendations:    Might consider, Novolog 3 units TID with meals IF consumes at least 50%.  Will continue to follow while inpatient.  Thank you, Dulce Sellar, MSN, CDCES Diabetes Coordinator Inpatient Diabetes Program [phone] (team pager from 8a-5p)

## 2022-06-11 NOTE — ED Notes (Signed)
Pt alert, family with pt.  Meds given .  Nsr on monitor

## 2022-06-11 NOTE — Assessment & Plan Note (Signed)
-   Urinalysis negative for leukocytes, positive for nitrite, rare bacteria noted, 0-5 WBC -Urine cultures pending -Patient presenting with altered mental status/acute encephalopathy in the setting of ALS. -We will treat empirically with IV Rocephin pending culture results.

## 2022-06-11 NOTE — ED Notes (Signed)
Pt being fed breakfast by sister.  Pt seems to be tolerating it well.  Pt reminded to take small bites/sips.

## 2022-06-11 NOTE — Progress Notes (Signed)
OT Cancellation Note  Patient Details Name: Cynthia Landry MRN: [id number] DOB: October 15, 1956   Cancelled Treatment:    Reason Eval/Treat Not Completed: Medical issues which prohibited therapy. Consult received, chart reviewed. Pt last documented  K+ 2.2. Has been given medication to address. Pending updated labs as well as stat troponin. Will hold OT evaluation at this time and re-attempt pending additional labs to ensure pt is medically appropriate.   Arman Filter., MPH, MS, OTR/L ascom [phone] 06/11/22, 8:38 AM

## 2022-06-11 NOTE — ED Notes (Signed)
Resumed care from alana rn.  Pt alert.  Family at bedside.  Pt waiting on admission.

## 2022-06-11 NOTE — Procedures (Signed)
Patient Name: Cynthia Landry  MRN: [id number]  Epilepsy Attending: Charlsie Quest  Referring Physician/Provider: Gertha Calkin, MD  Date: 06/11/2022 Duration: 21.03 mins  Patient history: 66yo F with ams. EEG to evaluate for seizure.  Level of alertness: Awake  AEDs during EEG study: VPA, GBP  Technical aspects: This EEG study was done with scalp electrodes positioned according to the 10-20 International system of electrode placement. Electrical activity was acquired at a sampling rate of 500Hz  and reviewed with a high frequency filter of 70Hz  and a low frequency filter of 1Hz . EEG data were recorded continuously and digitally stored.   Description: The posterior dominant rhythm consists of 7 Hz activity of moderate voltage (25-35 uV) seen predominantly in posterior head regions, symmetric and reactive to eye opening and eye closing. EEG showed continuous generalized 5 to 7 Hz theta slowing. Hyperventilation and photic stimulation were not performed.     ABNORMALITY - Continuous slow, generalized - Background slow  IMPRESSION: This study is suggestive of moderate diffuse encephalopathy, nonspecific etiology. No seizures or epileptiform discharges were seen throughout the recording.  Jenascia Bumpass 

## 2022-06-11 NOTE — Progress Notes (Signed)
*  PRELIMINARY RESULTS* Echocardiogram 2D Echocardiogram has been performed.  Cristela Blue 06/11/2022, 9:31 AM

## 2022-06-11 NOTE — Progress Notes (Addendum)
PROGRESS NOTE    Cynthia Landry  KTG:[id number] DOB: 02/26/1956 DOA: 06/10/2022 PCP: Almetta Lovely, [facility]    Chief Complaint  Patient presents with   Altered Mental Status    Brief Narrative:  HPI per Dr. Elba Barman is a 66 y.o. female seen in ed with complaints of AMS from [facility] yesterday.  Yesterday at 3 pm when family saw patient speech was slurred with repeating same phrase and coughing While eating. And EMS was called today. This patient is currently enrolled in [facility] outpatient-based Palliative Care.       Pt has past medical history of allergy to aspirin /HCTZ and lisinopril.  Pt has pmh of ALS, congestive heart failure, depression, diabetes mellitus type 2, hypothyroidism, GERD, hepatitis C, hypertension, peripheral vascular disease.           ED Course:        Vitals:    06/10/22 1158 06/10/22 1159 06/10/22 1630  BP: 136/60   129/62  Pulse: 69   70  Resp: 18   11  Temp: 97.9 F (36.6 C)   99 F (37.2 C)  TempSrc: Axillary   Axillary  SpO2: 98%   99%  Weight:   88 kg    Height:   5' (1.524 m)    In ed pt is alert/awake nonverbal and afebrile. Labs today shows hypokalemia of 2.2 AG of 16 and WBC of 12 and Head ct negative for any acute findings. CTA chest shows: IMPRESSION: 1. No evidence acute pulmonary embolism. 2. No pulmonary infarction.  Moderate bibasilar atelectasis. 3. 3 mm left solid pulmonary nodule within the upper lobe.  Per By: Genevive Bi M.D.   On: 06/10/2022 17:59      Assessment & Plan:  Principal Problem:   AMS (altered mental status) Active Problems:   Metabolic acidosis   Dysphagia   Depression   Hypothyroidism   Hypertension, benign   Leukocytosis   Diabetes mellitus without complication (HCC)   COPD (chronic obstructive pulmonary disease) (HCC)   CHF (congestive heart failure) (HCC)   GERD (gastroesophageal reflux disease)   Hypokalemia   UTI (urinary tract infection):  pROBABLE    Assessment and Plan: * AMS (altered mental status) Altered mental status/acute metabolic encephalopathy likely multifactorial secondary to UTI, in the setting of severe electrolyte abnormalities of hypokalemia, hypomagnesemia. -Due to presentation with slurred speech and choking concern for possible TIA versus CVA.  -CT head with similar ventriculomegaly out of proportion to degree of sulcal enlargement, potentially secondary to NPH, otherwise no evidence of acute abnormality. -MRI brain MRA head with no acute intracranial abnormality, diffuse ventricular prominence out of proportion to cortical sulcation, while this finding may impart be related to underlying cerebral atrophy, component of NPH could be considered in the correct clinical setting.  Moderately advanced chronic microvascular ischemic disease.  Negative intracranial MRA.  No LVO.   -EEG done suggestive of moderate diffuse encephalopathy, nonspecific etiology, no seizures or epileptiform discharges were seen throughout the recording. -Urinalysis concerning for UTI.   -Patient pancultured.   -Place empirically on IV Rocephin.   -IV fluids, supportive care.    UTI (urinary tract infection): pROBABLE - Urinalysis negative for leukocytes, positive for nitrite, rare bacteria noted, 0-5 WBC -Urine cultures pending -Patient presenting with altered mental status/acute encephalopathy in the setting of ALS. -We will treat empirically with IV Rocephin pending culture results.  Hypokalemia - Patient with a severe hypokalemia with potassium noted at 2.2 on presentation. -Repeat  labs this morning with potassium at 2.2. -K-Dur 40 mEq p.o. every 4 hours x3 doses. -KCl 10 mEq IV every hour x3 rounds. -Magnesium noted at 2.4. -Repeat labs in the AM.  GERD (gastroesophageal reflux disease) - Place on IV PPI.  CHF (congestive heart failure) (HCC) Stable and compensated. No edema JVD or 3rd heart sound. Continue to hold  diuretics.   -Resume home regimen Coreg.  -Strict I's and O's, daily weights.  COPD (chronic obstructive pulmonary disease) (HCC) Stable no wheezing. SpO2: 99 % O2 Flow Rate (L/min): 2 L/min Currently on 2 L [place].    Diabetes mellitus without complication (HCC) - CBG 150 this morning. -Hemoglobin A1c 9.5 (06/10/2022). -Continue to hold home regimen long-acting insulin.   -SSI.   Leukocytosis ? Etiology. -Likely secondary to UTI. -Continue IV antibiotics. -Follow.  Hypertension, benign Blood pressure 129/62, pulse 70, temperature 99 F (37.2 C), temperature source Axillary, resp. rate 11, height 5' (1.524 m), weight 88 kg, SpO2 99 %.  Vitals:   06/10/22 1158 06/10/22 1630  BP: 136/60 129/62  Blood pressure noted to be low normal on presentation and as such we will continue to hold antihypertensive medications of Lasix, Entresto.       Hypothyroidism -Continue Synthroid.    Depression - Resume home regimen Cymbalta.  Dysphagia Patient assessed by speech therapy and placed on dysphagia 3 diet. -Follow.   Metabolic acidosis Elevated Ag. -Likely secondary to acute metabolic derangement/acute infection. -Lactic acid level within normal limits. -Renal function stable. -Repeat labs with resolution of anion gap. -Patient not acidotic on labs. -IV fluids, IV antibiotics, supportive care.         DVT prophylaxis: Lovenox Code Status: DNR Family Communication: Updated sister at bedside. Disposition: Likely back to nursing facility when clinically improved and at baseline with resolution of culture results.  Status is: Inpatient Remains inpatient appropriate because: Severity of illness   Consultants:  None  Procedures:  CT angiogram chest 06/10/2022 Chest x-ray 06/10/2022 MRI brain MRA head 06/10/2022 2D echo 06/11/2022 EEG 06/11/2022 CT head 06/10/2022      Antimicrobials:  IV Rocephin 06/11/2022>>>>   Subjective: Patient laying on gurney.  Notes  and shakes her head to answer questions.  Sister at bedside who states patient looks a little bit better than she did on admission.  Objective: Vitals:   06/11/22 1540 06/11/22 1700 06/11/22 1746 06/11/22 1900  BP: 104/60 115/60  (!) 113/59  Pulse: 73 71  73  Resp: 19 15  15   Temp:   98.3 F (36.8 C)   TempSrc:   Oral   SpO2: 100% 100%  100%  Weight:      Height:        Intake/Output Summary (Last 24 hours) at 06/11/2022 1921 Last data filed at 06/11/2022 0256 Gross per 24 hour  Intake 1250 ml  Output --  Net 1250 ml   Filed Weights   06/10/22 1159  Weight: 88 kg    Examination:  General exam: Appears calm and comfortable.  Nonverbal.  Lower extremity contractures. Respiratory system: Clear to auscultation the lung fields.  No wheezes, no crackles, no rhonchi. Respiratory effort normal. Cardiovascular system: S1 & S2 heard, RRR. No JVD, murmurs, rubs, gallops or clicks. No pedal edema. Gastrointestinal system: Abdomen is nondistended, soft and tenderness to palpation in the suprapubic region, positive bowel sounds.  No rebound.  No guarding. Central nervous system: Alert. No focal neurological deficits. Extremities: Lower extremities and contractures. Skin: No rashes, lesions or ulcers  Psychiatry: Judgement and insight unable to assess. Mood & affect appropriate.     Data Reviewed:   CBC: Recent Labs  Lab 06/10/22 1245 June 27, 2022 0852  WBC 12.0* 9.1  NEUTROABS 4.9 4.0  HGB 15.0 14.0  HCT 49.4* 46.6*  MCV 91.7 91.7  PLT 306 310    Basic Metabolic Panel: Recent Labs  Lab 06/10/22 1245 06-27-2022 0852  NA 142 142  K 2.2* 2.2*  CL 84* 90*  CO2 42* 39*  GLUCOSE 205* 177*  BUN 13 8  CREATININE 0.62 0.42*  CALCIUM 8.8* 8.1*  MG 1.7  1.9 2.4    GFR: Estimated Creatinine Clearance: 68.3 mL/min (A) (by C-G formula based on SCr of 0.42 mg/dL (L)).  Liver Function Tests: Recent Labs  Lab 06/10/22 1245 06-27-22 0852  AST 35 34  ALT 21 20  ALKPHOS 51 46   BILITOT 1.3* 1.3*  PROT 8.2* 7.3  ALBUMIN 3.3* 3.0*    CBG: Recent Labs  Lab 06-27-2022 0623 06/27/22 1143 2022-06-27 1751  GLUCAP 150* 215* 143*     Recent Results (from the past 240 hour(s))  Culture, blood (Routine X 2) w Reflex to ID Panel     Status: None (Preliminary result)   Collection Time: 06/10/22  8:02 PM   Specimen: BLOOD  Result Value Ref Range Status   Specimen Description BLOOD RIGHT FA  Final   Special Requests   Final    BOTTLES DRAWN AEROBIC AND ANAEROBIC Blood Culture adequate volume   Culture   Final    NO GROWTH < 12 HOURS Performed at [facility], [address] [place], [place] [address]    Report Status PENDING  Incomplete  Culture, blood (Routine X 2) w Reflex to ID Panel     Status: None (Preliminary result)   Collection Time: 06/10/22  8:02 PM   Specimen: BLOOD  Result Value Ref Range Status   Specimen Description BLOOD RIGHT FA  Final   Special Requests   Final    BOTTLES DRAWN AEROBIC AND ANAEROBIC Blood Culture results may not be optimal due to an inadequate volume of blood received in culture bottles   Culture   Final    NO GROWTH < 12 HOURS Performed at [facility], [address] [place], [place] [address]    Report Status PENDING  Incomplete         Radiology Studies: EEG adult  Result Date: June 27, 2022 Charlsie Quest, MD     06/27/2022  6:47 PM Patient Name: Cynthia Landry MRN: [id number] Epilepsy Attending: Charlsie Quest Referring Physician/Provider: Gertha Calkin, MD Date: 06/27/2022 Duration: 21.03 mins Patient history: 66yo F with ams. EEG to evaluate for seizure. Level of alertness: Awake AEDs during EEG study: VPA, GBP Technical aspects: This EEG study was done with scalp electrodes positioned according to the 10-20 International system of electrode placement. Electrical activity was acquired at a sampling rate of  and reviewed with a high frequency filter of  and a low frequency filter of  . EEG data were recorded continuously and digitally stored. Description: The posterior dominant rhythm consists of 7 Hz activity of moderate voltage (25-35 uV) seen predominantly in posterior head regions, symmetric and reactive to eye opening and eye closing. EEG showed continuous generalized 5 to 7 Hz theta slowing. Hyperventilation and photic stimulation were not performed.   ABNORMALITY - Continuous slow, generalized - Background slow IMPRESSION: This study is suggestive of moderate diffuse encephalopathy, nonspecific etiology. No seizures  or epileptiform discharges were seen throughout the recording. Charlsie Quest   ECHOCARDIOGRAM COMPLETE BUBBLE STUDY  Result Date: 06/11/2022    ECHOCARDIOGRAM REPORT   Patient Name:   Cynthia Landry Date of Exam: 06/11/2022 Medical Rec #:  [id number]       Height:       60.0 in Accession #:    [id number]      Weight:       194.0 lb Date of Birth:  Jan 18, 1956       BSA:          1.843 m Patient Age:    65 years        BP:           133/63 mmHg Patient Gender: F               HR:           76 bpm. Exam Location:  [facility] Procedure: 2D Echo, Cardiac Doppler, Intracardiac Opacification Agent and Saline            Contrast Bubble Study Indications:     Stroke 434.91 / I63.9  History:         Patient has no prior history of Echocardiogram examinations.                  CHF; Risk Factors:Hypertension and Dyslipidemia.  Sonographer:     Cristela Blue Referring Phys:  [id number] EKTA V PATEL Diagnosing Phys: Yvonne Kendall MD  Sonographer Comments: Technically difficult study due to poor echo windows, no parasternal window and no subcostal window. IMPRESSIONS  1. Left ventricular ejection fraction, by estimation, is >55%. The left ventricle has normal function. Left ventricular endocardial border not optimally defined to evaluate regional wall motion.  2. Right ventricular systolic function was not well visualized. The right ventricular size is not well visualized.  3. The mitral valve  was not well visualized. No evidence of mitral valve regurgitation.  4. The aortic valve was not well visualized. Aortic valve regurgitation is not visualized. No aortic stenosis is present.  5. Bubble study is nondiagnostic due to poor acoustic windows. FINDINGS  Left Ventricle: Left ventricular size and wall thickness are not well-assessed due to poor acoustic windows. Left ventricular ejection fraction, by estimation, is >55%. The left ventricle has normal function. Left ventricular endocardial border not optimally defined to evaluate regional wall motion. Definity contrast agent was given IV to delineate the left ventricular endocardial borders. Right Ventricle: The right ventricular size is not well visualized. Right vetricular wall thickness was not well visualized. Right ventricular systolic function was not well visualized. Left Atrium: Left atrial size was not well visualized. Right Atrium: Right atrial size was not well visualized. Pericardium: The pericardium was not well visualized. Mitral Valve: The mitral valve was not well visualized. No evidence of mitral valve regurgitation. Tricuspid Valve: The tricuspid valve is not well visualized. Tricuspid valve regurgitation is trivial. Aortic Valve: The aortic valve was not well visualized. Aortic valve regurgitation is not visualized. No aortic stenosis is present. Aortic valve mean gradient measures 2.0 mmHg. Aortic valve peak gradient measures 3.4 mmHg. Pulmonic Valve: The pulmonic valve was not well visualized. Aorta: The aortic root was not well visualized. IAS/Shunts: The interatrial septum was not well visualized. Agitated saline contrast was given intravenously to evaluate for intracardiac shunting. Bubble study is nondiagnostic due to poor acoustic windows.   Diastology LV e' medial:    3.70 cm/s LV E/e' medial:  21.6  LV e' lateral:   4.24 cm/s LV E/e' lateral: 18.9  RIGHT VENTRICLE RV Basal diam:  3.30 cm RV S prime:     11.30 cm/s TAPSE (M-mode):  1.0 cm LEFT ATRIUM           Index LA Vol (A2C): 29.0 ml 15.74 ml/m LA Vol (A4C): 41.4 ml 22.47 ml/m  AORTIC VALVE AV Vmax:           91.80 cm/s AV Vmean:          61.300 cm/s AV VTI:            0.139 m AV Peak Grad:      3.4 mmHg AV Mean Grad:      2.0 mmHg LVOT Vmax:         90.10 cm/s LVOT Vmean:        61.000 cm/s LVOT VTI:          0.177 m LVOT/AV VTI ratio: 1.27 MITRAL VALVE                TRICUSPID VALVE MV Area (PHT): 2.70 cm     TR Peak grad:   7.5 mmHg MV Decel Time: 281 msec     TR Vmax:        137.00 cm/s MV E velocity: 80.10 cm/s MV A velocity: 106.00 cm/s  SHUNTS MV E/A ratio:  0.76         Systemic VTI: 0.18 m Yvonne Kendall MD Electronically signed by Yvonne Kendall MD Signature Date/Time: 06/11/2022/3:14:28 PM    Final    MR BRAIN WO CONTRAST  Result Date: 06/10/2022 CLINICAL DATA:  Initial evaluation for neuro deficit, stroke suspected. EXAM: MRI HEAD WITHOUT CONTRAST MRA HEAD WITHOUT CONTRAST TECHNIQUE: Multiplanar, multi-echo pulse sequences of the brain and surrounding structures were acquired without intravenous contrast. Angiographic images of the Circle of Willis were acquired using MRA technique without intravenous contrast. COMPARISON:  CT from earlier the same day. FINDINGS: MRI HEAD FINDINGS Brain: Cerebral volume within normal limits for age. Scattered patchy T2/FLAIR hyperintensity involving the periventricular deep white matter both cerebral hemispheres, most consistent with chronic small vessel ischemic disease, moderately advanced in nature. No evidence for acute or subacute ischemia. Gray-white matter differentiation maintained. No areas of chronic cortical infarction. No acute or chronic intracranial blood products. No mass lesion or midline shift. Diffuse ventricular prominence out of proportion to cortical sulcation noted. No extra-axial fluid collection. Pituitary gland and suprasellar region within normal limits. Vascular: Major intracranial vascular flow voids are  maintained. Skull and upper cervical spine: Craniocervical junction normal. Bone marrow signal intensity within normal limits. No scalp soft tissue abnormality. Sinuses/Orbits: Globes and orbital soft tissues within normal limits. Paranasal sinuses and mastoid air cells are clear. Other: None. MRA HEAD FINDINGS Anterior circulation: Both internal carotid arteries patent to the termini without stenosis. A1 segments patent bilaterally. Normal anterior communicating artery complex. Anterior cerebral arteries widely patent. No M1 stenosis or occlusion. No proximal MCA branch occlusion. Distal MCA branches perfused and symmetric. Posterior circulation: Both V4 segments patent without stenosis. Left PICA patent. Right PICA not well seen. Basilar patent to its distal aspect without stenosis. Superior cerebellar and posterior cerebral arteries patent bilaterally. Anatomic variants: None significant.  No aneurysm. IMPRESSION: MRI HEAD IMPRESSION: 1. No acute intracranial abnormality. 2. Diffuse ventricular prominence out of proportion to cortical sulcation. While this finding may in part be related to underlying cerebral atrophy, a component of normal pressure hydrocephalus could be considered in the correct  clinical setting. 3. Moderately advanced chronic microvascular ischemic disease. MRA HEAD IMPRESSION: Negative intracranial MRA. No large vessel occlusion, hemodynamically significant stenosis, or other acute vascular abnormality. Electronically Signed   By: Rise Mu M.D.   On: 06/10/2022 23:03   MR ANGIO HEAD WO CONTRAST  Result Date: 06/10/2022 CLINICAL DATA:  Initial evaluation for neuro deficit, stroke suspected. EXAM: MRI HEAD WITHOUT CONTRAST MRA HEAD WITHOUT CONTRAST TECHNIQUE: Multiplanar, multi-echo pulse sequences of the brain and surrounding structures were acquired without intravenous contrast. Angiographic images of the Circle of Willis were acquired using MRA technique without intravenous  contrast. COMPARISON:  CT from earlier the same day. FINDINGS: MRI HEAD FINDINGS Brain: Cerebral volume within normal limits for age. Scattered patchy T2/FLAIR hyperintensity involving the periventricular deep white matter both cerebral hemispheres, most consistent with chronic small vessel ischemic disease, moderately advanced in nature. No evidence for acute or subacute ischemia. Gray-white matter differentiation maintained. No areas of chronic cortical infarction. No acute or chronic intracranial blood products. No mass lesion or midline shift. Diffuse ventricular prominence out of proportion to cortical sulcation noted. No extra-axial fluid collection. Pituitary gland and suprasellar region within normal limits. Vascular: Major intracranial vascular flow voids are maintained. Skull and upper cervical spine: Craniocervical junction normal. Bone marrow signal intensity within normal limits. No scalp soft tissue abnormality. Sinuses/Orbits: Globes and orbital soft tissues within normal limits. Paranasal sinuses and mastoid air cells are clear. Other: None. MRA HEAD FINDINGS Anterior circulation: Both internal carotid arteries patent to the termini without stenosis. A1 segments patent bilaterally. Normal anterior communicating artery complex. Anterior cerebral arteries widely patent. No M1 stenosis or occlusion. No proximal MCA branch occlusion. Distal MCA branches perfused and symmetric. Posterior circulation: Both V4 segments patent without stenosis. Left PICA patent. Right PICA not well seen. Basilar patent to its distal aspect without stenosis. Superior cerebellar and posterior cerebral arteries patent bilaterally. Anatomic variants: None significant.  No aneurysm. IMPRESSION: MRI HEAD IMPRESSION: 1. No acute intracranial abnormality. 2. Diffuse ventricular prominence out of proportion to cortical sulcation. While this finding may in part be related to underlying cerebral atrophy, a component of normal pressure  hydrocephalus could be considered in the correct clinical setting. 3. Moderately advanced chronic microvascular ischemic disease. MRA HEAD IMPRESSION: Negative intracranial MRA. No large vessel occlusion, hemodynamically significant stenosis, or other acute vascular abnormality. Electronically Signed   By: Rise Mu M.D.   On: 06/10/2022 23:03   CT Angio Chest PE W/Cm &/Or Wo Cm  Result Date: 06/10/2022 CLINICAL DATA:  History of the LS. Bed-bound. New oxygen requirement. Concern pulmonary embolism. EXAM: CT ANGIOGRAPHY CHEST WITH CONTRAST TECHNIQUE: Multidetector CT imaging of the chest was performed using the standard protocol during bolus administration of intravenous contrast. Multiplanar CT image reconstructions and MIPs were obtained to evaluate the vascular anatomy. RADIATION DOSE REDUCTION: This exam was performed according to the departmental dose-optimization program which includes automated exposure control, adjustment of the mA and/or kV according to patient size and/or use of iterative reconstruction technique. CONTRAST:  75mL OMNIPAQUE IOHEXOL 350 MG/ML SOLN COMPARISON:  None Available. FINDINGS: Cardiovascular: No filling defects within the pulmonary arteries to suggest acute pulmonary embolism. Mediastinum/Nodes: No axillary or supraclavicular adenopathy. No mediastinal or hilar adenopathy. No pericardial fluid. Esophagus normal. Lungs/Pleura: Moderate bibasilar atelectasis. No pulmonary infarction. No airspace disease. No pneumothorax. 3 mm nodule in the LEFT upper lobe (image 88/6 Upper Abdomen: Limited view of the liver, kidneys, pancreas are unremarkable. Normal adrenal glands. Musculoskeletal: No aggressive osseous lesion. Review  of the MIP images confirms the above findings. IMPRESSION: 1. No evidence acute pulmonary embolism. 2. No pulmonary infarction.  Moderate bibasilar atelectasis. 3. 3 mm left solid pulmonary nodule within the upper lobe. Per Fleischner Society Guidelines,  if patient is low risk for malignancy, no routine follow-up imaging is recommended; if patient is high risk for malignancy, a non-contrast Chest CT at 12 months is optional. If performed and the nodule is stable at 12 months, no further follow-up is recommended. These guidelines do not apply to immunocompromised patients and patients with cancer. Follow up in patients with significant comorbidities as clinically warranted. For lung cancer screening, adhere to Lung-RADS guidelines. Reference: Radiology. 2017; 284(1):228-43. Electronically Signed   By: Genevive Bi M.D.   On: 06/10/2022 17:59   DG Chest Port 1 View  Result Date: 06/10/2022 CLINICAL DATA:  Cough and altered mental status EXAM: PORTABLE CHEST 1 VIEW COMPARISON:  04/02/2021, 11/01/2015 FINDINGS: Partially visualized hardware in the left humerus. Low lying right humeral head. Low lung volumes with subsegmental atelectasis or scarring at the left base. Increased patchy airspace disease. Right lung is clear. Enlarged cardiomediastinal silhouette. IMPRESSION: 1. Low lung volumes. Subsegmental atelectasis or scar at the left base. Slight increased left basilar opacity could be due to atelectasis or pneumonia 2. Incompletely visualized right shoulder shows low lying right humeral head, correlate with physical examination with follow-up dedicated right shoulder radiographs if indicated Electronically Signed   By: Jasmine Pang M.D.   On: 06/10/2022 15:40   CT HEAD WO CONTRAST  Result Date: 06/10/2022 CLINICAL DATA:  Mental status change, unknown cause EXAM: CT HEAD WITHOUT CONTRAST TECHNIQUE: Contiguous axial images were obtained from the base of the skull through the vertex without intravenous contrast. RADIATION DOSE REDUCTION: This exam was performed according to the departmental dose-optimization program which includes automated exposure control, adjustment of the mA and/or kV according to patient size and/or use of iterative reconstruction  technique. COMPARISON:  Apr 18, 2019. FINDINGS: Brain: Similar ventriculomegaly out of proportion to the degree of sulcal enlargement. Additionally the corpus callosum angle is acute and there is chronic sulci at the vertex. No evidence of acute large vascular territory infarct, mass lesion, midline shift or extra-axial fluid collection. Mild patchy white matter hypodensities, nonspecific but compatible with chronic microvascular ischemic disease. Vascular: No hyperdense vessel identified. Skull: No acute fracture. Sinuses/Orbits: Clear visualized sinuses. No acute orbital findings. Other: No mastoid effusions. IMPRESSION: 1. 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 2. Otherwise, no evidence of acute abnormality. Electronically Signed   By: Feliberto Harts M.D.   On: 06/10/2022 13:23        Scheduled Meds:   stroke: early stages of recovery book   Does not apply Once   atorvastatin  20 mg Oral QHS   baclofen  5 mg Oral TID   bisacodyl  10 mg Rectal QHS   carvedilol  12.5 mg Oral BID   cyanocobalamin  500 mcg Oral Q breakfast   divalproex  250 mg Oral Daily   DULoxetine  60 mg Oral q morning   enoxaparin (LOVENOX) injection  40 mg Subcutaneous Q24H   gabapentin  300 mg Oral QHS   insulin aspart  0-15 Units Subcutaneous TID WC   latanoprost  1 drop Both Eyes QHS   levothyroxine  150 mcg Oral Q0600   pantoprazole (PROTONIX) IV  40 mg Intravenous Q24H   polyethylene glycol  17 g  Oral Daily   senna  2 tablet Oral BID   sertraline  25 mg Oral QPM   Continuous Infusions:  sodium chloride 100 mL/hr at 06/11/22 1148   cefTRIAXone (ROCEPHIN)  IV Stopped (06/11/22 0932)     LOS: 1 day    Time spent: 40 minutes    Ramiro Harvest, MD [facility]   To contact the attending provider between 7A-7P or the covering provider during after hours 7P-7A, please log into the web site  [email or web address] and access using universal [employer] password for that web site. If you do not have the password, please call the hospital operator.  06/11/2022, 7:21 PM

## 2022-06-11 NOTE — Assessment & Plan Note (Signed)
-   Patient with a severe hypokalemia with potassium noted at 2.2 on presentation. -Repeat labs this morning with potassium at 2.2. -K-Dur 40 mEq p.o. every 4 hours x3 doses. -KCl 10 mEq IV every hour x3 rounds. -Magnesium noted at 2.4. -Repeat labs in the AM.

## 2022-06-11 NOTE — ED Notes (Signed)
Fsbs 143

## 2022-06-11 NOTE — ED Notes (Signed)
Pt kept repeating what sounds like her back is hurting, pt repositioned with a pillow behind her and medicated with tylenol. Pt continued to cry out, but when I'm in the room pt does not cry out but I'm unable to understand exactly what she's saying, pt's daughter called to communicate what the pt is asking, pt told the daughter that its her butt, the light left on, and I'm being mean to her, the daughter ask how, but the pt keeps repeating the same thing, saying that I act different. Pt's daughter is on the way to be with the pt.

## 2022-06-11 NOTE — ED Notes (Signed)
Pt changed and repostioned in bed.  Family with pt awake.  oxygen cannula in place.

## 2022-06-11 NOTE — ED Notes (Signed)
Pt eating dinner meal  family with pt 

## 2022-06-11 NOTE — Progress Notes (Signed)
PT Cancellation Note  Patient Details Name: Cynthia Landry MRN: [id number] DOB: 06/23/1956   Cancelled Treatment:    Reason Eval/Treat Not Completed: Medical issues which prohibited therapy.  Chart reviewed pt following consult.  Pt last documented K+ 2.2 and given medication to address K+ concerns. Pending updated labs, as well as stat troponin.  Will hold PT evaluation at this time and re-attempt pending additional labs to ensure pt is medically appropriate.    Nolon Bussing, PT, DPT 06/11/22, 9:38 AM

## 2022-06-12 DIAGNOSIS — R404 Transient alteration of awareness: Secondary | ICD-10-CM | POA: Diagnosis not present

## 2022-06-12 LAB — CBG MONITORING, ED
Glucose-Capillary: 118 mg/dL — ABNORMAL HIGH (ref 70–99)
Glucose-Capillary: 122 mg/dL — ABNORMAL HIGH (ref 70–99)
Glucose-Capillary: 118 mg/dL — ABNORMAL HIGH (ref 70–99)
Glucose-Capillary: 174 mg/dL — ABNORMAL HIGH (ref 70–99)

## 2022-06-12 LAB — BASIC METABOLIC PANEL
Anion gap: 8 (ref 5–15)
BUN: <5 mg/dL — ABNORMAL LOW (ref 8–23)
CO2: 33 mmol/L — ABNORMAL HIGH (ref 22–32)
Calcium: 7.5 mg/dL — ABNORMAL LOW (ref 8.9–10.3)
Chloride: 98 mmol/L (ref 98–111)
Creatinine, Ser: 0.36 mg/dL — ABNORMAL LOW (ref 0.44–1.00)
GFR, Estimated: >60 mL/min (ref >60)
Glucose, Bld: 163 mg/dL — ABNORMAL HIGH (ref 70–99)
Potassium: 5.3 mmol/L — ABNORMAL HIGH (ref 3.5–5.1)
Sodium: 139 mmol/L (ref 135–145)

## 2022-06-12 LAB — GLUCOSE, CAPILLARY: Glucose-Capillary: 157 mg/dL — ABNORMAL HIGH (ref 70–99)

## 2022-06-12 MED ORDER — SODIUM CHLORIDE 0.9 % IV BOLUS
500.0000 mL | Freq: Once | INTRAVENOUS | Status: AC
Start: 2022-06-12 — End: 2022-06-12
  Administered 2022-06-12: 500 mL via INTRAVENOUS

## 2022-06-12 NOTE — Progress Notes (Signed)
Admission profile updated. ?

## 2022-06-12 NOTE — ED Notes (Signed)
Fsbs not done at midnight.

## 2022-06-12 NOTE — ED Notes (Signed)
Informed RN bed assigned 

## 2022-06-12 NOTE — NC FL2 (Signed)
[place] MEDICAID FL2 LEVEL OF CARE SCREENING TOOL     IDENTIFICATION  Patient Name: Cynthia Landry Birthdate: 08-21-1956 Sex: female Admission Date (Current Location): 06/10/2022  [facility] and [place] Number:  Chiropodist and Address:  [facility], [address] [place], [place] [address]      Provider Number: [id number]  Attending Physician Name and Address:  Azucena Fallen, MD  Relative Name and Phone Number:       Current Level of Care: Hospital Recommended Level of Care: Skilled Nursing Facility Prior Approval Number:    Date Approved/Denied:   PASRR Number: [id number]  Discharge Plan: SNF    Current Diagnoses: Patient Active Problem List   Diagnosis Date Noted   Hypokalemia 06/11/2022   UTI (urinary tract infection): pROBABLE 06/11/2022   Dehydration    CHF (congestive heart failure) (HCC) 06/10/2022   GERD (gastroesophageal reflux disease) 06/10/2022   AMS (altered mental status) 06/10/2022   Left leg pain    Sinus tachycardia    Intractable pain, left leg 04/02/2021   Leukocytosis 04/02/2021   Diabetes mellitus without complication (HCC) 04/02/2021   COPD (chronic obstructive pulmonary disease) (HCC) 04/02/2021   Debility 02/24/2019   Multifactorial functional impairment 12/30/2018   Palliative care encounter 12/30/2018   Dysphagia 12/30/2018   Metabolic acidosis 11/11/2015   Depression 12/06/2013   ALS (amyotrophic lateral sclerosis) (HCC) 02/25/2013   Chronic hepatitis C (HCC) 06/07/2011   Hypertension, benign 02/27/2011   Hypothyroidism 10/23/2010    Orientation RESPIRATION BLADDER Height & Weight     Self, Situation, Place  O2 (Nasal cannula 2 L)   Weight: 88 kg Height:  5' (152.4 cm)  BEHAVIORAL SYMPTOMS/MOOD NEUROLOGICAL BOWEL NUTRITION STATUS     (None)   Diet (DYS 3, Please send Meats MINCED w/ gravies added to moisten. Chop veggies some too!  Likes puddings.)  AMBULATORY STATUS COMMUNICATION  OF NEEDS Skin     Verbally Normal                       Personal Care Assistance Level of Assistance              Functional Limitations Info  Sight, Hearing, Speech Sight Info: Adequate Hearing Info: Adequate Speech Info: Impaired (Incomprehensible)    SPECIAL CARE FACTORS FREQUENCY                       Contractures Contractures Info: Present    Additional Factors Info  Code Status, Allergies, Psychotropic Code Status Info: DNR Allergies Info: Aspirin, Hctz (Hydrochlorothiazide), Lisinopril Psychotropic Info: Depression         Current Medications (06/12/2022):  This is the current hospital active medication list Current Facility-Administered Medications  Medication Dose Route Frequency Provider Last Rate Last Admin    stroke: early stages of recovery book   Does not apply Once Irena Cords V, MD       0.9 %  sodium chloride infusion   Intravenous Continuous Gertha Calkin, MD 50 mL/hr at 06/12/22 0416 Restarted at 06/12/22 0416   acetaminophen (TYLENOL) tablet 650 mg  650 mg Oral Q4H PRN Gertha Calkin, MD   650 mg at 06/11/22 0331   Or   acetaminophen (TYLENOL) 160 MG/5ML solution 650 mg  650 mg Per Tube Q4H PRN Gertha Calkin, MD       Or   acetaminophen (TYLENOL) suppository 650 mg  650 mg Rectal Q4H PRN Allena Katz,  Eliezer Mccoy, MD       atorvastatin (LIPITOR) tablet 20 mg  20 mg Oral QHS Irena Cords V, MD   20 mg at 06/11/22 2205   baclofen (LIORESAL) tablet 5 mg  5 mg Oral TID Rodolph Bong, MD   5 mg at 06/11/22 2229   bisacodyl (DULCOLAX) suppository 10 mg  10 mg Rectal QHS Rodolph Bong, MD   10 mg at 06/11/22 2229   carvedilol (COREG) tablet 12.5 mg  12.5 mg Oral BID Rodolph Bong, MD   12.5 mg at 06/11/22 2205   cefTRIAXone (ROCEPHIN) 2 g in sodium chloride 0.9 % 100 mL IVPB  2 g Intravenous Q24H Rodolph Bong, MD   Stopped at 06/11/22 0932   cyanocobalamin tablet 500 mcg  500 mcg Oral Q breakfast Rodolph Bong, MD       divalproex  (DEPAKOTE) DR tablet 250 mg  250 mg Oral Daily Irena Cords V, MD   250 mg at 06/11/22 0940   DULoxetine (CYMBALTA) DR capsule 60 mg  60 mg Oral q morning Rodolph Bong, MD   60 mg at 06/11/22 0941   enoxaparin (LOVENOX) injection 40 mg  40 mg Subcutaneous Q24H Rodolph Bong, MD       gabapentin (NEURONTIN) capsule 300 mg  300 mg Oral QHS Rodolph Bong, MD   300 mg at 06/11/22 2205   insulin aspart (novoLOG) injection 0-15 Units  0-15 Units Subcutaneous TID WC Gertha Calkin, MD   2 Units at 06/11/22 1756   latanoprost (XALATAN) 0.005 % ophthalmic solution 1 drop  1 drop Both Eyes QHS Irena Cords V, MD   1 drop at 06/11/22 2206   levothyroxine (SYNTHROID) tablet 150 mcg  150 mcg Oral Q0600 Rodolph Bong, MD   150 mcg at 06/12/22 0615   pantoprazole (PROTONIX) injection 40 mg  40 mg Intravenous Q24H Rodolph Bong, MD   40 mg at 06/11/22 2206   polyethylene glycol (MIRALAX / GLYCOLAX) packet 17 g  17 g Oral Daily Rodolph Bong, MD   17 g at 06/11/22 0941   senna (SENOKOT) tablet 17.2 mg  2 tablet Oral BID Rodolph Bong, MD       sertraline (ZOLOFT) tablet 25 mg  25 mg Oral QPM Rodolph Bong, MD   25 mg at 06/11/22 1914   Current Outpatient Medications  Medication Sig Dispense Refill   atorvastatin (LIPITOR) 20 MG tablet Take 20 mg by mouth at bedtime.     Baclofen 5 MG TABS Take 1 tablet by mouth 3 (three) times daily. 0900/1400/2100     bisacodyl (DULCOLAX) 10 MG suppository Place 1 suppository rectally at bedtime.     carvedilol (COREG) 12.5 MG tablet Take 1 tablet (12.5 mg total) by mouth 2 (two) times daily. 180 tablet 3   divalproex (DEPAKOTE) 250 MG DR tablet Take 250 mg by mouth daily.     DULoxetine (CYMBALTA) 30 MG capsule Take 60 mg by mouth every morning.     furosemide (LASIX) 40 MG tablet Take 40 mg by mouth daily. And additional 40mg  QPM PRN for edema or shortness of breath     gabapentin (NEURONTIN) 300 MG capsule Take 300 mg by mouth at  bedtime.     insulin glargine (LANTUS) 100 UNIT/ML injection Inject 10 Units into the skin at bedtime.     insulin lispro (HUMALOG) 100 UNIT/ML injection Inject 5 Units into the skin 3 (three) times daily  before meals. 0800/1300/1700     latanoprost (XALATAN) 0.005 % ophthalmic solution Place 1 drop into both eyes at bedtime.     levothyroxine (SYNTHROID) 150 MCG tablet Take 150 mcg by mouth daily.     melatonin 5 MG TABS Take 10 mg by mouth at bedtime.     metFORMIN (GLUCOPHAGE) 500 MG tablet Take 500 mg by mouth daily.     polyethylene glycol powder (GLYCOLAX/MIRALAX) 17 GM/SCOOP powder Take 17 g by mouth daily.     sacubitril-valsartan (ENTRESTO) 24-26 MG Take 1 tablet by mouth 2 (two) times daily. 60 tablet 3   senna (SENOKOT) 8.6 MG TABS tablet Take 2 tablets by mouth 2 (two) times daily.     sertraline (ZOLOFT) 25 MG tablet Take 25 mg by mouth daily.     vitamin B-12 (CYANOCOBALAMIN) 500 MCG tablet Take 1 tablet by mouth daily with breakfast.     Zinc Oxide 10 % OINT Apply 1 Application topically in the morning, at noon, and at bedtime. To groin area     acetaminophen (TYLENOL) 325 MG tablet Take 650 mg by mouth every 4 (four) hours as needed.     cyclobenzaprine (FLEXERIL) 5 MG tablet Take 5 mg by mouth 3 (three) times daily. (Patient not taking: Reported on 06/10/2022)     Docusate Sodium 100 MG capsule Take 100 mg by mouth every 12 (twelve) hours as needed for constipation. (Patient not taking: Reported on 06/10/2022)     HYDROcodone-acetaminophen (NORCO/VICODIN) 5-325 MG tablet Take 1-2 tablets by mouth every 4 (four) hours as needed for moderate pain or severe pain. (Patient not taking: Reported on 06/10/2022) 30 tablet 0   Ipratropium-Albuterol (COMBIVENT) 20-100 MCG/ACT AERS respimat Inhale 1 puff into the lungs every 6 (six) hours. (Patient not taking: Reported on 06/10/2022)     metoCLOPramide (REGLAN) 5 MG tablet Take 5 mg by mouth 4 (four) times daily. (Patient not taking: Reported on  06/10/2022)     Multiple Vitamin (MULTIVITAMIN) tablet Take 1 tablet by mouth daily. (Patient not taking: Reported on 06/10/2022)     nystatin cream (MYCOSTATIN) Apply topically. (Patient not taking: Reported on 06/10/2022)       Discharge Medications: Please see discharge summary for a list of discharge medications.  Relevant Imaging Results:  Relevant Lab Results:   Additional Information SS#: [id number]  Shelbie Hutching, RN

## 2022-06-12 NOTE — Progress Notes (Signed)
PROGRESS NOTE    [name]  GUR:[id number] DOB: 1956/05/17 DOA: 06/10/2022 PCP: Almetta Lovely, [facility]   Brief Narrative:  Cynthia Landry is a 66 y.o. female seen in ed with complaints of AMS from [facility] yesterday. Yesterday at 3 pm when family saw patient speech was slurred with repeating same phrase and coughing. While eating. And EMS was called today. This patient is currently enrolled in [facility] outpatient-based Palliative Care. Pt has past medical history ALS, congestive heart failure, depression, diabetes mellitus type 2, hypothyroidism, GERD, hepatitis C, hypertension, peripheral vascular disease.  Assessment & Plan:   Principal Problem:   AMS (altered mental status) Active Problems:   Metabolic acidosis   Dysphagia   Depression   Hypothyroidism   Hypertension, benign   Leukocytosis   Diabetes mellitus without complication (HCC)   COPD (chronic obstructive pulmonary disease) (HCC)   CHF (congestive heart failure) (HCC)   GERD (gastroesophageal reflux disease)   Hypokalemia   UTI (urinary tract infection): pROBABLE   AMS (altered mental status) Altered mental status/acute metabolic encephalopathy likely multifactorial secondary to UTI, in the setting of severe electrolyte abnormalities of hypokalemia, hypomagnesemia. -CT shows ventricle/sulcal enlargement concerning for NPH, otherwise no evidence of acute abnormality; MRI/MRA also non-acute -EEG without seizures. -Patient continued to have episodes of non-interaction this morning where she would stare off into the distance but did interact to threat/scleral contact and pain. Unclear if this is secondary to ongoing infection or behavioral. Family has not noticed this before. -Cultures pending -Place empirically on IV Rocephin.     UTI (urinary tract infection): Likely, POA - Urinalysis negative for leukocytes, positive for nitrite, rare bacteria noted, 0-5 WBC -Urine cultures pending -Patient  presenting with altered mental status/acute encephalopathy in the setting of ALS. -We will treat empirically with IV Rocephin pending culture results.   Hypokalemia -2.2 at intake - currently 5.3 - repeat labs in am   GERD (gastroesophageal reflux disease) -Continue PPI   CHF (congestive heart failure) (HCC) No acute exacerbation Continue to hold diuretics.   -Resume home regimen Coreg.  -Strict I's and O's, daily weights.   COPD (chronic obstructive pulmonary disease) (HCC) Stable no wheezing. SpO2: 99 % O2 Flow Rate (L/min): 2 L/min Currently on 2 L [place].   Diabetes mellitus without complication (HCC) - CBG 150 this morning. -Hemoglobin A1c 9.5 (06/10/2022). -Continue to hold home regimen long-acting insulin.   -SSI/hypoglycemic protocol ongoing.   Hypertension, benign Continue home carvedilol   Hypothyroidism -Continue Synthroid.    Depression - Resume home regimen Cymbalta.  Dysphagia Patient assessed by speech therapy and placed on dysphagia 3 diet. -Follow.   DVT prophylaxis: Lovenox Code Status: DNR Family Communication: Updated sister at bedside.  Status is: inpt  Dispo: The patient is from: Facility              Anticipated d/c is to: same              Anticipated d/c date is: 48-72h              Patient currently NOT medically stable for discharge  Procedures:  None  Antimicrobials:  Ceftriaxone   Subjective: No acute issues/events overnight  Objective: Vitals:   06/12/22 0130 06/12/22 0200 06/12/22 0300 06/12/22 0610  BP: (!) 95/49 (!) 101/50 (!) 94/52 (!) 110/50  Pulse: 71 70 77 74  Resp: 16 17 16 16   Temp:    98.2 F (36.8 C)  TempSrc:    Oral  SpO2: 98%  99% 97% 97%  Weight:      Height:       No intake or output data in the 24 hours ending 06/12/22 0741 Filed Weights   06/10/22 1159  Weight: 88 kg    Examination:  General:  Pleasantly resting in bed, No acute distress. Minimally verbal but appropriate one word  responses HEENT:  Normocephalic atraumatic.  Sclerae nonicteric, noninjected.  Extraocular movements intact bilaterally. Neck:  Without mass or deformity.  Trachea is midline. Lungs:  Clear to auscultate bilaterally without rhonchi, wheeze, or rales. Heart:  Regular rate and rhythm.  Without murmurs, rubs, or gallops. Abdomen:  Soft, nontender, nondistended.  Without guarding or rebound. Extremities: Without cyanosis, clubbing, edema, or obvious deformity. Vascular:  Dorsalis pedis and posterior tibial pulses palpable bilaterally. Skin:  Warm and dry, no erythema, sacral ulceration noted  I have personally reviewed following labs and imaging studies/data  CBC: Recent Labs  Lab 06/10/22 1245 06/11/22 0852  WBC 12.0* 9.1  NEUTROABS 4.9 4.0  HGB 15.0 14.0  HCT 49.4* 46.6*  MCV 91.7 91.7  PLT 306 310   Basic Metabolic Panel: Recent Labs  Lab 06/10/22 1245 06/11/22 0852  NA 142 142  K 2.2* 2.2*  CL 84* 90*  CO2 42* 39*  GLUCOSE 205* 177*  BUN 13 8  CREATININE 0.62 0.42*  CALCIUM 8.8* 8.1*  MG 1.7  1.9 2.4   GFR: Estimated Creatinine Clearance: 68.3 mL/min (A) (by C-G formula based on SCr of 0.42 mg/dL (L)). Liver Function Tests: Recent Labs  Lab 06/10/22 1245 06/11/22 0852  AST 35 34  ALT 21 20  ALKPHOS 51 46  BILITOT 1.3* 1.3*  PROT 8.2* 7.3  ALBUMIN 3.3* 3.0*   No results for input(s): "LIPASE", "AMYLASE" in the last 168 hours. No results for input(s): "AMMONIA" in the last 168 hours. Coagulation Profile: Recent Labs  Lab 06/10/22 1245  INR 1.1   Cardiac Enzymes: Recent Labs  Lab 06/10/22 1245  CKTOTAL 220   BNP (last 3 results) No results for input(s): "PROBNP" in the last 8760 hours. HbA1C: Recent Labs    06/10/22 1943  HGBA1C 9.5*   CBG: Recent Labs  Lab 06/11/22 0623 06/11/22 1143 06/11/22 1751 06/12/22 0607 06/12/22 0718  GLUCAP 150* 215* 143* 118* 122*   Lipid Profile: Recent Labs    06/11/22 0852  CHOL 153  HDL 25*   LDLCALC 100*  TRIG 139  CHOLHDL 6.1   Thyroid Function Tests: No results for input(s): "TSH", "T4TOTAL", "FREET4", "T3FREE", "THYROIDAB" in the last 72 hours. Anemia Panel: No results for input(s): "VITAMINB12", "FOLATE", "FERRITIN", "TIBC", "IRON", "RETICCTPCT" in the last 72 hours. Sepsis Labs: Recent Labs  Lab 06/10/22 1943  LATICACIDVEN 1.1    Recent Results (from the past 240 hour(s))  Culture, blood (Routine X 2) w Reflex to ID Panel     Status: None (Preliminary result)   Collection Time: 06/10/22  8:02 PM   Specimen: BLOOD  Result Value Ref Range Status   Specimen Description BLOOD RIGHT FA  Final   Special Requests   Final    BOTTLES DRAWN AEROBIC AND ANAEROBIC Blood Culture adequate volume   Culture   Final    NO GROWTH < 12 HOURS Performed at [facility], [address] [place], [place] [address]    Report Status PENDING  Incomplete  Culture, blood (Routine X 2) w Reflex to ID Panel     Status: None (Preliminary result)   Collection Time: 06/10/22  8:02 PM   Specimen: BLOOD  Result Value Ref Range Status   Specimen Description BLOOD RIGHT FA  Final   Special Requests   Final    BOTTLES DRAWN AEROBIC AND ANAEROBIC Blood Culture results may not be optimal due to an inadequate volume of blood received in culture bottles   Culture   Final    NO GROWTH < 12 HOURS Performed at [facility], [address] [place], [place] [address]    Report Status PENDING  Incomplete         Radiology Studies: EEG adult  Result Date: 10-Jul-2022 Lora Havens, MD     10-Jul-2022  6:47 PM Patient Name: CYDNI CAPELLO MRN: [id number] Epilepsy Attending: Lora Havens Referring Physician/Provider: Para Skeans, MD Date: 07-10-22 Duration: 21.03 mins Patient history: 66yo F with ams. EEG to evaluate for seizure. Level of alertness: Awake AEDs during EEG study: VPA, GBP Technical aspects: This EEG study was done with scalp electrodes positioned  according to the 10-20 International system of electrode placement. Electrical activity was acquired at a sampling rate of 500Hz  and reviewed with a high frequency filter of 70Hz  and a low frequency filter of 1Hz . EEG data were recorded continuously and digitally stored. Description: The posterior dominant rhythm consists of 7 Hz activity of moderate voltage (25-35 uV) seen predominantly in posterior head regions, symmetric and reactive to eye opening and eye closing. EEG showed continuous generalized 5 to 7 Hz theta slowing. Hyperventilation and photic stimulation were not performed.   ABNORMALITY - Continuous slow, generalized - Background slow IMPRESSION: This study is suggestive of moderate diffuse encephalopathy, nonspecific etiology. No seizures or epileptiform discharges were seen throughout the recording. Lora Havens   ECHOCARDIOGRAM COMPLETE BUBBLE STUDY  Result Date: 07-10-2022    ECHOCARDIOGRAM REPORT   Patient Name:   VIRLIE ZENG Date of Exam: 2022-07-10 Medical Rec #:  [id number]       Height:       60.0 in Accession #:    [id number]      Weight:       194.0 lb Date of Birth:  18-Jan-1956       BSA:          1.843 m Patient Age:    71 years        BP:           133/63 mmHg Patient Gender: F               HR:           76 bpm. Exam Location:  [facility] Procedure: 2D Echo, Cardiac Doppler, Intracardiac Opacification Agent and Saline            Contrast Bubble Study Indications:     Stroke 434.91 / I63.9  History:         Patient has no prior history of Echocardiogram examinations.                  CHF; Risk Factors:Hypertension and Dyslipidemia.  Sonographer:     Sherrie Sport Referring Phys:  [id number] EKTA V PATEL Diagnosing Phys: Nelva Bush MD  Sonographer Comments: Technically difficult study due to poor echo windows, no parasternal window and no subcostal window. IMPRESSIONS  1. Left ventricular ejection fraction, by estimation, is >55%. The left ventricle has normal function. Left ventricular  endocardial border not optimally defined to evaluate regional wall motion.  2. Right ventricular systolic function was not well visualized. The  right ventricular size is not well visualized.  3. The mitral valve was not well visualized. No evidence of mitral valve regurgitation.  4. The aortic valve was not well visualized. Aortic valve regurgitation is not visualized. No aortic stenosis is present.  5. Bubble study is nondiagnostic due to poor acoustic windows. FINDINGS  Left Ventricle: Left ventricular size and wall thickness are not well-assessed due to poor acoustic windows. Left ventricular ejection fraction, by estimation, is >55%. The left ventricle has normal function. Left ventricular endocardial border not optimally defined to evaluate regional wall motion. Definity contrast agent was given IV to delineate the left ventricular endocardial borders. Right Ventricle: The right ventricular size is not well visualized. Right vetricular wall thickness was not well visualized. Right ventricular systolic function was not well visualized. Left Atrium: Left atrial size was not well visualized. Right Atrium: Right atrial size was not well visualized. Pericardium: The pericardium was not well visualized. Mitral Valve: The mitral valve was not well visualized. No evidence of mitral valve regurgitation. Tricuspid Valve: The tricuspid valve is not well visualized. Tricuspid valve regurgitation is trivial. Aortic Valve: The aortic valve was not well visualized. Aortic valve regurgitation is not visualized. No aortic stenosis is present. Aortic valve mean gradient measures 2.0 mmHg. Aortic valve peak gradient measures 3.4 mmHg. Pulmonic Valve: The pulmonic valve was not well visualized. Aorta: The aortic root was not well visualized. IAS/Shunts: The interatrial septum was not well visualized. Agitated saline contrast was given intravenously to evaluate for intracardiac shunting. Bubble study is nondiagnostic due to poor  acoustic windows.   Diastology LV e' medial:    3.70 cm/s LV E/e' medial:  21.6 LV e' lateral:   4.24 cm/s LV E/e' lateral: 18.9  RIGHT VENTRICLE RV Basal diam:  3.30 cm RV S prime:     11.30 cm/s TAPSE (M-mode): 1.0 cm LEFT ATRIUM           Index LA Vol (A2C): 29.0 ml 15.74 ml/m LA Vol (A4C): 41.4 ml 22.47 ml/m  AORTIC VALVE AV Vmax:           91.80 cm/s AV Vmean:          61.300 cm/s AV VTI:            0.139 m AV Peak Grad:      3.4 mmHg AV Mean Grad:      2.0 mmHg LVOT Vmax:         90.10 cm/s LVOT Vmean:        61.000 cm/s LVOT VTI:          0.177 m LVOT/AV VTI ratio: 1.27 MITRAL VALVE                TRICUSPID VALVE MV Area (PHT): 2.70 cm     TR Peak grad:   7.5 mmHg MV Decel Time: 281 msec     TR Vmax:        137.00 cm/s MV E velocity: 80.10 cm/s MV A velocity: 106.00 cm/s  SHUNTS MV E/A ratio:  0.76         Systemic VTI: 0.18 m Nelva Bush MD Electronically signed by Nelva Bush MD Signature Date/Time: 06/11/2022/3:14:28 PM    Final    MR BRAIN WO CONTRAST  Result Date: 06/10/2022 CLINICAL DATA:  Initial evaluation for neuro deficit, stroke suspected. EXAM: MRI HEAD WITHOUT CONTRAST MRA HEAD WITHOUT CONTRAST TECHNIQUE: Multiplanar, multi-echo pulse sequences of the brain and surrounding structures were acquired without intravenous contrast. Angiographic images  of the Circle of Willis were acquired using MRA technique without intravenous contrast. COMPARISON:  CT from earlier the same day. FINDINGS: MRI HEAD FINDINGS Brain: Cerebral volume within normal limits for age. Scattered patchy T2/FLAIR hyperintensity involving the periventricular deep white matter both cerebral hemispheres, most consistent with chronic small vessel ischemic disease, moderately advanced in nature. No evidence for acute or subacute ischemia. Gray-white matter differentiation maintained. No areas of chronic cortical infarction. No acute or chronic intracranial blood products. No mass lesion or midline shift. Diffuse  ventricular prominence out of proportion to cortical sulcation noted. No extra-axial fluid collection. Pituitary gland and suprasellar region within normal limits. Vascular: Major intracranial vascular flow voids are maintained. Skull and upper cervical spine: Craniocervical junction normal. Bone marrow signal intensity within normal limits. No scalp soft tissue abnormality. Sinuses/Orbits: Globes and orbital soft tissues within normal limits. Paranasal sinuses and mastoid air cells are clear. Other: None. MRA HEAD FINDINGS Anterior circulation: Both internal carotid arteries patent to the termini without stenosis. A1 segments patent bilaterally. Normal anterior communicating artery complex. Anterior cerebral arteries widely patent. No M1 stenosis or occlusion. No proximal MCA branch occlusion. Distal MCA branches perfused and symmetric. Posterior circulation: Both V4 segments patent without stenosis. Left PICA patent. Right PICA not well seen. Basilar patent to its distal aspect without stenosis. Superior cerebellar and posterior cerebral arteries patent bilaterally. Anatomic variants: None significant.  No aneurysm. IMPRESSION: MRI HEAD IMPRESSION: 1. No acute intracranial abnormality. 2. Diffuse ventricular prominence out of proportion to cortical sulcation. While this finding may in part be related to underlying cerebral atrophy, a component of normal pressure hydrocephalus could be considered in the correct clinical setting. 3. Moderately advanced chronic microvascular ischemic disease. MRA HEAD IMPRESSION: Negative intracranial MRA. No large vessel occlusion, hemodynamically significant stenosis, or other acute vascular abnormality. Electronically Signed   By: Rise Mu M.D.   On: 06/10/2022 23:03   MR ANGIO HEAD WO CONTRAST  Result Date: 06/10/2022 CLINICAL DATA:  Initial evaluation for neuro deficit, stroke suspected. EXAM: MRI HEAD WITHOUT CONTRAST MRA HEAD WITHOUT CONTRAST TECHNIQUE:  Multiplanar, multi-echo pulse sequences of the brain and surrounding structures were acquired without intravenous contrast. Angiographic images of the Circle of Willis were acquired using MRA technique without intravenous contrast. COMPARISON:  CT from earlier the same day. FINDINGS: MRI HEAD FINDINGS Brain: Cerebral volume within normal limits for age. Scattered patchy T2/FLAIR hyperintensity involving the periventricular deep white matter both cerebral hemispheres, most consistent with chronic small vessel ischemic disease, moderately advanced in nature. No evidence for acute or subacute ischemia. Gray-white matter differentiation maintained. No areas of chronic cortical infarction. No acute or chronic intracranial blood products. No mass lesion or midline shift. Diffuse ventricular prominence out of proportion to cortical sulcation noted. No extra-axial fluid collection. Pituitary gland and suprasellar region within normal limits. Vascular: Major intracranial vascular flow voids are maintained. Skull and upper cervical spine: Craniocervical junction normal. Bone marrow signal intensity within normal limits. No scalp soft tissue abnormality. Sinuses/Orbits: Globes and orbital soft tissues within normal limits. Paranasal sinuses and mastoid air cells are clear. Other: None. MRA HEAD FINDINGS Anterior circulation: Both internal carotid arteries patent to the termini without stenosis. A1 segments patent bilaterally. Normal anterior communicating artery complex. Anterior cerebral arteries widely patent. No M1 stenosis or occlusion. No proximal MCA branch occlusion. Distal MCA branches perfused and symmetric. Posterior circulation: Both V4 segments patent without stenosis. Left PICA patent. Right PICA not well seen. Basilar patent to its distal aspect without  stenosis. Superior cerebellar and posterior cerebral arteries patent bilaterally. Anatomic variants: None significant.  No aneurysm. IMPRESSION: MRI HEAD  IMPRESSION: 1. No acute intracranial abnormality. 2. Diffuse ventricular prominence out of proportion to cortical sulcation. While this finding may in part be related to underlying cerebral atrophy, a component of normal pressure hydrocephalus could be considered in the correct clinical setting. 3. Moderately advanced chronic microvascular ischemic disease. MRA HEAD IMPRESSION: Negative intracranial MRA. No large vessel occlusion, hemodynamically significant stenosis, or other acute vascular abnormality. Electronically Signed   By: Jeannine Boga M.D.   On: 06/10/2022 23:03   CT Angio Chest PE W/Cm &/Or Wo Cm  Result Date: 06/10/2022 CLINICAL DATA:  History of the LS. Bed-bound. New oxygen requirement. Concern pulmonary embolism. EXAM: CT ANGIOGRAPHY CHEST WITH CONTRAST TECHNIQUE: Multidetector CT imaging of the chest was performed using the standard protocol during bolus administration of intravenous contrast. Multiplanar CT image reconstructions and MIPs were obtained to evaluate the vascular anatomy. RADIATION DOSE REDUCTION: This exam was performed according to the departmental dose-optimization program which includes automated exposure control, adjustment of the mA and/or kV according to patient size and/or use of iterative reconstruction technique. CONTRAST:  29mL OMNIPAQUE IOHEXOL 350 MG/ML SOLN COMPARISON:  None Available. FINDINGS: Cardiovascular: No filling defects within the pulmonary arteries to suggest acute pulmonary embolism. Mediastinum/Nodes: No axillary or supraclavicular adenopathy. No mediastinal or hilar adenopathy. No pericardial fluid. Esophagus normal. Lungs/Pleura: Moderate bibasilar atelectasis. No pulmonary infarction. No airspace disease. No pneumothorax. 3 mm nodule in the LEFT upper lobe (image 88/6 Upper Abdomen: Limited view of the liver, kidneys, pancreas are unremarkable. Normal adrenal glands. Musculoskeletal: No aggressive osseous lesion. Review of the MIP images  confirms the above findings. IMPRESSION: 1. No evidence acute pulmonary embolism. 2. No pulmonary infarction.  Moderate bibasilar atelectasis. 3. 3 mm left solid pulmonary nodule within the upper lobe. Per Fleischner Society Guidelines, if patient is low risk for malignancy, no routine follow-up imaging is recommended; if patient is high risk for malignancy, a non-contrast Chest CT at 12 months is optional. If performed and the nodule is stable at 12 months, no further follow-up is recommended. These guidelines do not apply to immunocompromised patients and patients with cancer. Follow up in patients with significant comorbidities as clinically warranted. For lung cancer screening, adhere to Lung-RADS guidelines. Reference: Radiology. 2017; 284(1):228-43. Electronically Signed   By: Suzy Bouchard M.D.   On: 06/10/2022 17:59   DG Chest Port 1 View  Result Date: 06/10/2022 CLINICAL DATA:  Cough and altered mental status EXAM: PORTABLE CHEST 1 VIEW COMPARISON:  04/02/2021, 11/01/2015 FINDINGS: Partially visualized hardware in the left humerus. Low lying right humeral head. Low lung volumes with subsegmental atelectasis or scarring at the left base. Increased patchy airspace disease. Right lung is clear. Enlarged cardiomediastinal silhouette. IMPRESSION: 1. Low lung volumes. Subsegmental atelectasis or scar at the left base. Slight increased left basilar opacity could be due to atelectasis or pneumonia 2. Incompletely visualized right shoulder shows low lying right humeral head, correlate with physical examination with follow-up dedicated right shoulder radiographs if indicated Electronically Signed   By: Donavan Foil M.D.   On: 06/10/2022 15:40   CT HEAD WO CONTRAST  Result Date: 06/10/2022 CLINICAL DATA:  Mental status change, unknown cause EXAM: CT HEAD WITHOUT CONTRAST TECHNIQUE: Contiguous axial images were obtained from the base of the skull through the vertex without intravenous contrast. RADIATION  DOSE REDUCTION: This exam was performed according to the departmental dose-optimization program which includes automated  exposure control, adjustment of the mA and/or kV according to patient size and/or use of iterative reconstruction technique. COMPARISON:  Apr 18, 2019. FINDINGS: Brain: Similar ventriculomegaly out of proportion to the degree of sulcal enlargement. Additionally the corpus callosum angle is acute and there is chronic sulci at the vertex. No evidence of acute large vascular territory infarct, mass lesion, midline shift or extra-axial fluid collection. Mild patchy white matter hypodensities, nonspecific but compatible with chronic microvascular ischemic disease. Vascular: No hyperdense vessel identified. Skull: No acute fracture. Sinuses/Orbits: Clear visualized sinuses. No acute orbital findings. Other: No mastoid effusions. IMPRESSION: 1. 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 2. Otherwise, no evidence of acute abnormality. Electronically Signed   By: Margaretha Sheffield M.D.   On: 06/10/2022 13:23        Scheduled Meds:   stroke: early stages of recovery book   Does not apply Once   atorvastatin  20 mg Oral QHS   baclofen  5 mg Oral TID   bisacodyl  10 mg Rectal QHS   carvedilol  12.5 mg Oral BID   cyanocobalamin  500 mcg Oral Q breakfast   divalproex  250 mg Oral Daily   DULoxetine  60 mg Oral q morning   enoxaparin (LOVENOX) injection  40 mg Subcutaneous Q24H   gabapentin  300 mg Oral QHS   insulin aspart  0-15 Units Subcutaneous TID WC   latanoprost  1 drop Both Eyes QHS   levothyroxine  150 mcg Oral Q0600   pantoprazole (PROTONIX) IV  40 mg Intravenous Q24H   polyethylene glycol  17 g Oral Daily   senna  2 tablet Oral BID   sertraline  25 mg Oral QPM   Continuous Infusions:  sodium chloride 50 mL/hr at 06/12/22 0416   cefTRIAXone (ROCEPHIN)  IV Stopped (06/11/22 0932)      LOS: 2 days   Time spent: 107min  Codey Burling C Maudell Stanbrough, DO [facility]  If 7PM-7AM, please contact night-coverage [email or web address]  06/12/2022, 7:41 AM

## 2022-06-12 NOTE — Evaluation (Addendum)
Occupational Therapy Evaluation Patient Details Name: Cynthia Landry MRN: [id number] DOB: May 27, 1956 Today's Date: 06/12/2022   History of Present Illness Pt is a 66 year old female presenting to the ED with c/o AMS from long term care facility. PMH significant for ALS, congestive heart failure, depression, diabetes mellitus type 2, hypothyroidism, GERD, hepatitis C, hypertension, peripheral vascular disease   Clinical Impression   Chart reviewed, RN cleard pt for participation in OT evaluation. Co tx completed with PT on this date. At baseline, pt reports dependence in all ADL/IADL and remains mostly in bed. TOTAL A +2 required for rolling; TOTAL A required for peri care, UB dressing. Pt unable to lift any extremity against gravity and is dysarthric due to ALS. Educated pt on positioning for feeding to prevent aspiration. Pt is left as received, NAD, all needs met. No acute OT needs identified at this time. Recommend frequent position changes to prevent further skin break down. OT will sign off, please reconsult if there is a change in functional status.    Recommendations for follow up therapy are one component of a multi-disciplinary discharge planning process, led by the attending physician.  Recommendations may be updated based on patient status, additional functional criteria and insurance authorization.   Follow Up Recommendations  Other (comment) (OT at LTC for equipment needs- TIS mwc with pressure reliving cushion if pt can tolerate oob)    Assistance Recommended at Discharge Frequent or constant Supervision/Assistance  Patient can return home with the following Two people to help with walking and/or transfers;Two people to help with bathing/dressing/bathroom    Functional Status Assessment  Patient has had a recent decline in their functional status and demonstrates the ability to make significant improvements in function in a reasonable and predictable amount of time.  Equipment  Recommendations  Other (comment) (per next venue of care- pt may benefit from pressure relieving bed and TIS mwc with pressure relieving cushion)    Recommendations for Other Services       Precautions / Restrictions Precautions Precautions: Fall Restrictions Weight Bearing Restrictions: No Other Position/Activity Restrictions: pt is non amb baseline      Mobility Bed Mobility Overal bed mobility: Needs Assistance Bed Mobility: Rolling Rolling: Total assist, +2 for physical assistance              Transfers                   General transfer comment: pt does not stand at baseline      Balance                                           ADL either performed or assessed with clinical judgement   ADL Overall ADL's : Needs assistance/impaired                                       General ADL Comments: TOTAL A +2 for peri care, UB dressing, Grooming     Vision Patient Visual Report: No change from baseline       Perception     Praxis      Pertinent Vitals/Pain Pain Assessment Pain Assessment: Faces Faces Pain Scale: Hurts little more Pain Location: sacrum Pain Descriptors / Indicators: Discomfort Pain Intervention(s): Limited activity within patient's tolerance, Monitored during  session, Repositioned     Hand Dominance     Extremity/Trunk Assessment Upper Extremity Assessment Upper Extremity Assessment: Generalized weakness (unable to raise any extremity against gravity)   Lower Extremity Assessment Lower Extremity Assessment: Generalized weakness       Communication Communication Communication: Expressive difficulties (dysarthric)   Cognition Arousal/Alertness: Awake/alert Behavior During Therapy: WFL for tasks assessed/performed Overall Cognitive Status: Within Functional Limits for tasks assessed                                       General Comments  pt with painful breakdown noted  in peri area- RN present to assess and address;    Exercises     Shoulder Instructions      Home Living Family/patient expects to be discharged to:: Skilled nursing facility                                 Additional Comments: LTC resident at [facility]      Prior Functioning/Environment Prior Level of Function : Needs assist       Physical Assist : Mobility (physical);ADLs (physical) Mobility (physical): Bed mobility;Transfers ADLs (physical): Feeding;Grooming;Bathing;Dressing;Toileting;IADLs            OT Problem List:        OT Treatment/Interventions:      OT Goals(Current goals can be found in the care plan section)    OT Frequency:      Co-evaluation PT/OT/SLP Co-Evaluation/Treatment: Yes Reason for Co-Treatment: For patient/therapist safety;Complexity of the patient's impairments (multi-system involvement)          AM-PAC OT "6 Clicks" Daily Activity     Outcome Measure Help from another person eating meals?: Total Help from another person taking care of personal grooming?: Total Help from another person toileting, which includes using toliet, bedpan, or urinal?: Total Help from another person bathing (including washing, rinsing, drying)?: Total Help from another person to put on and taking off regular upper body clothing?: Total Help from another person to put on and taking off regular lower body clothing?: Total 6 Click Score: 6   End of Session Nurse Communication: Mobility status  Activity Tolerance: Patient tolerated treatment well Patient left: in bed;with call bell/phone within reach;with bed alarm set;with family/visitor present                   Time: 1110-1133 OT Time Calculation (min): 23 min Charges:  OT General Charges $OT Visit: 1 Visit OT Evaluation $OT Eval Moderate Complexity: 1 Mod  Shanon Payor, OTD OTR/L  06/12/22, 12:02 PM

## 2022-06-12 NOTE — Evaluation (Signed)
Physical Therapy Evaluation Patient Details Name: Cynthia Landry MRN: [id number] DOB: [birth year] Today's Date: 06/12/2022  History of Present Illness  Patient is a 66 year old female admitted from [facility] AMS.  EEG report suggestive of moderate diffuse encephalopathy, nonspecific etiology with no seizures. Medical history significant for ALS, congestive heart failure, depression, diabetes mellitus type 2, hypothyroidism, GERD, hepatitis C, hypertension, peripheral vascular disease.  Clinical Impression  Patient requires total care at her LTC facility. She does not get out of bed at the facility and is bed bound at baseline.  The patient was cooperative and pleasant during session, sister at the bedside. The patient required total assistance for rolling to left and right in bed with limited to no active participation. She has extensive weakness throughout and history of ALS. She has skin breakdown noted at peri-area and might benefit from wound care assessment with possible specialty bed. Encouraged for family to advocate for routine repositioning to prevent against further skin breakdown. There are no acute PT needs, however the patient may benefit from PT follow up at LTC facility for specialty equipment needs for skin integrity, positioning, etc.      Recommendations for follow up therapy are one component of a multi-disciplinary discharge planning process, led by the attending physician.  Recommendations may be updated based on patient status, additional functional criteria and insurance authorization.  Follow Up Recommendations Other (comment) (Long term care with PT follow up for equipment needs (specialty bed or wheelchair, etc.)) Can patient physically be transported by private vehicle: No    Assistance Recommended at Discharge Frequent or constant Supervision/Assistance  Patient can return home with the following       Equipment Recommendations  (to be determined at LTC  facility)  Recommendations for Other Services       Functional Status Assessment Patient has not had a recent decline in their functional status     Precautions / Restrictions Precautions Precautions: Fall Restrictions Weight Bearing Restrictions: No Other Position/Activity Restrictions: pt is non amb baseline      Mobility  Bed Mobility Overal bed mobility: Needs Assistance Bed Mobility: Rolling Rolling: Total assist, +2 for physical assistance         General bed mobility comments: rolling in bed to change soiled brief. patient unable to actively participate with bed mobility efforts    Transfers                        Ambulation/Gait                  Stairs            Wheelchair Mobility    Modified Rankin (Stroke Patients Only)       Balance                                             Pertinent Vitals/Pain Pain Assessment Pain Assessment: Faces Faces Pain Scale: Hurts little more Pain Location: sacrum Pain Descriptors / Indicators: Discomfort Pain Intervention(s): Limited activity within patient's tolerance    Home Living Family/patient expects to be discharged to:: Skilled nursing facility                   Additional Comments: LTC resident at [facility]    Prior Function Prior Level of Function : Needs assist  Physical Assist : Mobility (physical);ADLs (physical) Mobility (physical): Bed mobility ADLs (physical): Feeding;Grooming;Bathing;Dressing;Toileting;IADLs Mobility Comments: total care at baseline. bedbound and does not get OOB even with a lift at the facility. does not get PT/OT/SLP ADLs Comments: total care required     Hand Dominance        Extremity/Trunk Assessment   Upper Extremity Assessment Upper Extremity Assessment: Generalized weakness (significant weakness throughout)    Lower Extremity Assessment Lower Extremity Assessment:  (significant weakness  throughout. trace movement noted bilaterally in quad. otherwise no active movement noted. ankles are plantarflexed and unable to achieve neutral with ROM. increased tone noted throughout)       Communication   Communication: Expressive difficulties (dysarthric, low tone)  Cognition Arousal/Alertness: Awake/alert Behavior During Therapy: WFL for tasks assessed/performed Overall Cognitive Status: Within Functional Limits for tasks assessed                                          General Comments General comments (skin integrity, edema, etc.): noted skin breakdown in peri-area. patient may benefit from wound assessment and evaluation for specialty bed    Exercises     Assessment/Plan    PT Assessment Patient needs continued PT services  PT Problem List Decreased strength;Decreased range of motion;Decreased activity tolerance;Decreased balance;Decreased mobility;Decreased coordination       PT Treatment Interventions      PT Goals (Current goals can be found in the Care Plan section)  Acute Rehab PT Goals PT Goal Formulation: All assessment and education complete, DC therapy    Frequency       Co-evaluation PT/OT/SLP Co-Evaluation/Treatment: Yes Reason for Co-Treatment: For patient/therapist safety;Complexity of the patient's impairments (multi-system involvement) PT goals addressed during session: Mobility/safety with mobility         AM-PAC PT "6 Clicks" Mobility  Outcome Measure Help needed turning from your back to your side while in a flat bed without using bedrails?: Total Help needed moving from lying on your back to sitting on the side of a flat bed without using bedrails?: Total Help needed moving to and from a bed to a chair (including a wheelchair)?: Total Help needed standing up from a chair using your arms (e.g., wheelchair or bedside chair)?: Total Help needed to walk in hospital room?: Total Help needed climbing 3-5 steps with a railing?  : Total 6 Click Score: 6    End of Session   Activity Tolerance: Patient tolerated treatment well Patient left: in bed;with bed alarm set;with family/visitor present Nurse Communication: Mobility status (skin breakdown) PT Visit Diagnosis: Unsteadiness on feet (R26.81);Muscle weakness (generalized) (M62.81);Difficulty in walking, not elsewhere classified (R26.2)    Time: 8502-7741 PT Time Calculation (min) (ACUTE ONLY): 30 min   Charges:   PT Evaluation $PT Eval Moderate Complexity: 1 Mod         Donna Bernard, PT, MPT   Ina Homes 06/12/2022, 1:22 PM

## 2022-06-12 NOTE — ED Notes (Signed)
Pt resting comfortably, meds given as ordered.

## 2022-06-12 NOTE — ED Notes (Signed)
Blood pressure low.  Np F[place] awre, meds ordered.  Nsr on monitor.  Pt arousable.  Iv fluids infusing.  Skin warm and dry.  Oxygen in place.

## 2022-06-12 NOTE — ED Notes (Signed)
Report received from [facility] RN. Patient care assumed. Patient/RN introduction complete. Will continue to monitor. Pt awake without distress noted.

## 2022-06-12 NOTE — ED Notes (Signed)
Pt sleeping. 

## 2022-06-13 DIAGNOSIS — J449 Chronic obstructive pulmonary disease, unspecified: Secondary | ICD-10-CM | POA: Diagnosis not present

## 2022-06-13 DIAGNOSIS — I509 Heart failure, unspecified: Secondary | ICD-10-CM | POA: Diagnosis not present

## 2022-06-13 DIAGNOSIS — F329 Major depressive disorder, single episode, unspecified: Secondary | ICD-10-CM | POA: Diagnosis not present

## 2022-06-13 DIAGNOSIS — R404 Transient alteration of awareness: Secondary | ICD-10-CM | POA: Diagnosis not present

## 2022-06-13 LAB — BASIC METABOLIC PANEL
Anion gap: 8 (ref 5–15)
BUN: <5 mg/dL — ABNORMAL LOW (ref 8–23)
CO2: 33 mmol/L — ABNORMAL HIGH (ref 22–32)
Calcium: 7.9 mg/dL — ABNORMAL LOW (ref 8.9–10.3)
Chloride: 100 mmol/L (ref 98–111)
Creatinine, Ser: 0.36 mg/dL — ABNORMAL LOW (ref 0.44–1.00)
GFR, Estimated: >60 mL/min (ref >60)
Glucose, Bld: 168 mg/dL — ABNORMAL HIGH (ref 70–99)
Potassium: 3.4 mmol/L — ABNORMAL LOW (ref 3.5–5.1)
Sodium: 141 mmol/L (ref 135–145)

## 2022-06-13 LAB — URINE CULTURE: Culture: >=100000 — AB

## 2022-06-13 LAB — GLUCOSE, CAPILLARY
Glucose-Capillary: 163 mg/dL — ABNORMAL HIGH (ref 70–99)
Glucose-Capillary: 159 mg/dL — ABNORMAL HIGH (ref 70–99)
Glucose-Capillary: 165 mg/dL — ABNORMAL HIGH (ref 70–99)

## 2022-06-13 MED ORDER — NITROFURANTOIN MONOHYD MACRO 100 MG PO CAPS: 100.0000 mg | ORAL_CAPSULE | Freq: Two times a day (BID) | ORAL | Status: DC

## 2022-06-13 MED ORDER — NITROFURANTOIN MONOHYD MACRO 100 MG PO CAPS
100.0000 mg | ORAL_CAPSULE | Freq: Two times a day (BID) | ORAL | Status: DC
  Filled 2022-06-13: qty 1

## 2022-06-13 MED ORDER — POTASSIUM CHLORIDE CRYS ER 20 MEQ PO TBCR
40.0000 meq | EXTENDED_RELEASE_TABLET | Freq: Once | ORAL | Status: AC
  Administered 2022-06-13: 40 meq via ORAL
  Filled 2022-06-13: qty 2

## 2022-06-13 NOTE — Progress Notes (Addendum)
PROGRESS NOTE    Cynthia Landry  [id number] DOB: [birth year] DOA: 06/10/2022 PCP: Orvis Brill, [facility]   Brief Narrative:  Cynthia Landry is a 66 y.o. female seen in ed with complaints of AMS from [facility] yesterday. Yesterday at 3 pm when family saw patient speech was slurred with repeating same phrase and coughing. While eating. And EMS was called today. This patient is currently enrolled in [facility] outpatient-based Palliative Care. Pt has past medical history ALS, congestive heart failure, depression, diabetes mellitus type 2, hypothyroidism, GERD, hepatitis C, hypertension, peripheral vascular disease.  Assessment & Plan:   Principal Problem:   AMS (altered mental status) Active Problems:   Metabolic acidosis   Dysphagia   Depression   Hypothyroidism   Hypertension, benign   Leukocytosis   Diabetes mellitus without complication (HCC)   COPD (chronic obstructive pulmonary disease) (HCC)   CHF (congestive heart failure) (HCC)   GERD (gastroesophageal reflux disease)   Hypokalemia   UTI (urinary tract infection): pROBABLE   UTI (urinary tract infection): Ecoli - POA -Cultures preliminary result with E. Coli; appear to be ESBL per sensitivities, will discuss with pharmacy and infectious disease, will initiate Macrobid versus Zosyn pending further discussion and discontinue Rocephin -Patient presenting with altered mental status/acute encephalopathy in the setting of ALS.  AMS (altered mental status) Altered mental status/acute metabolic encephalopathy likely multifactorial secondary to UTI, in the setting of severe electrolyte abnormalities of hypokalemia, hypomagnesemia. -CT shows ventricle/sulcal enlargement concerning for NPH, otherwise no evidence of acute abnormality; MRI/MRA also non-acute -EEG without seizures. -Patient continued to have episodes of non-interaction but appears to be improving with treatment of infection.  Hypokalemia -2.2 at intake -  currently 3.4 - repeat labs in am   GERD (gastroesophageal reflux disease) -Continue PPI   CHF (congestive heart failure) (HCC) No acute exacerbation Continue to hold diuretics.   -Resume home regimen Coreg.  -Strict I's and O's, daily weights.   COPD (chronic obstructive pulmonary disease) (HCC) Stable no wheezing. SpO2: 99 % O2 Flow Rate (L/min): 2 L/min Currently on 2 L .   Diabetes mellitus without complication (HCC) - CBG 150 this morning. -Hemoglobin A1c 9.5 (06/10/2022). -Continue to hold home regimen long-acting insulin.   -SSI/hypoglycemic protocol ongoing.   Hypertension, benign Continue home carvedilol   Hypothyroidism -Continue Synthroid.    Depression - Resume home regimen Cymbalta.  Dysphagia Patient assessed by speech therapy and placed on dysphagia 3 diet. -Follow.   DVT prophylaxis: Lovenox Code Status: DNR Family Communication: Updated sister at bedside.  Status is: inpt  Dispo: The patient is from: Facility              Anticipated d/c is to: same              Anticipated d/c date is: 48-72h              Patient currently NOT medically stable for discharge  Procedures:  None  Antimicrobials:  Ceftriaxone   Subjective: No acute issues/events overnight  Objective: Vitals:   06/12/22 2225 06/12/22 2328 06/13/22 0200 06/13/22 0435  BP: 134/66 128/72  (!) 92/50  Pulse: 69 71  71  Resp:  (!) 22 16 18   Temp:  98.5 F (36.9 C)  98 F (36.7 C)  TempSrc:  Oral    SpO2:  100%  100%  Weight:    93.7 kg  Height:        Intake/Output Summary (Last 24 hours) at 06/13/2022 [id number] Last data filed  at 06/13/2022 0530 Gross per 24 hour  Intake 3517.21 ml  Output 601 ml  Net 2916.21 ml   Filed Weights   06/10/22 1159 06/13/22 0435  Weight: 88 kg 93.7 kg    Examination:  General:  Pleasantly resting in bed, No acute distress. Minimally verbal but appropriate one word responses HEENT:  Normocephalic atraumatic.  Sclerae nonicteric,  noninjected.  Extraocular movements intact bilaterally. Neck:  Without mass or deformity.  Trachea is midline. Lungs:  Clear to auscultate bilaterally without rhonchi, wheeze, or rales. Heart:  Regular rate and rhythm.  Without murmurs, rubs, or gallops. Abdomen:  Soft, nontender, nondistended.  Without guarding or rebound. Extremities: Without cyanosis, clubbing, edema, or obvious deformity. Vascular:  Dorsalis pedis and posterior tibial pulses palpable bilaterally. Skin:  Warm and dry, no erythema, sacral ulceration noted  I have personally reviewed following labs and imaging studies/data  CBC: Recent Labs  Lab 06/10/22 1245 06/11/22 0852  WBC 12.0* 9.1  NEUTROABS 4.9 4.0  HGB 15.0 14.0  HCT 49.4* 46.6*  MCV 91.7 91.7  PLT 306 [id number]    Basic Metabolic Panel: Recent Labs  Lab 06/10/22 1245 06/11/22 0852 06/12/22 0911 06/13/22 0559  NA 142 142 139 141  K 2.2* 2.2* 5.3* 3.4*  CL 84* 90* 98 100  CO2 42* 39* 33* 33*  GLUCOSE 205* 177* 163* 168*  BUN 13 8 <5* <5*  CREATININE 0.62 0.42* 0.36* 0.36*  CALCIUM 8.8* 8.1* 7.5* 7.9*  MG 1.7  1.9 2.4  --   --     GFR: Estimated Creatinine Clearance: 70.8 mL/min (A) (by C-G formula based on SCr of 0.36 mg/dL (L)). Liver Function Tests: Recent Labs  Lab 06/10/22 1245 06/11/22 0852  AST 35 34  ALT 21 20  ALKPHOS 51 46  BILITOT 1.3* 1.3*  PROT 8.2* 7.3  ALBUMIN 3.3* 3.0*    No results for input(s): "LIPASE", "AMYLASE" in the last 168 hours. No results for input(s): "AMMONIA" in the last 168 hours. Coagulation Profile: Recent Labs  Lab 06/10/22 1245  INR 1.1    Cardiac Enzymes: Recent Labs  Lab 06/10/22 1245  CKTOTAL 220    BNP (last 3 results) No results for input(s): "PROBNP" in the last 8760 hours. HbA1C: Recent Labs    06/10/22 1943  HGBA1C 9.5*    CBG: Recent Labs  Lab 06/12/22 0718 06/12/22 0805 06/12/22 1155 06/12/22 1653 06/13/22 0618  GLUCAP 122* 118* 174* 157* 163*    Lipid  Profile: Recent Labs    06/11/22 0852  CHOL 153  HDL 25*  LDLCALC 100*  TRIG 139  CHOLHDL 6.1    Thyroid Function Tests: No results for input(s): "TSH", "T4TOTAL", "FREET4", "T3FREE", "THYROIDAB" in the last 72 hours. Anemia Panel: No results for input(s): "VITAMINB12", "FOLATE", "FERRITIN", "TIBC", "IRON", "RETICCTPCT" in the last 72 hours. Sepsis Labs: Recent Labs  Lab 06/10/22 1943  LATICACIDVEN 1.1     Recent Results (from the past 240 hour(s))  Urine Culture     Status: Abnormal (Preliminary result)   Collection Time: 06/10/22  7:43 PM   Specimen: Urine, Catheterized  Result Value Ref Range Status   Specimen Description   Final    URINE, CATHETERIZED Performed at [facility], [address] [place], [place] [address]    Special Requests   Final    NONE Performed at [facility], [address] [place], [place] [address]    Culture (A)  Final    >=100,000 COLONIES/mL ESCHERICHIA COLI SUSCEPTIBILITIES  TO FOLLOW Performed at [facility], [address], [place], [place] [address]    Report Status PENDING  Incomplete  Culture, blood (Routine X 2) w Reflex to ID Panel     Status: None (Preliminary result)   Collection Time: 06/10/22  8:02 PM   Specimen: BLOOD  Result Value Ref Range Status   Specimen Description BLOOD RIGHT FA  Final   Special Requests   Final    BOTTLES DRAWN AEROBIC AND ANAEROBIC Blood Culture adequate volume   Culture   Final    NO GROWTH 3 DAYS Performed at [facility], [address], [place], [place] [address]    Report Status PENDING  Incomplete  Culture, blood (Routine X 2) w Reflex to ID Panel     Status: None (Preliminary result)   Collection Time: 06/10/22  8:02 PM   Specimen: BLOOD  Result Value Ref Range Status   Specimen Description BLOOD RIGHT FA  Final   Special Requests   Final    BOTTLES DRAWN AEROBIC AND ANAEROBIC Blood Culture results may not be optimal due to an inadequate volume  of blood received in culture bottles   Culture   Final    NO GROWTH 3 DAYS Performed at [facility], [address] [place], [place] [address]    Report Status PENDING  Incomplete         Radiology Studies: EEG adult  Result Date: 10-Jul-2022 Charlsie Quest, MD     07-10-2022  6:47 PM Patient Name: LISVET RASHEED MRN: [id number] Epilepsy Attending: Charlsie Quest Referring Physician/Provider: Gertha Calkin, MD Date: 07/10/2022 Duration: 21.03 mins Patient history: 66yo F with ams. EEG to evaluate for seizure. Level of alertness: Awake AEDs during EEG study: VPA, GBP Technical aspects: This EEG study was done with scalp electrodes positioned according to the 10-20 International system of electrode placement. Electrical activity was acquired at a sampling rate of 500Hz  and reviewed with a high frequency filter of 70Hz  and a low frequency filter of 1Hz . EEG data were recorded continuously and digitally stored. Description: The posterior dominant rhythm consists of 7 Hz activity of moderate voltage (25-35 uV) seen predominantly in posterior head regions, symmetric and reactive to eye opening and eye closing. EEG showed continuous generalized 5 to 7 Hz theta slowing. Hyperventilation and photic stimulation were not performed.   ABNORMALITY - Continuous slow, generalized - Background slow IMPRESSION: This study is suggestive of moderate diffuse encephalopathy, nonspecific etiology. No seizures or epileptiform discharges were seen throughout the recording.   ECHOCARDIOGRAM COMPLETE BUBBLE STUDY  Result Date: 07-10-2022    ECHOCARDIOGRAM REPORT   Patient Name:   LATRISHA COIRO Date of Exam: 07/10/2022 Medical Rec #:  06/13/2022       Height:       60.0 in Accession #:    Luciano Cutter      Weight:       194.0 lb Date of Birth:  [birth year]       BSA:          1.843 m Patient Age:    65 years        BP:           133/63 mmHg Patient Gender: F               HR:           76 bpm.  Exam Location:  [facility] Procedure: 2D Echo, Cardiac Doppler, Intracardiac  Opacification Agent and Saline            Contrast Bubble Study Indications:     Stroke 434.91 / I63.9  History:         Patient has no prior history of Echocardiogram examinations.                  CHF; Risk Factors:Hypertension and Dyslipidemia.  Sonographer:     Sherrie Sport Referring Phys:  [id number] EKTA V PATEL Diagnosing Phys: Nelva Bush MD  Sonographer Comments: Technically difficult study due to poor echo windows, no parasternal window and no subcostal window. IMPRESSIONS  1. Left ventricular ejection fraction, by estimation, is >55%. The left ventricle has normal function. Left ventricular endocardial border not optimally defined to evaluate regional wall motion.  2. Right ventricular systolic function was not well visualized. The right ventricular size is not well visualized.  3. The mitral valve was not well visualized. No evidence of mitral valve regurgitation.  4. The aortic valve was not well visualized. Aortic valve regurgitation is not visualized. No aortic stenosis is present.  5. Bubble study is nondiagnostic due to poor acoustic windows. FINDINGS  Left Ventricle: Left ventricular size and wall thickness are not well-assessed due to poor acoustic windows. Left ventricular ejection fraction, by estimation, is >55%. The left ventricle has normal function. Left ventricular endocardial border not optimally defined to evaluate regional wall motion. Definity contrast agent was given IV to delineate the left ventricular endocardial borders. Right Ventricle: The right ventricular size is not well visualized. Right vetricular wall thickness was not well visualized. Right ventricular systolic function was not well visualized. Left Atrium: Left atrial size was not well visualized. Right Atrium: Right atrial size was not well visualized. Pericardium: The pericardium was not well visualized. Mitral Valve: The mitral valve was not well  visualized. No evidence of mitral valve regurgitation. Tricuspid Valve: The tricuspid valve is not well visualized. Tricuspid valve regurgitation is trivial. Aortic Valve: The aortic valve was not well visualized. Aortic valve regurgitation is not visualized. No aortic stenosis is present. Aortic valve mean gradient measures 2.0 mmHg. Aortic valve peak gradient measures 3.4 mmHg. Pulmonic Valve: The pulmonic valve was not well visualized. Aorta: The aortic root was not well visualized. IAS/Shunts: The interatrial septum was not well visualized. Agitated saline contrast was given intravenously to evaluate for intracardiac shunting. Bubble study is nondiagnostic due to poor acoustic windows.   Diastology LV e' medial:    3.70 cm/s LV E/e' medial:  21.6 LV e' lateral:   4.24 cm/s LV E/e' lateral: 18.9  RIGHT VENTRICLE RV Basal diam:  3.30 cm RV S prime:     11.30 cm/s TAPSE (M-mode): 1.0 cm LEFT ATRIUM           Index LA Vol (A2C): 29.0 ml 15.74 ml/m LA Vol (A4C): 41.4 ml 22.47 ml/m  AORTIC VALVE AV Vmax:           91.80 cm/s AV Vmean:          61.300 cm/s AV VTI:            0.139 m AV Peak Grad:      3.4 mmHg AV Mean Grad:      2.0 mmHg LVOT Vmax:         90.10 cm/s LVOT Vmean:        61.000 cm/s LVOT VTI:          0.177 m LVOT/AV VTI ratio: 1.27 MITRAL VALVE  TRICUSPID VALVE MV Area (PHT): 2.70 cm     TR Peak grad:   7.5 mmHg MV Decel Time: 281 msec     TR Vmax:        137.00 cm/s MV E velocity: 80.10 cm/s MV A velocity: 106.00 cm/s  SHUNTS MV E/A ratio:  0.76         Systemic VTI: 0.18 m Yvonne Kendall MD Electronically signed by Yvonne Kendall MD Signature Date/Time: 06/11/2022/3:14:28 PM    Final         Scheduled Meds:   stroke: early stages of recovery book   Does not apply Once   atorvastatin  20 mg Oral QHS   baclofen  5 mg Oral TID   bisacodyl  10 mg Rectal QHS   carvedilol  12.5 mg Oral BID   cyanocobalamin  500 mcg Oral Q breakfast   divalproex  250 mg Oral Daily   DULoxetine   60 mg Oral q morning   enoxaparin (LOVENOX) injection  40 mg Subcutaneous Q24H   gabapentin  300 mg Oral QHS   insulin aspart  0-15 Units Subcutaneous TID WC   latanoprost  1 drop Both Eyes QHS   levothyroxine  150 mcg Oral Q0600   pantoprazole (PROTONIX) IV  40 mg Intravenous Q24H   polyethylene glycol  17 g Oral Daily   senna  2 tablet Oral BID   sertraline  25 mg Oral QPM   Continuous Infusions:  sodium chloride 50 mL/hr at 06/13/22 0530   cefTRIAXone (ROCEPHIN)  IV Stopped (06/12/22 1025)     LOS: 3 days   Time spent:  Azucena Fallen, DO [facility]  If 7PM-7AM, please contact night-coverage [email or web address]  06/13/2022, 8:04 AM

## 2022-06-13 NOTE — Progress Notes (Addendum)
Nutrition Brief Note  RD consulted for assessment of nutrition status.   Wt Readings from Last 15 Encounters:  06/13/22 93.7 kg  03/11/22 88 kg  02/01/22 86.5 kg  11/30/21 83.9 kg  09/24/21 76.7 kg  07/16/21 76.4 kg  05/28/21 76.4 kg  04/04/21 72.9 kg  03/14/21 73.1 kg  12/15/20 73.1 kg  08/09/20 82.5 kg  04/05/20 80.3 kg  12/29/19 77.1 kg  10/27/19 81.2 kg  09/22/19 80.3 kg   Pt admitted with complaints of AMS. PMH significant for hepatitis C, anxiety, ALS, psychosis, CHF, vitamin D deficiency, PVD, and GERD.  Pt admitted with AMS.  7/25- s/p BSE- dysphagia 3 diet with thin liquids  Reviewed I/O's: +2.9 L x 24 hours and +4.2 L since admission  UOP: 600 ml x 24 hours  Pt from [employer].    Spoke with pt and granddaughter at bedside. Pt reports good appetite. She consumed most of her breakfast and dinner yesterday. Pt granddaughter says she has a very good appetite and often is brought outside food from family members (pt ate a [employer] hamburger and apple pie last night).    Reviewed wt hx; no wt loss noted over the past 6 months.   Nutrition-Focused physical exam completed. Findings are no fat depletion, no muscle depletion, and mild edema.    Medications reviewed and include dulcolax, cyanocobalamin, depakote, senokot, miralax, and 0.9% sodium chloride infusion @ 50 ml/hr.   Lab Results  Component Value Date   HGBA1C 9.5 (H) 06/10/2022   PTA DM medications are 10 units insulin glargine daily and 5 units insulin lispro TID.   Labs reviewed: K: 3.4, CBGS: 157-174 (inpatient orders for glycemic control are 0-15 units insulin aspart TID with meals).    Body mass index is 40.34 kg/m. Patient meets criteria for obesity, class II based on current BMI. Obesity is a complex, chronic medical condition that is optimally managed by a multidisciplinary care team. Weight loss is not an ideal goal for an acute inpatient hospitalization. However, if further work-up for  obesity is warranted, consider outpatient referral to outpatient bariatric service and/or [facility].    Current diet order is dysphagia 3, patient is consuming approximately 100% of meals at this time. Labs and medications reviewed.   No nutrition interventions warranted at this time. If nutrition issues arise, please consult RD.   Levada Schilling, RD, LDN, CDCES Registered Dietitian II Certified Diabetes Care and Education Specialist Please refer to [facility] for RD and/or RD on-call/weekend/after hours pager

## 2022-06-14 ENCOUNTER — Inpatient Hospital Stay: Payer: Medicare Other

## 2022-06-14 DIAGNOSIS — R404 Transient alteration of awareness: Secondary | ICD-10-CM | POA: Diagnosis not present

## 2022-06-14 DIAGNOSIS — F329 Major depressive disorder, single episode, unspecified: Secondary | ICD-10-CM | POA: Diagnosis not present

## 2022-06-14 DIAGNOSIS — J449 Chronic obstructive pulmonary disease, unspecified: Secondary | ICD-10-CM | POA: Diagnosis not present

## 2022-06-14 DIAGNOSIS — I509 Heart failure, unspecified: Secondary | ICD-10-CM | POA: Diagnosis not present

## 2022-06-14 LAB — BASIC METABOLIC PANEL
Anion gap: 7 (ref 5–15)
BUN: <5 mg/dL — ABNORMAL LOW (ref 8–23)
CO2: 29 mmol/L (ref 22–32)
Calcium: 7.8 mg/dL — ABNORMAL LOW (ref 8.9–10.3)
Chloride: 106 mmol/L (ref 98–111)
Creatinine, Ser: <0.3 mg/dL — ABNORMAL LOW (ref 0.44–1.00)
Glucose, Bld: 126 mg/dL — ABNORMAL HIGH (ref 70–99)
Potassium: 4.1 mmol/L (ref 3.5–5.1)
Sodium: 142 mmol/L (ref 135–145)

## 2022-06-14 LAB — CBC
HCT: 41 % (ref 36.0–46.0)
Hemoglobin: 12.4 g/dL (ref 12.0–15.0)
MCH: 28.2 pg (ref 26.0–34.0)
MCHC: 30.2 g/dL (ref 30.0–36.0)
MCV: 93.2 fL (ref 80.0–100.0)
Platelets: 233 10*3/uL (ref 150–400)
RBC: 4.4 MIL/uL (ref 3.87–5.11)
RDW: 12.7 % (ref 11.5–15.5)
WBC: 8.3 10*3/uL (ref 4.0–10.5)
nRBC: 0 % (ref 0.0–0.2)

## 2022-06-14 LAB — GLUCOSE, CAPILLARY
Glucose-Capillary: 107 mg/dL — ABNORMAL HIGH (ref 70–99)
Glucose-Capillary: 125 mg/dL — ABNORMAL HIGH (ref 70–99)
Glucose-Capillary: 137 mg/dL — ABNORMAL HIGH (ref 70–99)
Glucose-Capillary: 139 mg/dL — ABNORMAL HIGH (ref 70–99)
Glucose-Capillary: 147 mg/dL — ABNORMAL HIGH (ref 70–99)

## 2022-06-14 MED ORDER — DIPHENHYDRAMINE HCL 50 MG/ML IJ SOLN
12.5000 mg | Freq: Once | INTRAMUSCULAR | Status: AC
Start: 2022-06-14 — End: 2022-06-14
  Administered 2022-06-14: 12.5 mg via INTRAVENOUS
  Filled 2022-06-14: qty 1

## 2022-06-14 MED ORDER — NITROFURANTOIN MONOHYD MACRO 100 MG PO CAPS: 100.0000 mg | ORAL_CAPSULE | Freq: Two times a day (BID) | ORAL | 0 refills | Status: DC

## 2022-06-14 NOTE — Progress Notes (Signed)
   06/14/22 0205  Charting Type  Charting Type Full Reassessment Changes Noted  Focused Reassessment No Changes Integumentary  Integumentary  Integumentary (WDL) X  Skin Integrity Erythema/redness  Erythema/Redness Location Arm  Erythema/Redness Location Orientation Left;Upper;Lower

## 2022-06-14 NOTE — Discharge Summary (Signed)
Physician Discharge Summary  Cynthia Landry ZOX:[id number] DOB: [birth year] DOA: 06/10/2022  PCP: Almetta Lovely, [facility]  Admit date: 06/10/2022 Discharge date: 06/14/2022  Admitted From: Facility Disposition:  Same  Recommendations for Outpatient Follow-up:  Follow up with PCP in 1-2 weeks Please obtain BMP/CBC in one week  Discharge Condition: Stable CODE STATUS: DNR Diet recommendation: Regular diet as tolerated  Brief/Interim Summary: Cynthia Landry is a 66 y.o. female seen in ed with complaints of AMS from [facility] yesterday. Yesterday at 3 pm when family saw patient speech was slurred with repeating same phrase and coughing. While eating. And EMS was called today. This patient is currently enrolled in [facility] outpatient-based Palliative Care. Pt has past medical history ALS, congestive heart failure, depression, diabetes mellitus type 2, hypothyroidism, GERD, hepatitis C, hypertension, peripheral vascular disease.  Patient admitted from facility as above for worsening mental status found to have UTI secondary to E. coli, ESBL per sensitivities, tolerating Macrobid with improving symptoms at this point otherwise stable and agreeable for discharge back to facility to complete antibiotic course.  Patient's altered mental status appears to be resolving with treatment of UTI as well.  Patient continues to have transient episodes of unresponsiveness where she does not respond to voice, but she does respond to tactile stimuli and pain appropriately.  Of note patient's imaging and EEG were unremarkable for any acute findings. Unclear if this is behavioral in nature and will need to be evaluated in the future if this continues without overt organic cause.  Otherwise patient's labs and other chronic comorbid conditions as below are stable at baseline.  Stable to discharge back to facility.   Discharge Diagnoses:  Principal Problem:   AMS (altered mental status) Active Problems:    Metabolic acidosis   Dysphagia   Depression   Hypothyroidism   Hypertension, benign   Leukocytosis   Diabetes mellitus without complication (HCC)   COPD (chronic obstructive pulmonary disease) (HCC)   CHF (congestive heart failure) (HCC)   GERD (gastroesophageal reflux disease)   Hypokalemia   UTI (urinary tract infection): pROBABLE    Discharge Instructions  Discharge Instructions     Discharge patient   Complete by: As directed    Discharge disposition: 03-Skilled Nursing Facility   Discharge patient date: 06/14/2022      Allergies as of 06/14/2022       Reactions   Aspirin Other (See Comments)   dizziness   Hctz [hydrochlorothiazide] Other (See Comments)   cramping   Lisinopril Cough        Medication List     STOP taking these medications    cyclobenzaprine 5 MG tablet Commonly known as: FLEXERIL   Docusate Sodium 100 MG capsule   HYDROcodone-acetaminophen 5-325 MG tablet Commonly known as: NORCO/VICODIN   Ipratropium-Albuterol 20-100 MCG/ACT Aers respimat Commonly known as: COMBIVENT   metoCLOPramide 5 MG tablet Commonly known as: REGLAN   multivitamin tablet   nystatin cream Commonly known as: MYCOSTATIN       TAKE these medications    acetaminophen 325 MG tablet Commonly known as: TYLENOL Take 650 mg by mouth every 4 (four) hours as needed.   atorvastatin 20 MG tablet Commonly known as: LIPITOR Take 20 mg by mouth at bedtime.   Baclofen 5 MG Tabs Take 1 tablet by mouth 3 (three) times daily. 0900/1400/2100   bisacodyl 10 MG suppository Commonly known as: DULCOLAX Place 1 suppository rectally at bedtime.   carvedilol 12.5 MG tablet Commonly known as: COREG Take 1  tablet (12.5 mg total) by mouth 2 (two) times daily.   divalproex 250 MG DR tablet Commonly known as: DEPAKOTE Take 250 mg by mouth daily.   DULoxetine 30 MG capsule Commonly known as: CYMBALTA Take 60 mg by mouth every morning.   furosemide 40 MG  tablet Commonly known as: LASIX Take 40 mg by mouth daily. And additional  QPM PRN for edema or shortness of breath   gabapentin 300 MG capsule Commonly known as: NEURONTIN Take 300 mg by mouth at bedtime.   insulin glargine 100 UNIT/ML injection Commonly known as: LANTUS Inject 10 Units into the skin at bedtime.   insulin lispro 100 UNIT/ML injection Commonly known as: HUMALOG Inject 5 Units into the skin 3 (three) times daily before meals. 0800/1300/1700   latanoprost 0.005 % ophthalmic solution Commonly known as: XALATAN Place 1 drop into both eyes at bedtime.   levothyroxine 150 MCG tablet Commonly known as: SYNTHROID Take 150 mcg by mouth daily.   melatonin 5 MG Tabs Take 10 mg by mouth at bedtime.   metFORMIN 500 MG tablet Commonly known as: GLUCOPHAGE Take 500 mg by mouth daily.   nitrofurantoin (macrocrystal-monohydrate) 100 MG capsule Commonly known as: MACROBID Take 1 capsule (100 mg total) by mouth every 12 (twelve) hours.   polyethylene glycol powder 17 GM/SCOOP powder Commonly known as: GLYCOLAX/MIRALAX Take 17 g by mouth daily.   sacubitril-valsartan 24-26 MG Commonly known as: ENTRESTO Take 1 tablet by mouth 2 (two) times daily.   senna 8.6 MG Tabs tablet Commonly known as: SENOKOT Take 2 tablets by mouth 2 (two) times daily.   sertraline 25 MG tablet Commonly known as: ZOLOFT Take 25 mg by mouth daily.   vitamin B-12 500 MCG tablet Commonly known as: CYANOCOBALAMIN Take 1 tablet by mouth daily with breakfast.   Zinc Oxide 10 % Oint Apply 1 Application topically in the morning, at noon, and at bedtime. To groin area        Allergies  Allergen Reactions   Aspirin Other (See Comments)    dizziness   Hctz [Hydrochlorothiazide] Other (See Comments)    cramping   Lisinopril Cough    Consultations: None  Procedures/Studies: US Venous Img Upper Uni Left (DVT)  Result Date: 06/14/2022 CLINICAL DATA:  Left arm edema. EXAM: LEFT  UPPER EXTREMITY VENOUS DOPPLER ULTRASOUND TECHNIQUE: Gray-scale sonography with graded compression, as well as color Doppler and duplex ultrasound were performed to evaluate the upper extremity deep venous system from the level of the subclavian vein and including the jugular, axillary, basilic, radial, ulnar and upper cephalic vein. Spectral Doppler was utilized to evaluate flow at rest and with distal augmentation maneuvers. COMPARISON:  None Available. FINDINGS: Contralateral Subclavian Vein: Respiratory phasicity is normal and symmetric with the symptomatic side. No evidence of thrombus. Normal compressibility. Internal Jugular Vein: No evidence of thrombus. Normal compressibility, respiratory phasicity and response to augmentation. Subclavian Vein: No evidence of thrombus. Normal compressibility, respiratory phasicity and response to augmentation. Axillary Vein: No evidence of thrombus. Normal compressibility, respiratory phasicity and response to augmentation. Cephalic Vein: No evidence of thrombus. Normal compressibility, respiratory phasicity and response to augmentation. Basilic Vein: No evidence of thrombus. Normal compressibility, respiratory phasicity and response to augmentation. Brachial Veins: No evidence of thrombus. Normal compressibility, respiratory phasicity and response to augmentation. Radial Veins: No evidence of thrombus. Normal compressibility, respiratory phasicity and response to augmentation. Ulnar Veins: No evidence of thrombus. Normal compressibility, respiratory phasicity and response to augmentation. Venous Reflux:  None visualized. Other  Findings:  None visualized. IMPRESSION: No evidence of DVT within the LEFT upper extremity. Electronically Signed   By: Aram Candela M.D.   On: 06/14/2022 04:34   EEG adult  Result Date: 06/11/2022 Charlsie Quest, MD     06/11/2022  6:47 PM Patient Name: TAMEIKA HECKMANN MRN: [id number] Epilepsy Attending: Charlsie Quest Referring  Physician/Provider: Gertha Calkin, MD Date: 06/11/2022 Duration: 21.03 mins Patient history: 66yo F with ams. EEG to evaluate for seizure. Level of alertness: Awake AEDs during EEG study: VPA, GBP Technical aspects: This EEG study was done with scalp electrodes positioned according to the 10-20 International system of electrode placement. Electrical activity was acquired at a sampling rate of  and reviewed with a high frequency filter of  and a low frequency filter of . EEG data were recorded continuously and digitally stored. Description: The posterior dominant rhythm consists of 7 Hz activity of moderate voltage (25-35 uV) seen predominantly in posterior head regions, symmetric and reactive to eye opening and eye closing. EEG showed continuous generalized 5 to 7 Hz theta slowing. Hyperventilation and photic stimulation were not performed.   ABNORMALITY - Continuous slow, generalized - Background slow IMPRESSION: This study is suggestive of moderate diffuse encephalopathy, nonspecific etiology. No seizures or epileptiform discharges were seen throughout the recording. Charlsie Quest   ECHOCARDIOGRAM COMPLETE BUBBLE STUDY  Result Date: 06/11/2022    ECHOCARDIOGRAM REPORT   Patient Name:   SHIANNA BALLY Date of Exam: 06/11/2022 Medical Rec #:  [id number]       Height:       60.0 in Accession #:    [id number]      Weight:       194.0 lb Date of Birth:  Jun 05, 1956       BSA:          1.843 m Patient Age:    65 years        BP:           133/63 mmHg Patient Gender: F               HR:           76 bpm. Exam Location:  [facility] Procedure: 2D Echo, Cardiac Doppler, Intracardiac Opacification Agent and Saline            Contrast Bubble Study Indications:     Stroke 434.91 / I63.9  History:         Patient has no prior history of Echocardiogram examinations.                  CHF; Risk Factors:Hypertension and Dyslipidemia.  Sonographer:     Cristela Blue Referring Phys:  [id number] EKTA V PATEL Diagnosing Phys:  Yvonne Kendall MD  Sonographer Comments: Technically difficult study due to poor echo windows, no parasternal window and no subcostal window. IMPRESSIONS  1. Left ventricular ejection fraction, by estimation, is >55%. The left ventricle has normal function. Left ventricular endocardial border not optimally defined to evaluate regional wall motion.  2. Right ventricular systolic function was not well visualized. The right ventricular size is not well visualized.  3. The mitral valve was not well visualized. No evidence of mitral valve regurgitation.  4. The aortic valve was not well visualized. Aortic valve regurgitation is not visualized. No aortic stenosis is present.  5. Bubble study is nondiagnostic due to poor acoustic windows. FINDINGS  Left Ventricle: Left ventricular size and wall thickness are not well-assessed due to  poor acoustic windows. Left ventricular ejection fraction, by estimation, is >55%. The left ventricle has normal function. Left ventricular endocardial border not optimally defined to evaluate regional wall motion. Definity contrast agent was given IV to delineate the left ventricular endocardial borders. Right Ventricle: The right ventricular size is not well visualized. Right vetricular wall thickness was not well visualized. Right ventricular systolic function was not well visualized. Left Atrium: Left atrial size was not well visualized. Right Atrium: Right atrial size was not well visualized. Pericardium: The pericardium was not well visualized. Mitral Valve: The mitral valve was not well visualized. No evidence of mitral valve regurgitation. Tricuspid Valve: The tricuspid valve is not well visualized. Tricuspid valve regurgitation is trivial. Aortic Valve: The aortic valve was not well visualized. Aortic valve regurgitation is not visualized. No aortic stenosis is present. Aortic valve mean gradient measures 2.0 mmHg. Aortic valve peak gradient measures 3.4 mmHg. Pulmonic Valve: The  pulmonic valve was not well visualized. Aorta: The aortic root was not well visualized. IAS/Shunts: The interatrial septum was not well visualized. Agitated saline contrast was given intravenously to evaluate for intracardiac shunting. Bubble study is nondiagnostic due to poor acoustic windows.   Diastology LV e' medial:    3.70 cm/s LV E/e' medial:  21.6 LV e' lateral:   4.24 cm/s LV E/e' lateral: 18.9  RIGHT VENTRICLE RV Basal diam:  3.30 cm RV S prime:     11.30 cm/s TAPSE (M-mode): 1.0 cm LEFT ATRIUM           Index LA Vol (A2C): 29.0 ml 15.74 ml/m LA Vol (A4C): 41.4 ml 22.47 ml/m  AORTIC VALVE AV Vmax:           91.80 cm/s AV Vmean:          61.300 cm/s AV VTI:            0.139 m AV Peak Grad:      3.4 mmHg AV Mean Grad:      2.0 mmHg LVOT Vmax:         90.10 cm/s LVOT Vmean:        61.000 cm/s LVOT VTI:          0.177 m LVOT/AV VTI ratio: 1.27 MITRAL VALVE                TRICUSPID VALVE MV Area (PHT): 2.70 cm     TR Peak grad:   7.5 mmHg MV Decel Time: 281 msec     TR Vmax:        137.00 cm/s MV E velocity: 80.10 cm/s MV A velocity: 106.00 cm/s  SHUNTS MV E/A ratio:  0.76         Systemic VTI: 0.18 m Yvonne Kendall MD Electronically signed by Yvonne Kendall MD Signature Date/Time: 06/11/2022/3:14:28 PM    Final    MR BRAIN WO CONTRAST  Result Date: 06/10/2022 CLINICAL DATA:  Initial evaluation for neuro deficit, stroke suspected. EXAM: MRI HEAD WITHOUT CONTRAST MRA HEAD WITHOUT CONTRAST TECHNIQUE: Multiplanar, multi-echo pulse sequences of the brain and surrounding structures were acquired without intravenous contrast. Angiographic images of the Circle of Willis were acquired using MRA technique without intravenous contrast. COMPARISON:  CT from earlier the same day. FINDINGS: MRI HEAD FINDINGS Brain: Cerebral volume within normal limits for age. Scattered patchy T2/FLAIR hyperintensity involving the periventricular deep white matter both cerebral hemispheres, most consistent with chronic small vessel  ischemic disease, moderately advanced in nature. No evidence for acute or subacute ischemia. Gray-white matter  differentiation maintained. No areas of chronic cortical infarction. No acute or chronic intracranial blood products. No mass lesion or midline shift. Diffuse ventricular prominence out of proportion to cortical sulcation noted. No extra-axial fluid collection. Pituitary gland and suprasellar region within normal limits. Vascular: Major intracranial vascular flow voids are maintained. Skull and upper cervical spine: Craniocervical junction normal. Bone marrow signal intensity within normal limits. No scalp soft tissue abnormality. Sinuses/Orbits: Globes and orbital soft tissues within normal limits. Paranasal sinuses and mastoid air cells are clear. Other: None. MRA HEAD FINDINGS Anterior circulation: Both internal carotid arteries patent to the termini without stenosis. A1 segments patent bilaterally. Normal anterior communicating artery complex. Anterior cerebral arteries widely patent. No M1 stenosis or occlusion. No proximal MCA branch occlusion. Distal MCA branches perfused and symmetric. Posterior circulation: Both V4 segments patent without stenosis. Left PICA patent. Right PICA not well seen. Basilar patent to its distal aspect without stenosis. Superior cerebellar and posterior cerebral arteries patent bilaterally. Anatomic variants: None significant.  No aneurysm. IMPRESSION: MRI HEAD IMPRESSION: 1. No acute intracranial abnormality. 2. Diffuse ventricular prominence out of proportion to cortical sulcation. While this finding may in part be related to underlying cerebral atrophy, a component of normal pressure hydrocephalus could be considered in the correct clinical setting. 3. Moderately advanced chronic microvascular ischemic disease. MRA HEAD IMPRESSION: Negative intracranial MRA. No large vessel occlusion, hemodynamically significant stenosis, or other acute vascular abnormality.  Electronically Signed   By: Rise Mu M.D.   On: 06/10/2022 23:03   MR ANGIO HEAD WO CONTRAST  Result Date: 06/10/2022 CLINICAL DATA:  Initial evaluation for neuro deficit, stroke suspected. EXAM: MRI HEAD WITHOUT CONTRAST MRA HEAD WITHOUT CONTRAST TECHNIQUE: Multiplanar, multi-echo pulse sequences of the brain and surrounding structures were acquired without intravenous contrast. Angiographic images of the Circle of Willis were acquired using MRA technique without intravenous contrast. COMPARISON:  CT from earlier the same day. FINDINGS: MRI HEAD FINDINGS Brain: Cerebral volume within normal limits for age. Scattered patchy T2/FLAIR hyperintensity involving the periventricular deep white matter both cerebral hemispheres, most consistent with chronic small vessel ischemic disease, moderately advanced in nature. No evidence for acute or subacute ischemia. Gray-white matter differentiation maintained. No areas of chronic cortical infarction. No acute or chronic intracranial blood products. No mass lesion or midline shift. Diffuse ventricular prominence out of proportion to cortical sulcation noted. No extra-axial fluid collection. Pituitary gland and suprasellar region within normal limits. Vascular: Major intracranial vascular flow voids are maintained. Skull and upper cervical spine: Craniocervical junction normal. Bone marrow signal intensity within normal limits. No scalp soft tissue abnormality. Sinuses/Orbits: Globes and orbital soft tissues within normal limits. Paranasal sinuses and mastoid air cells are clear. Other: None. MRA HEAD FINDINGS Anterior circulation: Both internal carotid arteries patent to the termini without stenosis. A1 segments patent bilaterally. Normal anterior communicating artery complex. Anterior cerebral arteries widely patent. No M1 stenosis or occlusion. No proximal MCA branch occlusion. Distal MCA branches perfused and symmetric. Posterior circulation: Both V4 segments  patent without stenosis. Left PICA patent. Right PICA not well seen. Basilar patent to its distal aspect without stenosis. Superior cerebellar and posterior cerebral arteries patent bilaterally. Anatomic variants: None significant.  No aneurysm. IMPRESSION: MRI HEAD IMPRESSION: 1. No acute intracranial abnormality. 2. Diffuse ventricular prominence out of proportion to cortical sulcation. While this finding may in part be related to underlying cerebral atrophy, a component of normal pressure hydrocephalus could be considered in the correct clinical setting. 3. Moderately advanced chronic microvascular ischemic  disease. MRA HEAD IMPRESSION: Negative intracranial MRA. No large vessel occlusion, hemodynamically significant stenosis, or other acute vascular abnormality. Electronically Signed   By: Rise Mu M.D.   On: 06/10/2022 23:03   CT Angio Chest PE W/Cm &/Or Wo Cm  Result Date: 06/10/2022 CLINICAL DATA:  History of the LS. Bed-bound. New oxygen requirement. Concern pulmonary embolism. EXAM: CT ANGIOGRAPHY CHEST WITH CONTRAST TECHNIQUE: Multidetector CT imaging of the chest was performed using the standard protocol during bolus administration of intravenous contrast. Multiplanar CT image reconstructions and MIPs were obtained to evaluate the vascular anatomy. RADIATION DOSE REDUCTION: This exam was performed according to the departmental dose-optimization program which includes automated exposure control, adjustment of the mA and/or kV according to patient size and/or use of iterative reconstruction technique. CONTRAST:  24mL OMNIPAQUE IOHEXOL 350 MG/ML SOLN COMPARISON:  None Available. FINDINGS: Cardiovascular: No filling defects within the pulmonary arteries to suggest acute pulmonary embolism. Mediastinum/Nodes: No axillary or supraclavicular adenopathy. No mediastinal or hilar adenopathy. No pericardial fluid. Esophagus normal. Lungs/Pleura: Moderate bibasilar atelectasis. No pulmonary  infarction. No airspace disease. No pneumothorax. 3 mm nodule in the LEFT upper lobe (image 88/6 Upper Abdomen: Limited view of the liver, kidneys, pancreas are unremarkable. Normal adrenal glands. Musculoskeletal: No aggressive osseous lesion. Review of the MIP images confirms the above findings. IMPRESSION: 1. No evidence acute pulmonary embolism. 2. No pulmonary infarction.  Moderate bibasilar atelectasis. 3. 3 mm left solid pulmonary nodule within the upper lobe. Per Fleischner Society Guidelines, if patient is low risk for malignancy, no routine follow-up imaging is recommended; if patient is high risk for malignancy, a non-contrast Chest CT at 12 months is optional. If performed and the nodule is stable at 12 months, no further follow-up is recommended. These guidelines do not apply to immunocompromised patients and patients with cancer. Follow up in patients with significant comorbidities as clinically warranted. For lung cancer screening, adhere to Lung-RADS guidelines. Reference: Radiology. 2017; 284(1):228-43. Electronically Signed   By: Genevive Bi M.D.   On: 06/10/2022 17:59   DG Chest Port 1 View  Result Date: 06/10/2022 CLINICAL DATA:  Cough and altered mental status EXAM: PORTABLE CHEST 1 VIEW COMPARISON:  04/02/2021, 11/01/2015 FINDINGS: Partially visualized hardware in the left humerus. Low lying right humeral head. Low lung volumes with subsegmental atelectasis or scarring at the left base. Increased patchy airspace disease. Right lung is clear. Enlarged cardiomediastinal silhouette. IMPRESSION: 1. Low lung volumes. Subsegmental atelectasis or scar at the left base. Slight increased left basilar opacity could be due to atelectasis or pneumonia 2. Incompletely visualized right shoulder shows low lying right humeral head, correlate with physical examination with follow-up dedicated right shoulder radiographs if indicated Electronically Signed   By: Jasmine Pang M.D.   On: 06/10/2022 15:40    CT HEAD WO CONTRAST  Result Date: 06/10/2022 CLINICAL DATA:  Mental status change, unknown cause EXAM: CT HEAD WITHOUT CONTRAST TECHNIQUE: Contiguous axial images were obtained from the base of the skull through the vertex without intravenous contrast. RADIATION DOSE REDUCTION: This exam was performed according to the departmental dose-optimization program which includes automated exposure control, adjustment of the mA and/or kV according to patient size and/or use of iterative reconstruction technique. COMPARISON:  Apr 18, 2019. FINDINGS: Brain: Similar ventriculomegaly out of proportion to the degree of sulcal enlargement. Additionally the corpus callosum angle is acute and there is chronic sulci at the vertex. No evidence of acute large vascular territory infarct, mass lesion, midline shift or extra-axial fluid collection. Mild  patchy white matter hypodensities, nonspecific but compatible with chronic microvascular ischemic disease. Vascular: No hyperdense vessel identified. Skull: No acute fracture. Sinuses/Orbits: Clear visualized sinuses. No acute orbital findings. Other: No mastoid effusions. IMPRESSION: 1. 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 2. Otherwise, no evidence of acute abnormality. Electronically Signed   By: Feliberto Harts M.D.   On: 06/10/2022 13:23     Subjective: No acute issues or events overnight   Discharge Exam: Vitals:   06/14/22 0450 06/14/22 0748  BP: (!) 108/51 136/64  Pulse: 66 65  Resp:  16  Temp: 98.7 F (37.1 C) 98.2 F (36.8 C)  SpO2: 100% 100%   Vitals:   06/14/22 0449 06/14/22 0450 06/14/22 0500 06/14/22 0748  BP: (!) 108/51 (!) 108/51  136/64  Pulse: 68 66  65  Resp: 18   16  Temp: 98.7 F (37.1 C) 98.7 F (37.1 C)  98.2 F (36.8 C)  TempSrc: Axillary Oral    SpO2:  100%  100%  Weight:   93.7 kg   Height:        General:  Pleasantly  resting in bed, No acute distress. Minimally verbal but appropriate 1-3 word responses HEENT:  Normocephalic atraumatic.  Sclerae nonicteric, noninjected.  Extraocular movements intact bilaterally. Neck:  Without mass or deformity.  Trachea is midline. Lungs:  Clear to auscultate bilaterally without rhonchi, wheeze, or rales. Heart:  Regular rate and rhythm.  Without murmurs, rubs, or gallops. Abdomen:  Soft, nontender, nondistended.  Without guarding or rebound. Extremities: Without cyanosis, clubbing, edema, or obvious deformity. Vascular:  Dorsalis pedis and posterior tibial pulses palpable bilaterally. Skin:  Warm and dry, no erythema, sacral ulceration noted    The results of significant diagnostics from this hospitalization (including imaging, microbiology, ancillary and laboratory) are listed below for reference.     Microbiology: Recent Results (from the past 240 hour(s))  Urine Culture     Status: Abnormal   Collection Time: 06/10/22  7:43 PM   Specimen: Urine, Catheterized  Result Value Ref Range Status   Specimen Description   Final    URINE, CATHETERIZED Performed at [facility], [address], [place], [place] [address]    Special Requests   Final    NONE Performed at [facility], [address] [place], [place] [address]    Culture (A)  Final    >=100,000 COLONIES/mL ESCHERICHIA COLI Confirmed Extended Spectrum Beta-Lactamase Producer (ESBL).  In bloodstream infections from ESBL organisms, carbapenems are preferred over piperacillin/tazobactam. They are shown to have a lower risk of mortality.    Report Status 06/13/2022 FINAL  Final   Organism ID, Bacteria ESCHERICHIA COLI (A)  Final      Susceptibility   Escherichia coli - MIC*    AMPICILLIN >=32 RESISTANT Resistant     CEFAZOLIN >=64 RESISTANT Resistant     CEFEPIME 16 RESISTANT Resistant     CEFTRIAXONE >=64 RESISTANT Resistant     CIPROFLOXACIN >=4 RESISTANT Resistant     GENTAMICIN  <=1 SENSITIVE Sensitive     IMIPENEM <=0.25 SENSITIVE Sensitive     NITROFURANTOIN <=16 SENSITIVE Sensitive     TRIMETH/SULFA >=320 RESISTANT Resistant     AMPICILLIN/SULBACTAM >=32 RESISTANT Resistant     PIP/TAZO <=4 SENSITIVE Sensitive     * >=100,000 COLONIES/mL ESCHERICHIA COLI  Culture, blood (Routine X 2) w Reflex to ID Panel     Status: None (Preliminary result)  Collection Time: 06/10/22  8:02 PM   Specimen: BLOOD  Result Value Ref Range Status   Specimen Description BLOOD RIGHT FA  Final   Special Requests   Final    BOTTLES DRAWN AEROBIC AND ANAEROBIC Blood Culture adequate volume   Culture   Final    NO GROWTH 4 DAYS Performed at [facility], [address] [place], [place] [address]    Report Status PENDING  Incomplete  Culture, blood (Routine X 2) w Reflex to ID Panel     Status: None (Preliminary result)   Collection Time: 06/10/22  8:02 PM   Specimen: BLOOD  Result Value Ref Range Status   Specimen Description BLOOD RIGHT FA  Final   Special Requests   Final    BOTTLES DRAWN AEROBIC AND ANAEROBIC Blood Culture results may not be optimal due to an inadequate volume of blood received in culture bottles   Culture   Final    NO GROWTH 4 DAYS Performed at [facility], [address] [place], [place] [address]    Report Status PENDING  Incomplete     Labs: BNP (last 3 results) Recent Labs    06/10/22 1245  BNP 18.5   Basic Metabolic Panel: Recent Labs  Lab 06/10/22 1245 06/11/22 0852 06/12/22 0911 06/13/22 0559 06/14/22 0606  NA 142 142 139 141 142  K 2.2* 2.2* 5.3* 3.4* 4.1  CL 84* 90* 98 100 106  CO2 42* 39* 33* 33* 29  GLUCOSE 205* 177* 163* 168* 126*  BUN 13 8 <5* <5* <5*  CREATININE 0.62 0.42* 0.36* 0.36* <0.30*  CALCIUM 8.8* 8.1* 7.5* 7.9* 7.8*  MG 1.7  1.9 2.4  --   --   --    Liver Function Tests: Recent Labs  Lab 06/10/22 1245 06/11/22 0852  AST 35 34  ALT 21 20  ALKPHOS 51 46  BILITOT 1.3* 1.3*  PROT 8.2*  7.3  ALBUMIN 3.3* 3.0*   No results for input(s): "LIPASE", "AMYLASE" in the last 168 hours. No results for input(s): "AMMONIA" in the last 168 hours. CBC: Recent Labs  Lab 06/10/22 1245 06/11/22 0852  WBC 12.0* 9.1  NEUTROABS 4.9 4.0  HGB 15.0 14.0  HCT 49.4* 46.6*  MCV 91.7 91.7  PLT 306 310   Cardiac Enzymes: Recent Labs  Lab 06/10/22 1245  CKTOTAL 220   BNP: Invalid input(s): "POCBNP" CBG: Recent Labs  Lab 06/13/22 1658 06/14/22 0027 06/14/22 0049 06/14/22 0604 06/14/22 0750  GLUCAP 165* 147* 137* 107* 125*   D-Dimer No results for input(s): "DDIMER" in the last 72 hours. Hgb A1c No results for input(s): "HGBA1C" in the last 72 hours. Lipid Profile Recent Labs    06/11/22 0852  CHOL 153  HDL 25*  LDLCALC 100*  TRIG 139  CHOLHDL 6.1   Thyroid function studies No results for input(s): "TSH", "T4TOTAL", "T3FREE", "THYROIDAB" in the last 72 hours.  Invalid input(s): "FREET3" Anemia work up No results for input(s): "VITAMINB12", "FOLATE", "FERRITIN", "TIBC", "IRON", "RETICCTPCT" in the last 72 hours. Urinalysis    Component Value Date/Time   COLORURINE YELLOW (A) 06/10/2022 1943   APPEARANCEUR HAZY (A) 06/10/2022 1943   APPEARANCEUR Hazy 01/12/2013 1713   LABSPEC 1.038 (H) 06/10/2022 1943   LABSPEC 1.010 01/12/2013 1713   PHURINE 6.0 06/10/2022 1943   GLUCOSEU NEGATIVE 06/10/2022 1943   GLUCOSEU Negative 01/12/2013 1713   HGBUR NEGATIVE 06/10/2022 1943   BILIRUBINUR NEGATIVE 06/10/2022 1943   BILIRUBINUR Negative 01/12/2013 1713  KETONESUR 5 (A) 06/10/2022 1943   PROTEINUR NEGATIVE 06/10/2022 1943   NITRITE POSITIVE (A) 06/10/2022 1943   LEUKOCYTESUR NEGATIVE 06/10/2022 1943   LEUKOCYTESUR Negative 01/12/2013 1713   Sepsis Labs Recent Labs  Lab 06/10/22 1245 06/11/22 0852  WBC 12.0* 9.1   Microbiology Recent Results (from the past 240 hour(s))  Urine Culture     Status: Abnormal   Collection Time: 06/10/22  7:43 PM   Specimen:  Urine, Catheterized  Result Value Ref Range Status   Specimen Description   Final    URINE, CATHETERIZED Performed at [facility], [address], [place], [place] [address]    Special Requests   Final    NONE Performed at [facility], [address] [place], [place] [address]    Culture (A)  Final    >=100,000 COLONIES/mL ESCHERICHIA COLI Confirmed Extended Spectrum Beta-Lactamase Producer (ESBL).  In bloodstream infections from ESBL organisms, carbapenems are preferred over piperacillin/tazobactam. They are shown to have a lower risk of mortality.    Report Status 06/13/2022 FINAL  Final   Organism ID, Bacteria ESCHERICHIA COLI (A)  Final      Susceptibility   Escherichia coli - MIC*    AMPICILLIN >=32 RESISTANT Resistant     CEFAZOLIN >=64 RESISTANT Resistant     CEFEPIME 16 RESISTANT Resistant     CEFTRIAXONE >=64 RESISTANT Resistant     CIPROFLOXACIN >=4 RESISTANT Resistant     GENTAMICIN <=1 SENSITIVE Sensitive     IMIPENEM <=0.25 SENSITIVE Sensitive     NITROFURANTOIN <=16 SENSITIVE Sensitive     TRIMETH/SULFA >=320 RESISTANT Resistant     AMPICILLIN/SULBACTAM >=32 RESISTANT Resistant     PIP/TAZO <=4 SENSITIVE Sensitive     * >=100,000 COLONIES/mL ESCHERICHIA COLI  Culture, blood (Routine X 2) w Reflex to ID Panel     Status: None (Preliminary result)   Collection Time: 06/10/22  8:02 PM   Specimen: BLOOD  Result Value Ref Range Status   Specimen Description BLOOD RIGHT FA  Final   Special Requests   Final    BOTTLES DRAWN AEROBIC AND ANAEROBIC Blood Culture adequate volume   Culture   Final    NO GROWTH 4 DAYS Performed at [facility], [address], [place], [place] [address]    Report Status PENDING  Incomplete  Culture, blood (Routine X 2) w Reflex to ID Panel     Status: None (Preliminary result)   Collection Time: 06/10/22  8:02 PM   Specimen: BLOOD  Result Value Ref Range Status   Specimen Description BLOOD RIGHT FA   Final   Special Requests   Final    BOTTLES DRAWN AEROBIC AND ANAEROBIC Blood Culture results may not be optimal due to an inadequate volume of blood received in culture bottles   Culture   Final    NO GROWTH 4 DAYS Performed at [facility], [address] [place], [place] [address]    Report Status PENDING  Incomplete     Time coordinating discharge: Over 30 minutes  SIGNED:   Azucena Fallen, DO [facility] 06/14/2022, 8:19 AM Pager   If 7PM-7AM, please contact night-coverage [email or web address]

## 2022-06-14 NOTE — TOC Transition Note (Signed)
Transition of Care [facility]) - CM/SW Discharge Note   Patient Details  Name: Cynthia Landry MRN: [id number] Date of Birth: [birth year]  [facility] CM/SW Contact:  Gildardo Griffes, LCSW Phone Number: 06/14/2022, 9:24 AM   Clinical Narrative:     Patient will DC to: [place] [facility] care Anticipated DC date: 06/14/22 Transport by: Wendie Simmer  Per MD patient ready for DC to  [facility]. RN, patient, patient's family, and facility notified of DC. Discharge Summary sent to facility. RN given number for report [phone].[name] DC packet on chart. Ambulance transport requested for patient.  CSW signing off.  Angeline Slim, LCSW    Final next level of care: Skilled Nursing Facility Barriers to Discharge: No Barriers Identified   Patient Goals and CMS Choice Patient states their goals for this hospitalization and ongoing recovery are:: to go home CMS Medicare.gov Compare Post Acute Care list provided to:: Patient Choice offered to / list presented to : Patient  Discharge Placement                    Patient and family notified of of transfer: 06/14/22  Discharge Plan and Services     Post Acute Care Choice: Resumption of Svcs/PTA Provider                               Social Determinants of Health (SDOH) Interventions     Readmission Risk Interventions     No data to display

## 2022-06-14 NOTE — Progress Notes (Signed)
Cross Cover Data - Patietnt of left upper estremity extending from just above wrist to just below shoulder. Warm to touch. No IV access present. No history of contact allergies. No knew meds recently. Rash not present other areas, Patient denies history of similar prior events. Action - Doppler R/O VTE Diphenhydramine 12.5 mg IV Elevated extremity on pillow Response - nurse reports redness improving; doppler negative

## 2022-06-15 LAB — CULTURE, BLOOD (ROUTINE X 2)
Culture: NO GROWTH
Culture: NO GROWTH
Special Requests: ADEQUATE

## 2022-06-16 ENCOUNTER — Other Ambulatory Visit: Payer: Self-pay

## 2022-06-16 ENCOUNTER — Emergency Department
Admission: EM | Admit: 2022-06-16 | Discharge: 2022-06-17 | Disposition: A | Discharge status: Skilled Nursing Facility | Payer: Medicare Other | Attending: Emergency Medicine | Admitting: Emergency Medicine

## 2022-06-16 ENCOUNTER — Emergency Department: Payer: Medicare Other

## 2022-06-16 DIAGNOSIS — E119 Type 2 diabetes mellitus without complications: Secondary | ICD-10-CM | POA: Insufficient documentation

## 2022-06-16 DIAGNOSIS — R079 Chest pain, unspecified: Secondary | ICD-10-CM | POA: Diagnosis present

## 2022-06-16 DIAGNOSIS — E039 Hypothyroidism, unspecified: Secondary | ICD-10-CM | POA: Insufficient documentation

## 2022-06-16 DIAGNOSIS — R0602 Shortness of breath: Secondary | ICD-10-CM

## 2022-06-16 DIAGNOSIS — D72829 Elevated white blood cell count, unspecified: Secondary | ICD-10-CM | POA: Insufficient documentation

## 2022-06-16 DIAGNOSIS — J9601 Acute respiratory failure with hypoxia: Secondary | ICD-10-CM | POA: Diagnosis not present

## 2022-06-16 DIAGNOSIS — I110 Hypertensive heart disease with heart failure: Secondary | ICD-10-CM | POA: Diagnosis not present

## 2022-06-16 DIAGNOSIS — I509 Heart failure, unspecified: Secondary | ICD-10-CM | POA: Insufficient documentation

## 2022-06-16 DIAGNOSIS — Z20822 Contact with and (suspected) exposure to covid-19: Secondary | ICD-10-CM | POA: Diagnosis not present

## 2022-06-16 LAB — MAGNESIUM: Magnesium: 2.2 mg/dL (ref 1.7–2.4)

## 2022-06-16 LAB — BASIC METABOLIC PANEL
Anion gap: 10 (ref 5–15)
BUN: <5 mg/dL — ABNORMAL LOW (ref 8–23)
CO2: 30 mmol/L (ref 22–32)
Calcium: 7.8 mg/dL — ABNORMAL LOW (ref 8.9–10.3)
Chloride: 102 mmol/L (ref 98–111)
Creatinine, Ser: 0.51 mg/dL (ref 0.44–1.00)
GFR, Estimated: >60 mL/min (ref >60)
Glucose, Bld: 159 mg/dL — ABNORMAL HIGH (ref 70–99)
Potassium: 4.9 mmol/L (ref 3.5–5.1)
Sodium: 142 mmol/L (ref 135–145)

## 2022-06-16 LAB — CBC
HCT: 47.7 % — ABNORMAL HIGH (ref 36.0–46.0)
Hemoglobin: 14.1 g/dL (ref 12.0–15.0)
MCH: 28.1 pg (ref 26.0–34.0)
MCHC: 29.6 g/dL — ABNORMAL LOW (ref 30.0–36.0)
MCV: 95.2 fL (ref 80.0–100.0)
Platelets: 296 10*3/uL (ref 150–400)
RBC: 5.01 MIL/uL (ref 3.87–5.11)
RDW: 12.8 % (ref 11.5–15.5)
WBC: 12.1 10*3/uL — ABNORMAL HIGH (ref 4.0–10.5)
nRBC: 0 % (ref 0.0–0.2)

## 2022-06-16 LAB — TROPONIN I (HIGH SENSITIVITY)
Troponin I (High Sensitivity): 22 ng/L — ABNORMAL HIGH (ref <18)
Troponin I (High Sensitivity): 21 ng/L — ABNORMAL HIGH (ref <18)

## 2022-06-16 LAB — BRAIN NATRIURETIC PEPTIDE: B Natriuretic Peptide: 96.6 pg/mL (ref 0.0–100.0)

## 2022-06-16 LAB — PROCALCITONIN: Procalcitonin: <0.1 ng/mL

## 2022-06-16 MED ORDER — IOHEXOL 350 MG/ML SOLN
75.0000 mL | Freq: Once | INTRAVENOUS | Status: AC | PRN
  Administered 2022-06-17: 75 mL via INTRAVENOUS

## 2022-06-16 MED ORDER — ONDANSETRON HCL 4 MG/2ML IJ SOLN
4.0000 mg | Freq: Once | INTRAMUSCULAR | Status: AC
  Administered 2022-06-16: 4 mg via INTRAVENOUS
  Filled 2022-06-16: qty 2

## 2022-06-16 MED ORDER — LIDOCAINE 5 % EX PTCH
1.0000 | MEDICATED_PATCH | CUTANEOUS | Status: DC
Start: 2022-06-16 — End: 2022-06-17
  Administered 2022-06-16: 1 via TRANSDERMAL
  Filled 2022-06-16: qty 1

## 2022-06-16 NOTE — ED Triage Notes (Signed)
Pt c/o chest pain since this afternoon did not resolve after 3 SL nitro. Pt c/o 9/10 chest pain. Pt alert and oriented.

## 2022-06-16 NOTE — ED Provider Notes (Signed)
11:56 PM  Assumed care at shift change.  Patient here with history of ALS with chest pain and shortness of breath.  Patient lives in a nursing facility.  CTA of the chest pending for further evaluation.   1:13 AM  Pt's CT chest reviewed and interpreted by myself and the radiologist.  No PE, pleural effusion, edema, pneumothorax, infiltrate.  Patient is currently satting at 100% on room air off of oxygen.  Her daughter states that she does wear oxygen intermittently already at her nursing facility and wears CPAP at night.  Given she is on oxygen chronically, sats are normal at this time and work-up has been reassuring, I feel it is reasonable to discharge her back to her nursing facility.  She states she is feeling better and not having chest pain or shortness of breath currently.  Her troponins here have been negative.  Patient and daughter comfortable with this plan and she will follow-up with her outpatient doctors for further management.   At this time, I do not feel there is any life-threatening condition present. I reviewed all nursing notes, vitals, pertinent previous records.  All lab and urine results, EKGs, imaging ordered have been independently reviewed and interpreted by myself.  I reviewed all available radiology reports from any imaging ordered this visit.  Based on my assessment, I feel the patient is safe to be discharged home without further emergent workup and can continue workup as an outpatient as needed. Discussed all findings, treatment plan as well as usual and customary return precautions.  They verbalize understanding and are comfortable with this plan.  Outpatient follow-up has been provided as needed.  All questions have been answered.    Coralee Edberg, Layla Maw, DO 06/17/22 [phone]

## 2022-06-16 NOTE — ED Provider Notes (Signed)
[facility] Provider Note    Event Date/Time   First MD Initiated Contact with Patient 06/16/22 1840     (approximate)   History   Chief Complaint Chest Pain   HPI  Cynthia Landry is a 66 y.o. female with past medical history of ALS, hypertension, diabetes, CHF, peripheral vascular disease, hepatitis C, and hypothyroidism who presents to the ED complaining of chest pain.  Patient reports that she has been dealing with constant pain in the center of her chest starting earlier today.  She describes the pain as a dull ache that is not exacerbated or alleviated by anything in particular.  She has not had any fevers or cough, denies any pain or swelling in her legs.  She was given 3 sublingual nitroglycerin prior to arrival at her son nursing facility without any relief.  She denies any trauma to her chest recently.  Daughter at bedside states she was recently admitted to the hospital for altered mental status, however her mental status seems to be much improved recently.     Physical Exam   Triage Vital Signs: ED Triage Vitals  Enc Vitals Group     BP      Pulse      Resp      Temp      Temp src      SpO2      Weight      Height      Head Circumference      Peak Flow      Pain Score      Pain Loc      Pain Edu?      Excl. in GC?     Most recent vital signs: Vitals:   06/16/22 2030 06/16/22 2100  BP: (!) 145/72 (!) 142/70  Pulse: 72 68  Resp: 20 17  Temp:    SpO2: 100% 100%    Constitutional: Alert and oriented. Eyes: Conjunctivae are normal. Head: Atraumatic. Nose: No congestion/rhinnorhea. Mouth/Throat: Mucous membranes are moist.  Cardiovascular: Normal rate, regular rhythm. Grossly normal heart sounds.  2+ radial pulses bilaterally. Respiratory: Normal respiratory effort.  No retractions. Lungs CTAB.  Anterior chest wall tenderness to palpation noted. Gastrointestinal: Soft and nontender. No distention. Musculoskeletal: No lower  extremity tenderness nor edema.  Neurologic:  Normal speech and language. No gross focal neurologic deficits are appreciated.    ED Results / Procedures / Treatments   Labs (all labs ordered are listed, but only abnormal results are displayed) Labs Reviewed  CBC - Abnormal; Notable for the following components:      Result Value   WBC 12.1 (*)    HCT 47.7 (*)    MCHC 29.6 (*)    All other components within normal limits  BASIC METABOLIC PANEL - Abnormal; Notable for the following components:   Glucose, Bld 159 (*)    BUN <5 (*)    Calcium 7.8 (*)    All other components within normal limits  TROPONIN I (HIGH SENSITIVITY) - Abnormal; Notable for the following components:   Troponin I (High Sensitivity) 22 (*)    All other components within normal limits  TROPONIN I (HIGH SENSITIVITY) - Abnormal; Notable for the following components:   Troponin I (High Sensitivity) 21 (*)    All other components within normal limits  SARS CORONAVIRUS 2 BY RT PCR  MAGNESIUM  PROCALCITONIN  BRAIN NATRIURETIC PEPTIDE     EKG  ED ECG REPORT I, Chesley Noon, the  attending physician, personally viewed and interpreted this ECG.   Date: 06/16/2022  EKG Time: 18:48  Rate: 75  Rhythm: normal sinus rhythm  Axis: Normal  Intervals: Prolonged QT  ST&T Change: None  RADIOLOGY Chest x-ray reviewed and interpreted by me with possible mild edema as well as infiltrate in the left lower lobe.  PROCEDURES:  Critical Care performed: Yes, see critical care procedure note(s)  .Critical Care  Performed by: Chesley Noon, MD Authorized by: Chesley Noon, MD   Critical care provider statement:    Critical care time (minutes):  30   Critical care time was exclusive of:  Separately billable procedures and treating other patients and teaching time   Critical care was necessary to treat or prevent imminent or life-threatening deterioration of the following conditions:  Respiratory failure    Critical care was time spent personally by me on the following activities:  Development of treatment plan with patient or surrogate, discussions with consultants, evaluation of patient's response to treatment, examination of patient, ordering and review of laboratory studies, ordering and review of radiographic studies, ordering and performing treatments and interventions, pulse oximetry, re-evaluation of patient's condition and review of old charts   I assumed direction of critical care for this patient from another provider in my specialty: no      MEDICATIONS ORDERED IN ED: Medications  lidocaine (LIDODERM) 5 % 1 patch (1 patch Transdermal Patch Applied 06/16/22 1941)  iohexol (OMNIPAQUE) 350 MG/ML injection 75 mL (has no administration in time range)  ondansetron (ZOFRAN) injection 4 mg (4 mg Intravenous Given 06/16/22 2111)     IMPRESSION / MDM / ASSESSMENT AND PLAN / ED COURSE  I reviewed the triage vital signs and the nursing notes.                              66 y.o. female with past medical history of ALS, hypertension, diabetes, CHF, peripheral vascular disease, hepatitis C, and hypothyroidism who presents to the ED for constant pain in the center of her chest starting earlier today.  Patient's presentation is most consistent with acute presentation with potential threat to life or bodily function.  Differential diagnosis includes, but is not limited to, ACS, PE, pneumonia, pneumothorax, dissection, musculoskeletal pain, GERD, and anxiety.  Patient well-appearing and in no acute distress, vital signs are unremarkable.  EKG shows no evidence of arrhythmia or ischemia, symptoms seem atypical for ACS and are reproducible with palpation.  We will check chest x-ray and labs including 2 sets of troponin given onset earlier today.  Low suspicion for PE at this time given reproducible pain and reassuring vital signs.  Plan to treat symptomatically with Lidoderm patch and reassess.  Shortly  after my evaluation, patient noted to drop O2 sats to 88% on room air, placed on 2 L nasal cannula with improvement.  Chest x-ray shows mild pulmonary edema versus focal infiltrate, however BNP within normal limits and lower suspicion for CHF at this time.  We will further assess with CTA for PE versus pneumonia.  Remainder of labs are reassuring with troponin mildly elevated but decreased from prior baseline and stable on recheck.  No significant electrolyte abnormality or AKI noted, patient with mild leukocytosis but no significant anemia.  Anticipate admission for hypoxic respiratory failure, patient turned over to oncoming provider pending imaging results.      FINAL CLINICAL IMPRESSION(S) / ED DIAGNOSES   Final diagnoses:  Chest pain, unspecified type  Shortness of breath  Acute respiratory failure with hypoxia (HCC)     Rx / DC Orders   ED Discharge Orders     None        Note:  This document was prepared using Dragon voice recognition software and may include unintentional dictation errors.   Chesley Noon, MD 06/16/22 2356

## 2022-06-17 ENCOUNTER — Non-Acute Institutional Stay: Payer: Commercial Managed Care - HMO | Admitting: Nurse Practitioner

## 2022-06-17 ENCOUNTER — Encounter: Payer: Self-pay | Admitting: Nurse Practitioner

## 2022-06-17 VITALS — BP 160/80 | HR 78 | Temp 97.0°F | Resp 18 | Wt 190.0 lb

## 2022-06-17 DIAGNOSIS — R1310 Dysphagia, unspecified: Secondary | ICD-10-CM

## 2022-06-17 DIAGNOSIS — R5381 Other malaise: Secondary | ICD-10-CM

## 2022-06-17 DIAGNOSIS — Z515 Encounter for palliative care: Secondary | ICD-10-CM

## 2022-06-17 DIAGNOSIS — R079 Chest pain, unspecified: Secondary | ICD-10-CM | POA: Diagnosis not present

## 2022-06-17 DIAGNOSIS — G1221 Amyotrophic lateral sclerosis: Secondary | ICD-10-CM

## 2022-06-17 LAB — SARS CORONAVIRUS 2 BY RT PCR: SARS Coronavirus 2 by RT PCR: NEGATIVE

## 2022-06-17 NOTE — Progress Notes (Signed)
[place] Consult Note Telephone: [phone]  Fax: [phone]    Date of encounter: 06/17/22 4:06 PM PATIENT NAME: Cynthia Landry [address] [place] [address]   [phone] (home)  DOB: [birth year] MRN: [id number] PRIMARY CARE PROVIDER:    [facility]  RESPONSIBLE PARTY:    Contact Information     Name Relation Home Work Mobile   [place] Daughter [phone]     Obie Dredge Significant other   [phone]       I met face to face with patient in facility. Palliative Care was asked to follow this patient by consultation request of  [place] to address advance care planning and complex medical decision making. This is a follow up visit.                                  ASSESSMENT AND PLAN / RECOMMENDATIONS:  Symptom Management/Plan: ACP: DNR   Palliative care 5 / 7 / 2019 until present Palliative care 11 / 21 / 2017 to 11 / 9 / 2018 Hospice 11 / 11 / 2018 to 5 / 1 / 2019   2. Dysphagia; secondary to ALS. Continue to monitor weights, appetite, aspiration precautions.   05/24/2022 weight 190 lbs 3. Debility secondary to ALS, encourage passive rom; encourage to transfer to geri-chair.    4. Palliative care encounter; Palliative medicine team will continue to support patient, patient's family, and medical team. Visit consisted of counseling and education dealing with the complex and emotionally intense issues of symptom management and palliative care in the setting of serious and potentially life-threatening illness   5. F/u 2 weeks; for ongoing monitoring weight, dysphagia, ALS progression   Follow up Palliative Care Visit: Palliative care will continue to follow for complex medical decision making, advance care planning, and clarification of goals.    I spent 46 minutes providing this consultation starting at 3:15pm. More than 50% of the time in this consultation was  spent in counseling and care coordination. PPS: 30%   Chief Complaint: Follow up palliative consult for complex medical decision making   HISTORY OF PRESENT ILLNESS:  Cynthia Landry is a 66 y.o. year old female  with multiple medical problems including. ALS, dysphasia, hepatitis C, left humerus pathological fractures, muscle weakness. Ms. Ron was hospitalized 06/10/2022 to 06/14/2022 for slurred speech, repeating same phrases, coughing, while eating. Workup significant for worsening mental status with UTI secondary to E.coli; ESBL; She was d/c back to [place] at [facility]. Ms. Rehfeldt was sent to ED from 06/16/2022 to 06/17/2022 for chest pains, did not feel ACS, d/c back to [facility]. Ms. Titterington remains Bed-bound, total ADL dependents including bathing, dressing, toileting as she is incontinent of bowel and bladder. Ms. Brandenburger requires to be fed by staff. Ms. Hiers is a DNR. At present, Ms. Busk is lying in bed, appears debilitated, comfortable. No visitors present. I visited and observed Ms. Merle. We talked about purpose of PC visit. Ms. Orlowski in agreement. Ms. Maqueda endorses she has been through a lot recently, very tired. We talked about ros, some worsening dysphagia. Family has been bringing her food from outside the facility. Ms. Brinegar appears to be having more difficulty with her speech, slower with words, processing. Ms. Pitstick and I talked about ALS, progression, concerns with disease progression. We talked about quality of  life.  We talked about when her daughter visit this past weekend. We talked about what brings Ms. Hutt joy. Medical goals reviewed. We talked about role pc in poc. I have attempted to reach daughter, I updated staff, no new changes recommended today.      History obtained from review of EMR, discussion with facility staff and Ms. Barbar.  I reviewed available labs, medications, imaging, studies and related documents from the EMR.  Records reviewed and  summarized above.    ROS 10 point system reviewed with [name], facility staff all negative except HPI   Physical Exam: Constitutional: NAD General: obese debilitated famale EYES: lids intact ENMT: oral mucous membranes moist CV: S1S2, RRR Pulmonary: clear, decreased bases, no increased work of breathing, no cough, room air Abdomen: normo-active BS + 4 quadrants, soft and non tender MSK: paraplegic Skin: warm and dry Neuro:  + generalized weakness,  no cognitive impairment Psych: non-anxious affect, A and O x 2 Thank you for the opportunity to participate in the care of Ms. Davoli.  The palliative care team will continue to follow. Please call our office at [phone] if we can be of additional assistance.   Layni Kreamer Z Tsering Leaman, NP  ith symptoms/positive test in the past 5-10 days?

## 2022-06-17 NOTE — Discharge Instructions (Addendum)
Your work-up today was reassuring with normal cardiac labs and a CT scan that showed no pneumonia, fluid on your lungs or blood clot.  Please use your oxygen at your nursing facility when short of breath or when oxygen saturations 89% or less.  Please use your CPAP when sleeping at night every night.

## 2022-07-01 ENCOUNTER — Non-Acute Institutional Stay: Payer: Commercial Managed Care - HMO | Admitting: Nurse Practitioner

## 2022-07-01 ENCOUNTER — Encounter: Payer: Self-pay | Admitting: Nurse Practitioner

## 2022-07-01 VITALS — BP 132/60 | HR 70 | Temp 98.0°F | Resp 18 | Wt 187.5 lb

## 2022-07-01 DIAGNOSIS — R5381 Other malaise: Secondary | ICD-10-CM

## 2022-07-01 DIAGNOSIS — G1221 Amyotrophic lateral sclerosis: Secondary | ICD-10-CM

## 2022-07-01 DIAGNOSIS — R1310 Dysphagia, unspecified: Secondary | ICD-10-CM

## 2022-07-01 DIAGNOSIS — Z515 Encounter for palliative care: Secondary | ICD-10-CM

## 2022-07-01 NOTE — Progress Notes (Signed)
Therapist, nutritional Palliative Care Consult Note Telephone: [phone]  Fax: [phone]    Date of encounter: 07/01/22 3:20 PM PATIENT NAME: Cynthia Landry [address] [place] [address]   [phone] (home)  DOB: 08-Oct-1956 MRN: [id number] PRIMARY CARE PROVIDER:    [facility],  [address] [place] [address] [phone]  REFERRING PROVIDER:   [facility] [address] [place],  [place] [address] [phone]  RESPONSIBLE PARTY:    Contact Information     Name Relation Home Work Mobile   [place] Daughter [phone]     Grier Rocher Significant other   [phone]     Symptom Management/Plan: ACP: DNR   Palliative care 5 / 7 / 2019 until present Palliative care 11 / 21 / 2017 to 11 / 9 / 2018 Hospice 11 / 11 / 2018 to 5 / 1 / 2019   2. Dysphagia; secondary to ALS. Continue to monitor weights, appetite, aspiration precautions.  04/17/2022 weight 192.0 lbs 05/24/2022 weight 190 lbs 06/27/2022 weight 187.5 lbs 3. Debility secondary to ALS, encourage passive rom; encourage to transfer to geri-chair.    4. Palliative care encounter; Palliative medicine team will continue to support patient, patient's family, and medical team. Visit consisted of counseling and education dealing with the complex and emotionally intense issues of symptom management and palliative care in the setting of serious and potentially life-threatening illness   5. F/u 4 weeks; for ongoing monitoring weight, dysphagia, ALS progression   Follow up Palliative Care Visit: Palliative care will continue to follow for complex medical decision making, advance care planning, and clarification of goals.    I spent 47 minutes providing this consultation starting at 1:30pm. More than 50% of the time in this consultation was spent in counseling and care coordination. PPS: 30%   Chief Complaint: Follow  up palliative consult for complex medical decision making   HISTORY OF PRESENT ILLNESS:  Cynthia Landry is a 66 y.o. year old female  with multiple medical problems including. ALS, dysphasia, hepatitis C, left humerus pathological fractures, muscle weakness. Cynthia Landry was hospitalized 06/10/2022 to 06/14/2022 for slurred speech, repeating same phrases, coughing, while eating. Workup significant for worsening mental status with UTI secondary to E.coli; ESBL; She was d/c back to [facility]. Cynthia Landry was sent to ED from 06/16/2022 to 06/17/2022 for chest pains, did not feel ACS, d/c back to [facility]. Cynthia Landry remains Bed-bound, total ADL dependents including bathing, dressing, toileting as she is incontinent of bowel and bladder. Cynthia Landry requires to be fed by staff. Cynthia Landry is a DNR. At present, Cynthia Landry is lying in recliner, appears debilitated, comfortable. No visitors present. I visited and observed Cynthia Landry. Cynthia Landry endorses she continues to be very tired. We talked about ros, some worsening dysphagia. Cynthia Landry and I talked about ALS, progression, concerns with disease progression. We talked about quality of life.  We talked about what brings Cynthia Landry joy. Quality of life, limited as Cynthia Landry was tired. Medical goals reviewed. We talked about role pc in poc. I have attempted to reach daughter, I updated staff, no new changes recommended today.      History obtained from review of EMR, discussion with facility staff and Cynthia Landry.  I reviewed available labs, medications, imaging, studies and related documents from the EMR.  Records reviewed and summarized above.  ROS 10 point system reviewed with Cynthia Landry, facility staff all negative except HPI   Physical Exam: Constitutional: NAD General: obese debilitated famale EYES: lids intact ENMT: oral mucous membranes moist CV: S1S2, RRR Pulmonary: clear, decreased bases, no increased work of breathing,  no cough, room air Abdomen: normo-active BS + 4 quadrants, soft and non tender MSK: paraplegic Skin: warm and dry Neuro:  + generalized weakness,  no cognitive impairment Psych: non-anxious affect, A and O x 2 Thank you for the opportunity to participate in the care of Cynthia Landry.  The palliative care team will continue to follow. Please call our office at [phone] if we can be of additional assistance.   Maximilien Hayashi [place], NP

## 2022-11-16 ENCOUNTER — Emergency Department

## 2022-11-16 ENCOUNTER — Inpatient Hospital Stay
Admission: EM | Admit: 2022-11-16 | Discharge: 2022-11-22 | DRG: 871 | Disposition: A | Discharge status: Skilled Nursing Facility | Source: Skilled Nursing Facility | Attending: Student in an Organized Health Care Education/Training Program | Admitting: Student in an Organized Health Care Education/Training Program

## 2022-11-16 ENCOUNTER — Other Ambulatory Visit: Payer: Self-pay

## 2022-11-16 DIAGNOSIS — G9340 Encephalopathy, unspecified: Secondary | ICD-10-CM | POA: Diagnosis not present

## 2022-11-16 DIAGNOSIS — I5032 Chronic diastolic (congestive) heart failure: Secondary | ICD-10-CM | POA: Diagnosis present

## 2022-11-16 DIAGNOSIS — F419 Anxiety disorder, unspecified: Secondary | ICD-10-CM | POA: Diagnosis present

## 2022-11-16 DIAGNOSIS — G9341 Metabolic encephalopathy: Secondary | ICD-10-CM | POA: Diagnosis present

## 2022-11-16 DIAGNOSIS — Z1612 Extended spectrum beta lactamase (ESBL) resistance: Secondary | ICD-10-CM | POA: Diagnosis present

## 2022-11-16 DIAGNOSIS — R627 Adult failure to thrive: Secondary | ICD-10-CM | POA: Diagnosis present

## 2022-11-16 DIAGNOSIS — J449 Chronic obstructive pulmonary disease, unspecified: Secondary | ICD-10-CM | POA: Diagnosis present

## 2022-11-16 DIAGNOSIS — Z66 Do not resuscitate: Secondary | ICD-10-CM | POA: Diagnosis present

## 2022-11-16 DIAGNOSIS — F32A Depression, unspecified: Secondary | ICD-10-CM | POA: Diagnosis present

## 2022-11-16 DIAGNOSIS — A4151 Sepsis due to Escherichia coli [E. coli]: Secondary | ICD-10-CM | POA: Diagnosis present

## 2022-11-16 DIAGNOSIS — Z79899 Other long term (current) drug therapy: Secondary | ICD-10-CM

## 2022-11-16 DIAGNOSIS — R319 Hematuria, unspecified: Secondary | ICD-10-CM | POA: Diagnosis present

## 2022-11-16 DIAGNOSIS — Z7984 Long term (current) use of oral hypoglycemic drugs: Secondary | ICD-10-CM

## 2022-11-16 DIAGNOSIS — I110 Hypertensive heart disease with heart failure: Secondary | ICD-10-CM | POA: Diagnosis present

## 2022-11-16 DIAGNOSIS — E1151 Type 2 diabetes mellitus with diabetic peripheral angiopathy without gangrene: Secondary | ICD-10-CM | POA: Diagnosis present

## 2022-11-16 DIAGNOSIS — N390 Urinary tract infection, site not specified: Secondary | ICD-10-CM | POA: Diagnosis present

## 2022-11-16 DIAGNOSIS — E876 Hypokalemia: Secondary | ICD-10-CM | POA: Diagnosis present

## 2022-11-16 DIAGNOSIS — Z8619 Personal history of other infectious and parasitic diseases: Secondary | ICD-10-CM

## 2022-11-16 DIAGNOSIS — Z20822 Contact with and (suspected) exposure to covid-19: Secondary | ICD-10-CM | POA: Diagnosis present

## 2022-11-16 DIAGNOSIS — G35 Multiple sclerosis: Secondary | ICD-10-CM | POA: Diagnosis present

## 2022-11-16 DIAGNOSIS — E860 Dehydration: Secondary | ICD-10-CM | POA: Diagnosis not present

## 2022-11-16 DIAGNOSIS — Z9981 Dependence on supplemental oxygen: Secondary | ICD-10-CM

## 2022-11-16 DIAGNOSIS — E119 Type 2 diabetes mellitus without complications: Secondary | ICD-10-CM

## 2022-11-16 DIAGNOSIS — Z833 Family history of diabetes mellitus: Secondary | ICD-10-CM

## 2022-11-16 DIAGNOSIS — Z1619 Resistance to other specified beta lactam antibiotics: Secondary | ICD-10-CM | POA: Diagnosis present

## 2022-11-16 DIAGNOSIS — E039 Hypothyroidism, unspecified: Secondary | ICD-10-CM | POA: Diagnosis present

## 2022-11-16 DIAGNOSIS — Z794 Long term (current) use of insulin: Secondary | ICD-10-CM

## 2022-11-16 DIAGNOSIS — R5383 Other fatigue: Secondary | ICD-10-CM | POA: Diagnosis present

## 2022-11-16 DIAGNOSIS — R4182 Altered mental status, unspecified: Secondary | ICD-10-CM

## 2022-11-16 DIAGNOSIS — R7989 Other specified abnormal findings of blood chemistry: Secondary | ICD-10-CM | POA: Insufficient documentation

## 2022-11-16 DIAGNOSIS — E785 Hyperlipidemia, unspecified: Secondary | ICD-10-CM | POA: Diagnosis present

## 2022-11-16 DIAGNOSIS — K219 Gastro-esophageal reflux disease without esophagitis: Secondary | ICD-10-CM | POA: Diagnosis present

## 2022-11-16 DIAGNOSIS — R6520 Severe sepsis without septic shock: Secondary | ICD-10-CM | POA: Diagnosis present

## 2022-11-16 DIAGNOSIS — I10 Essential (primary) hypertension: Secondary | ICD-10-CM | POA: Diagnosis present

## 2022-11-16 DIAGNOSIS — G1221 Amyotrophic lateral sclerosis: Secondary | ICD-10-CM | POA: Diagnosis present

## 2022-11-16 DIAGNOSIS — Z886 Allergy status to analgesic agent status: Secondary | ICD-10-CM

## 2022-11-16 DIAGNOSIS — I9589 Other hypotension: Secondary | ICD-10-CM | POA: Diagnosis present

## 2022-11-16 DIAGNOSIS — Z79890 Hormone replacement therapy (postmenopausal): Secondary | ICD-10-CM

## 2022-11-16 DIAGNOSIS — Z8249 Family history of ischemic heart disease and other diseases of the circulatory system: Secondary | ICD-10-CM

## 2022-11-16 DIAGNOSIS — Z888 Allergy status to other drugs, medicaments and biological substances status: Secondary | ICD-10-CM

## 2022-11-16 DIAGNOSIS — G8929 Other chronic pain: Secondary | ICD-10-CM | POA: Diagnosis present

## 2022-11-16 LAB — COMPREHENSIVE METABOLIC PANEL
ALT: 15 U/L (ref 0–44)
AST: 25 U/L (ref 15–41)
Albumin: 2.8 g/dL — ABNORMAL LOW (ref 3.5–5.0)
Alkaline Phosphatase: 61 U/L (ref 38–126)
Anion gap: 11 (ref 5–15)
BUN: 25 mg/dL — ABNORMAL HIGH (ref 8–23)
CO2: 32 mmol/L (ref 22–32)
Calcium: 9.2 mg/dL (ref 8.9–10.3)
Chloride: 95 mmol/L — ABNORMAL LOW (ref 98–111)
Creatinine, Ser: 0.64 mg/dL (ref 0.44–1.00)
GFR, Estimated: >60 mL/min (ref >60)
Glucose, Bld: 146 mg/dL — ABNORMAL HIGH (ref 70–99)
Potassium: 2.5 mmol/L — CL (ref 3.5–5.1)
Sodium: 138 mmol/L (ref 135–145)
Total Bilirubin: 1.3 mg/dL — ABNORMAL HIGH (ref 0.3–1.2)
Total Protein: 7.7 g/dL (ref 6.5–8.1)

## 2022-11-16 LAB — URINALYSIS, ROUTINE W REFLEX MICROSCOPIC
Bilirubin Urine: NEGATIVE
Glucose, UA: NEGATIVE mg/dL
Ketones, ur: NEGATIVE mg/dL
Nitrite: NEGATIVE
Protein, ur: 100 mg/dL — AB
RBC / HPF: >50 RBC/hpf — ABNORMAL HIGH (ref 0–5)
Specific Gravity, Urine: 1.023 (ref 1.005–1.030)
WBC, UA: >50 WBC/hpf — ABNORMAL HIGH (ref 0–5)
pH: 5 (ref 5.0–8.0)

## 2022-11-16 LAB — CBC WITH DIFFERENTIAL/PLATELET
Abs Immature Granulocytes: 0.53 10*3/uL — ABNORMAL HIGH (ref 0.00–0.07)
Basophils Absolute: 0.1 10*3/uL (ref 0.0–0.1)
Basophils Relative: 0 %
Eosinophils Absolute: 0.1 10*3/uL (ref 0.0–0.5)
Eosinophils Relative: 0 %
HCT: 37.8 % (ref 36.0–46.0)
Hemoglobin: 11.9 g/dL — ABNORMAL LOW (ref 12.0–15.0)
Immature Granulocytes: 2 %
Lymphocytes Relative: 10 %
Lymphs Abs: 2.8 10*3/uL (ref 0.7–4.0)
MCH: 29.2 pg (ref 26.0–34.0)
MCHC: 31.5 g/dL (ref 30.0–36.0)
MCV: 92.6 fL (ref 80.0–100.0)
Monocytes Absolute: 3.4 10*3/uL — ABNORMAL HIGH (ref 0.1–1.0)
Monocytes Relative: 12 %
Neutro Abs: 20.9 10*3/uL — ABNORMAL HIGH (ref 1.7–7.7)
Neutrophils Relative %: 76 %
Platelets: 256 10*3/uL (ref 150–400)
RBC: 4.08 MIL/uL (ref 3.87–5.11)
RDW: 12.9 % (ref 11.5–15.5)
Smear Review: NORMAL
WBC: 27.8 10*3/uL — ABNORMAL HIGH (ref 4.0–10.5)
nRBC: 0 % (ref 0.0–0.2)

## 2022-11-16 LAB — RESP PANEL BY RT-PCR (RSV, FLU A&B, COVID)  RVPGX2
Influenza A by PCR: NEGATIVE
Influenza B by PCR: NEGATIVE
Resp Syncytial Virus by PCR: NEGATIVE
SARS Coronavirus 2 by RT PCR: NEGATIVE

## 2022-11-16 LAB — TSH: TSH: 0.233 u[IU]/mL — ABNORMAL LOW (ref 0.350–4.500)

## 2022-11-16 LAB — MAGNESIUM: Magnesium: 2.1 mg/dL (ref 1.7–2.4)

## 2022-11-16 MED ORDER — HEPARIN SODIUM (PORCINE) 5000 UNIT/ML IJ SOLN
5000.0000 [IU] | Freq: Three times a day (TID) | INTRAMUSCULAR | Status: DC
  Administered 2022-11-17 – 2022-11-21 (×12): 5000 [IU] via SUBCUTANEOUS
  Filled 2022-11-16 (×13): qty 1

## 2022-11-16 MED ORDER — POLYETHYLENE GLYCOL 3350 17 GM/SCOOP PO POWD
17.0000 g | Freq: Every day | ORAL | Status: DC
  Administered 2022-11-19: 17 g via ORAL
  Filled 2022-11-16: qty 255

## 2022-11-16 MED ORDER — INSULIN ASPART 100 UNIT/ML IJ SOLN
0.0000 [IU] | Freq: Three times a day (TID) | INTRAMUSCULAR | Status: DC
  Administered 2022-11-19 – 2022-11-21 (×4): 1 [IU] via SUBCUTANEOUS
  Administered 2022-11-21: 2 [IU] via SUBCUTANEOUS
  Filled 2022-11-16 (×5): qty 1

## 2022-11-16 MED ORDER — CARVEDILOL 12.5 MG PO TABS
12.5000 mg | ORAL_TABLET | Freq: Two times a day (BID) | ORAL | Status: DC
  Administered 2022-11-17 – 2022-11-22 (×10): 12.5 mg via ORAL
  Filled 2022-11-16: qty 2
  Filled 2022-11-16 (×3): qty 1
  Filled 2022-11-16: qty 2
  Filled 2022-11-16 (×4): qty 1
  Filled 2022-11-16 (×2): qty 2
  Filled 2022-11-16: qty 1

## 2022-11-16 MED ORDER — SENNA 8.6 MG PO TABS
2.0000 | ORAL_TABLET | Freq: Two times a day (BID) | ORAL | Status: DC
  Administered 2022-11-16 – 2022-11-21 (×5): 17.2 mg via ORAL
  Filled 2022-11-16 (×8): qty 2

## 2022-11-16 MED ORDER — NITROGLYCERIN 0.4 MG SL SUBL: 0.4000 mg | SUBLINGUAL_TABLET | SUBLINGUAL | Status: DC | PRN

## 2022-11-16 MED ORDER — ACETAMINOPHEN 325 MG PO TABS: 650.0000 mg | ORAL_TABLET | ORAL | Status: DC | PRN

## 2022-11-16 MED ORDER — GABAPENTIN 300 MG PO CAPS
300.0000 mg | ORAL_CAPSULE | Freq: Every day | ORAL | Status: DC
  Administered 2022-11-16 – 2022-11-21 (×6): 300 mg via ORAL
  Filled 2022-11-16 (×6): qty 1

## 2022-11-16 MED ORDER — ONDANSETRON HCL 4 MG PO TABS: 4.0000 mg | ORAL_TABLET | Freq: Four times a day (QID) | ORAL | Status: DC | PRN

## 2022-11-16 MED ORDER — ONDANSETRON HCL 4 MG/2ML IJ SOLN: 4.0000 mg | Freq: Four times a day (QID) | INTRAMUSCULAR | Status: DC | PRN

## 2022-11-16 MED ORDER — SERTRALINE HCL 50 MG PO TABS
25.0000 mg | ORAL_TABLET | Freq: Every day | ORAL | Status: DC
  Administered 2022-11-17 – 2022-11-22 (×6): 25 mg via ORAL
  Filled 2022-11-16 (×6): qty 1

## 2022-11-16 MED ORDER — POTASSIUM CHLORIDE 10 MEQ/100ML IV SOLN
10.0000 meq | Freq: Once | INTRAVENOUS | Status: AC
  Administered 2022-11-16: 10 meq via INTRAVENOUS
  Filled 2022-11-16: qty 100

## 2022-11-16 MED ORDER — LEVOTHYROXINE SODIUM 50 MCG PO TABS
150.0000 ug | ORAL_TABLET | Freq: Every day | ORAL | Status: DC
  Administered 2022-11-17 – 2022-11-20 (×4): 150 ug via ORAL
  Filled 2022-11-16: qty 1
  Filled 2022-11-16: qty 3
  Filled 2022-11-16: qty 1
  Filled 2022-11-16: qty 3

## 2022-11-16 MED ORDER — POTASSIUM CHLORIDE IN NACL 20-0.9 MEQ/L-% IV SOLN
INTRAVENOUS | Status: DC
  Filled 2022-11-16: qty 1000

## 2022-11-16 MED ORDER — DULOXETINE HCL 30 MG PO CPEP
60.0000 mg | ORAL_CAPSULE | Freq: Every day | ORAL | Status: DC
  Administered 2022-11-17 – 2022-11-22 (×6): 60 mg via ORAL
  Filled 2022-11-16: qty 2
  Filled 2022-11-16 (×2): qty 1
  Filled 2022-11-16 (×3): qty 2

## 2022-11-16 MED ORDER — SODIUM CHLORIDE 0.9 % IV SOLN
1.0000 g | Freq: Once | INTRAVENOUS | Status: AC
  Administered 2022-11-16: 1 g via INTRAVENOUS
  Filled 2022-11-16: qty 10

## 2022-11-16 MED ORDER — POTASSIUM CHLORIDE 10 MEQ/100ML IV SOLN
10.0000 meq | INTRAVENOUS | Status: AC
  Administered 2022-11-16 – 2022-11-17 (×4): 10 meq via INTRAVENOUS
  Filled 2022-11-16 (×4): qty 100

## 2022-11-16 MED ORDER — TRAMADOL HCL 50 MG PO TABS
50.0000 mg | ORAL_TABLET | Freq: Two times a day (BID) | ORAL | Status: DC | PRN
  Administered 2022-11-17 – 2022-11-20 (×4): 50 mg via ORAL
  Filled 2022-11-16 (×5): qty 1

## 2022-11-16 MED ORDER — LACTATED RINGERS IV BOLUS
1000.0000 mL | Freq: Once | INTRAVENOUS | Status: AC
  Administered 2022-11-16: 1000 mL via INTRAVENOUS

## 2022-11-16 MED ORDER — ALBUTEROL SULFATE (2.5 MG/3ML) 0.083% IN NEBU: 2.5000 mg | INHALATION_SOLUTION | RESPIRATORY_TRACT | Status: DC | PRN

## 2022-11-16 MED ORDER — DIVALPROEX SODIUM 125 MG PO CSDR
250.0000 mg | DELAYED_RELEASE_CAPSULE | Freq: Every day | ORAL | Status: DC
  Administered 2022-11-17 – 2022-11-22 (×6): 250 mg via ORAL
  Filled 2022-11-16 (×7): qty 2

## 2022-11-16 MED ORDER — ACETAMINOPHEN 650 MG RE SUPP: 650.0000 mg | Freq: Four times a day (QID) | RECTAL | Status: DC | PRN

## 2022-11-16 MED ORDER — LATANOPROST 0.005 % OP SOLN
1.0000 [drp] | Freq: Every day | OPHTHALMIC | Status: DC
  Administered 2022-11-19 – 2022-11-21 (×3): 1 [drp] via OPHTHALMIC
  Filled 2022-11-16: qty 2.5

## 2022-11-16 MED ORDER — BACLOFEN 10 MG PO TABS
5.0000 mg | ORAL_TABLET | Freq: Three times a day (TID) | ORAL | Status: DC
  Administered 2022-11-16 – 2022-11-22 (×17): 5 mg via ORAL
  Filled 2022-11-16 (×18): qty 1

## 2022-11-16 MED ORDER — ACETAMINOPHEN 325 MG PO TABS: 650.0000 mg | ORAL_TABLET | Freq: Four times a day (QID) | ORAL | Status: DC | PRN

## 2022-11-16 MED ORDER — VITAMIN B-12 1000 MCG PO TABS
500.0000 ug | ORAL_TABLET | Freq: Every day | ORAL | Status: DC
  Administered 2022-11-19 – 2022-11-22 (×4): 500 ug via ORAL
  Filled 2022-11-16: qty 0.5
  Filled 2022-11-16 (×2): qty 1
  Filled 2022-11-16: qty 0.5
  Filled 2022-11-16: qty 1
  Filled 2022-11-16: qty 0.5
  Filled 2022-11-16: qty 1

## 2022-11-16 MED ORDER — SACUBITRIL-VALSARTAN 24-26 MG PO TABS
1.0000 | ORAL_TABLET | Freq: Two times a day (BID) | ORAL | Status: DC
  Administered 2022-11-16 – 2022-11-17 (×2): 1 via ORAL
  Filled 2022-11-16 (×3): qty 1

## 2022-11-16 MED ORDER — SODIUM CHLORIDE 0.9 % IV SOLN
2.0000 g | INTRAVENOUS | Status: DC
  Administered 2022-11-17 – 2022-11-18 (×2): 2 g via INTRAVENOUS
  Filled 2022-11-16 (×2): qty 20
  Filled 2022-11-16: qty 2

## 2022-11-16 MED ORDER — INSULIN GLARGINE-YFGN 100 UNIT/ML ~~LOC~~ SOLN
10.0000 [IU] | Freq: Every day | SUBCUTANEOUS | Status: DC
  Administered 2022-11-17 – 2022-11-21 (×6): 10 [IU] via SUBCUTANEOUS
  Filled 2022-11-16 (×7): qty 0.1

## 2022-11-16 NOTE — ED Triage Notes (Signed)
Pt to ED from [facility] healthcare via EMS. Pt has symptoms of failure to thrive that started last night. Pt was tested for COVID at facility and was NEG. This is abnormal per sister at bedside who is POA and legal guardian.

## 2022-11-16 NOTE — ED Notes (Signed)
Patient transported to CT 

## 2022-11-16 NOTE — ED Notes (Signed)
This RN was successful with I&O cath. There was lots of puss in the vagina. Pt was then cleaned up and placed in new brief.  This RN called [facility] . Requested call back from her assigned nurse.

## 2022-11-16 NOTE — ED Notes (Signed)
This RN attempted I&O cath. Unable to do so in triage recliner. Pt unable to assist at all. RN notes foul smell and discharge from either vagina or rectum. Unable to differentiate where thick colored discharge was coming from.

## 2022-11-16 NOTE — ED Notes (Signed)
[facility] Care RN Cynthia Landry called. She advised this was not a sudden onset. Pt has been gradually declining and feels family is in denial. Pt chart note for Friday stated pt was awake and verbal Friday morning. Pt was tested on Thursday for COVID and was NEG. Swallowing has been going away slowly as well due to her ALS diagnosis.

## 2022-11-16 NOTE — ED Provider Triage Note (Signed)
Emergency Medicine Provider Triage Evaluation Note  Cynthia Landry , a 66 y.o. female  was evaluated in triage.  Pt presents to the ER via EMS with altered mental status and unable/unwilling to eat since last night. She has ALS and is a resident of [facility]. She is on Hospice. POA would like to make sure she doesn't have a UTI or pneumonia that can be treated with antibiotics. She does not want CVA work up with CT, labs, etc...  Physical Exam  Ht 5\' 5"  (1.651 m)   BMI 31.20 kg/m  Gen:   Awake, no distress   Resp:  Normal effort  MSK:   Moves extremities without difficulty  Other:    Medical Decision Making  Medically screening exam initiated at 5:09 PM.  Appropriate orders placed.  AINA ROSSBACH was informed that the remainder of the evaluation will be completed by another provider, this initial triage assessment does not replace that evaluation, and the importance of remaining in the ED until their evaluation is complete.   Luciano Cutter, FNP 11/16/22 1713

## 2022-11-16 NOTE — ED Provider Notes (Signed)
[facility] Provider Note    Event Date/Time   First MD Initiated Contact with Patient 11/16/22 1824     (approximate)   History   Failure To Thrive   HPI  Cynthia Landry is a 66 y.o. female patient noted to be having decreased intake yesterday and today's not even drinking anything with fluid dribbled in her mouth to 6 there are drains out again.  Patient's not speaking today.  She is awake and will shake her head or nod her head when I ask if she has abdominal pain etc.  She does not have abdominal pain.  She has not been vomiting.  Does not appear to be coughing either.  Patient's blood pressure is low chronically by report. Patient with ALS     Physical Exam   Triage Vital Signs: ED Triage Vitals  Enc Vitals Group     BP 11/16/22 1709 (!) 94/52     Pulse Rate 11/16/22 1709 90     Resp 11/16/22 1709 16     Temp 11/16/22 1709 98.7 F (37.1 C)     Temp Source 11/16/22 1709 Oral     SpO2 11/16/22 1709 99 %     Weight --      Height 11/16/22 1701 5\' 5"  (1.651 m)     Head Circumference --      Peak Flow --      Pain Score --      Pain Loc --      Pain Edu? --      Excl. in GC? --     Most recent vital signs: Vitals:   11/16/22 1940 11/16/22 2000  BP: (!) 98/52 (!) 109/54  Pulse: 89 92  Resp:  16  Temp:    SpO2: 100% 100%     General: Awake, no distress.  CV:  Good peripheral perfusion.  Heart regular rate and rhythm no audible murmurs Resp:  Normal effort.  Lungs sound clear although exam is limited by patient's positioning Abd:  No distention.  Soft and nontender Extremities with no edema   ED Results / Procedures / Treatments   Labs (all labs ordered are listed, but only abnormal results are displayed) Labs Reviewed  URINALYSIS, ROUTINE W REFLEX MICROSCOPIC - Abnormal; Notable for the following components:      Result Value   Color, Urine AMBER (*)    APPearance CLOUDY (*)    Hgb urine dipstick LARGE (*)    Protein, ur  100 (*)    Leukocytes,Ua LARGE (*)    RBC / HPF >50 (*)    WBC, UA >50 (*)    Bacteria, UA MANY (*)    All other components within normal limits  COMPREHENSIVE METABOLIC PANEL - Abnormal; Notable for the following components:   Potassium 2.5 (*)    Chloride 95 (*)    Glucose, Bld 146 (*)    BUN 25 (*)    Albumin 2.8 (*)    Total Bilirubin 1.3 (*)    All other components within normal limits  CBC WITH DIFFERENTIAL/PLATELET - Abnormal; Notable for the following components:   WBC 27.8 (*)    Hemoglobin 11.9 (*)    Neutro Abs 20.9 (*)    Monocytes Absolute 3.4 (*)    Abs Immature Granulocytes 0.53 (*)    All other components within normal limits  URINE CULTURE  MAGNESIUM     EKG     RADIOLOGY Chest x-ray read by radiology reviewed  and interpreted by me shows what appears to be a left lower lung collapse.  On reviewing old CTs and chest x-ray this appears to be new. CT read by radiologist reviewed interpreted by me does not show the left lower lung collapse only some bilateral hazy densities essentially unchanged from prior  PROCEDURES:  Critical Care performed: Critical care time 45 minutes.  This includes speaking with the daughter several times and with the other family member.  I have reviewed her studies and I am speaking with the hospitalist.  Procedures   MEDICATIONS ORDERED IN ED: Medications  potassium chloride 10 mEq in 100 mL IVPB (10 mEq Intravenous New Bag/Given 11/16/22 1932)  lactated ringers bolus 1,000 mL (1,000 mLs Intravenous New Bag/Given 11/16/22 1930)  cefTRIAXone (ROCEPHIN) 1 g in sodium chloride 0.9 % 100 mL IVPB (0 g Intravenous Stopped 11/16/22 1923)     IMPRESSION / MDM / ASSESSMENT AND PLAN / ED COURSE  I reviewed the triage vital signs and the nursing notes. Discussed patient with daughter and other family member.  What I told them is that if she is not eating or drinking at all she will probably last about 2 or 3 days with no fluids.  It is  possible that if we replete the potassium and treat the infection with a high white count she might improve back to her baseline.  They want to try 48 hours of antibiotics and repletion of the potassium and IV fluids and see how she does.  Likely they will withdraw care that does not fix her. Differential diagnosis includes, but is not limited to, stroke, severe UTI, possible sepsis, severe electrolyte imbalance  Patient's presentation is most consistent with acute presentation with potential threat to life or bodily function.  The patient is on the cardiac monitor to evaluate for evidence of arrhythmia and/or significant heart rate changes.  None have been seen      FINAL CLINICAL IMPRESSION(S) / ED DIAGNOSES   Final diagnoses:  Hypokalemia  Urinary tract infection with hematuria, site unspecified  Altered mental status, unspecified altered mental status type     Rx / DC Orders   ED Discharge Orders     None        Note:  This document was prepared using Dragon voice recognition software and may include unintentional dictation errors.   Arnaldo Natal, MD 11/16/22 2017

## 2022-11-16 NOTE — H&P (Signed)
History and Physical    Cynthia Landry WSF:[id number] DOB: 05/26/1956 DOA: 11/16/2022  PCP: [facility]  Patient coming from: [facility]   I have personally briefly reviewed patient's old medical records in [facility]  Chief Complaint: weak decrease po intake x 2 days with progressive lethargy  HPI: Cynthia Landry is a 66 y.o. female with medical history significant of  ALS on bipap qhs, chronic O2 , anxiety/depression, CHF, DMII, HLD, HTN, Thyroid disease, Ataxia,vitamin D def, who presents BIB EMS due to 2 days of decrease po intake and  increase lethargy at  [facility].    ED Course:  Txm 100.5 bp 94/52-84/55 (107/58),  hr 90, rr 16, sat 99% on 4L  Labs: WBC 27.8, hgb 11.9 ( 14) MCV 92.6, pmn 20.9 Mag 2.1 N a 138, K 2.5, CL 95, glu 146, cr 0.64, tibili 1.3 UA +wbc>50, +bacteria + LE , + rbc  Resp panel pending  cxrMPRESSION: 1. Low lung volumes. 2. Left lower lobe opacity likely reflects partial collapse of the lower lobe. Airspace disease is considered less likely. CTH  No acute intracranial findings are seen. 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 No significant interval changes are noted since 06/10/2022.  CT thorax No acute intracranial findings are seen. 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 No significant interval changes are noted since 06/10/2022. Tx CTX Review of Systems: As per HPI otherwise 10 point review of systems negative.   Past Medical History:  Diagnosis Date   ALS (amyotrophic lateral sclerosis) (HCC)    Amyotrophic lateral sclerosis (HCC)    Anxiety    Ataxia    CHF (congestive heart failure) (HCC)    Chronic pain    Depression    Diabetes mellitus without complication (HCC)    GERD (gastroesophageal reflux  disease)    Hepatitis C    Hyperlipidemia    Hypertension    Psychosis (HCC)    PVD (peripheral vascular disease) (HCC)    Thyroid disease    Vitamin D deficiency     Past Surgical History:  Procedure Laterality Date   COLONOSCOPY     ESOPHAGOGASTRODUODENOSCOPY (EGD) WITH PROPOFOL N/A 12/30/2017   Procedure: ESOPHAGOGASTRODUODENOSCOPY (EGD) WITH PROPOFOL;  Surgeon: Christena Deem, MD;  Location: [facility] ENDOSCOPY;  Service: Endoscopy;  Laterality: N/A;   SHOULDER SURGERY       reports that she has never smoked. She has never used smokeless tobacco. She reports that she does not drink alcohol and does not use drugs.  Allergies  Allergen Reactions   Aspirin Other (See Comments)    dizziness   Hctz [Hydrochlorothiazide] Other (See Comments)    cramping   Lisinopril Cough    Family History  Problem Relation Age of Onset   CAD Father    Diabetes Mellitus II Mother    Stroke Mother     Prior to Admission medications   Medication Sig Start Date End Date Taking? Authorizing Provider  acetaminophen (TYLENOL) 325 MG tablet Take 650 mg by mouth every 4 (four) hours as needed. 12/26/21   [provider]  atorvastatin (LIPITOR) 20 MG tablet Take 20 mg by mouth at bedtime. 03/24/21   [provider]  Baclofen 5 MG TABS Take 1 tablet by mouth 3 (three) times daily. 0900/1400/2100 05/24/22   [provider]  bisacodyl (  DULCOLAX) 10 MG suppository Place 1 suppository rectally at bedtime.    [provider]  carvedilol (COREG) 12.5 MG tablet Take 1 tablet (12.5 mg total) by mouth 2 (two) times daily. 02/05/22 06/10/22  Delma Freeze, FNP  divalproex (DEPAKOTE SPRINKLE) 125 MG capsule Take 125 mg by mouth 2 (two) times daily. 03/25/21   [provider]  DULoxetine (CYMBALTA) 30 MG capsule Take 60 mg by mouth every morning.    [provider]  furosemide (LASIX) 40 MG tablet Take 40 mg by mouth daily. And additional  QPM PRN for edema or  shortness of breath    [provider]  gabapentin (NEURONTIN) 300 MG capsule Take 300 mg by mouth at bedtime.    [provider]  insulin glargine (LANTUS) 100 UNIT/ML injection Inject 10 Units into the skin at bedtime.    [provider]  insulin lispro (HUMALOG) 100 UNIT/ML injection Inject 5 Units into the skin 3 (three) times daily before meals. 0800/1300/1700    [provider]  latanoprost (XALATAN) 0.005 % ophthalmic solution Place 1 drop into both eyes at bedtime. 02/17/21   [provider]  levothyroxine (SYNTHROID) 150 MCG tablet Take 150 mcg by mouth daily. 03/15/21   [provider]  melatonin 5 MG TABS Take 10 mg by mouth at bedtime.    [provider]  metFORMIN (GLUCOPHAGE) 500 MG tablet Take 500 mg by mouth daily.    [provider]  nitrofurantoin, macrocrystal-monohydrate, (MACROBID) 100 MG capsule Take 1 capsule (100 mg total) by mouth every 12 (twelve) hours. 06/14/22   Azucena Fallen, MD  polyethylene glycol powder (GLYCOLAX/MIRALAX) 17 GM/SCOOP powder Take 17 g by mouth daily.    [provider]  sacubitril-valsartan (ENTRESTO) 24-26 MG Take 1 tablet by mouth 2 (two) times daily. 09/07/21   Delma Freeze, FNP  senna (SENOKOT) 8.6 MG TABS tablet Take 2 tablets by mouth 2 (two) times daily. 05/22/22   [provider]  sertraline (ZOLOFT) 25 MG tablet Take 25 mg by mouth daily.    [provider]  vitamin B-12 (CYANOCOBALAMIN) 500 MCG tablet Take 1 tablet by mouth daily with breakfast.    [provider]  Zinc Oxide 10 % OINT Apply 1 Application topically in the morning, at noon, and at bedtime. To groin area    [provider]    Physical Exam: Vitals:   11/16/22 1900 11/16/22 1930 11/16/22 1940 11/16/22 2000  BP: (!) 95/54 (!) 102/56 (!) 98/52 (!) 109/54  Pulse: 88 88 89 92  Resp: Temp:      TempSrc:      SpO2: 100% 100% 100% 100%  Height:         Constitutional: NAD, calm, comfortable nonverbal but does nod head yes and no Vitals:   11/16/22 1900 11/16/22 1930 11/16/22 1940 11/16/22 2000  BP: (!) 95/54 (!) 102/56 (!) 98/52 (!) 109/54  Pulse: 88 88 89 92  Resp: Temp:      TempSrc:      SpO2: 100% 100% 100% 100%  Height:       Eyes: PERRL, lids and conjunctivae normal ENMT: Mucous membranes are dry,  Posterior pharynx clear of any exudate or lesions.poor dentition. + thrush Neck: normal, supple, no masses, no thyromegaly Respiratory: clear to auscultation bilaterally, no wheezing, no crackles. Normal respiratory effort. No accessory muscle use.  Cardiovascular: Regular rate and rhythm, no murmurs /  rubs / gallops. No extremity edema. 2+ pedal pulses. Abdomen: no tenderness, no masses palpated. No hepatosplenomegaly. Bowel sounds positive.  Musculoskeletal: no clubbing / cyanosis. No joint deformity upper and lower extremities. Good ROM, no contractures. Normal muscle tone.  Skin: no rashes, lesions, ulcers. No induration Neurologic: CN 2-12 grossly intact. Sensation intact,.functional paraplegia 2/5 in all 4.  Psychiatric: . Alert unable to assess orientation. Calm no agitation   Labs on Admission: I have personally reviewed following labs and imaging studies  CBC: Recent Labs  Lab 11/16/22 1711  WBC 27.8*  NEUTROABS 20.9*  HGB 11.9*  HCT 37.8  MCV 92.6  PLT 256   Basic Metabolic Panel: Recent Labs  Lab 11/16/22 1711  NA 138  K 2.5*  CL 95*  CO2 32  GLUCOSE 146*  BUN 25*  CREATININE 0.64  CALCIUM 9.2  MG 2.1   GFR: CrCl cannot be calculated (Unknown ideal weight.). Liver Function Tests: Recent Labs  Lab 11/16/22 1711  AST 25  ALT 15  ALKPHOS 61  BILITOT 1.3*  PROT 7.7  ALBUMIN 2.8*   No results for input(s): "LIPASE", "AMYLASE" in the last 168 hours. No results for input(s): "AMMONIA" in the last 168 hours. Coagulation Profile: No results for input(s): "INR", "PROTIME" in the  last 168 hours. Cardiac Enzymes: No results for input(s): "CKTOTAL", "CKMB", "CKMBINDEX", "TROPONINI" in the last 168 hours. BNP (last 3 results) No results for input(s): "PROBNP" in the last 8760 hours. HbA1C: No results for input(s): "HGBA1C" in the last 72 hours. CBG: No results for input(s): "GLUCAP" in the last 168 hours. Lipid Profile: No results for input(s): "CHOL", "HDL", "LDLCALC", "TRIG", "CHOLHDL", "LDLDIRECT" in the last 72 hours. Thyroid Function Tests: No results for input(s): "TSH", "T4TOTAL", "FREET4", "T3FREE", "THYROIDAB" in the last 72 hours. Anemia Panel: No results for input(s): "VITAMINB12", "FOLATE", "FERRITIN", "TIBC", "IRON", "RETICCTPCT" in the last 72 hours. Urine analysis:    Component Value Date/Time   COLORURINE AMBER (A) 11/16/2022 1805   APPEARANCEUR CLOUDY (A) 11/16/2022 1805   APPEARANCEUR Hazy 01/12/2013 1713   LABSPEC 1.023 11/16/2022 1805   LABSPEC 1.010 01/12/2013 1713   PHURINE 5.0 11/16/2022 1805   GLUCOSEU NEGATIVE 11/16/2022 1805   GLUCOSEU Negative 01/12/2013 1713   HGBUR LARGE (A) 11/16/2022 1805   BILIRUBINUR NEGATIVE 11/16/2022 1805   BILIRUBINUR Negative 01/12/2013 1713   KETONESUR NEGATIVE 11/16/2022 1805   PROTEINUR 100 (A) 11/16/2022 1805   NITRITE NEGATIVE 11/16/2022 1805   LEUKOCYTESUR LARGE (A) 11/16/2022 1805   LEUKOCYTESUR Negative 01/12/2013 1713    Radiological Exams on Admission: CT Chest Wo Contrast  Result Date: 11/16/2022 CLINICAL DATA:  Left lower lobe infiltrate seen in chest radiographs EXAM: CT CHEST WITHOUT CONTRAST TECHNIQUE: Multidetector CT imaging of the chest was performed following the standard protocol without IV contrast. RADIATION DOSE REDUCTION: This exam was performed according to the departmental dose-optimization program which includes automated exposure control, adjustment of the mA and/or kV according to patient size and/or use of iterative reconstruction technique. COMPARISON:  CT done on  06/17/2022, chest radiograph done today FINDINGS: Cardiovascular: Scattered coronary artery calcifications are seen. Scattered calcifications are seen in thoracic aorta. There is ectasia of main pulmonary artery measuring 3.5 cm suggesting possible pulmonary arterial hypertension. Mediastinum/Nodes: No significant lymphadenopathy is seen. Lungs/Pleura: Left hemidiaphragm is elevated. There is patchy infiltrate in posterior aspects of both lower lung fields, more so on the left side. This finding has not changed significantly since 06/17/2022. In image 58 of series 4, 3  mm nodule is seen in left upper lobe which appears stable. Small linear density in the medial right upper lung fields has not changed. There is no pleural effusion or pneumothorax. Upper Abdomen: There is high density in the lumen of gallbladder suggesting presence of sludge and stones. Small hiatal hernia is seen. Musculoskeletal: No acute findings are seen. IMPRESSION: There are linear patchy infiltrates in both lower lung fields, more so in left lower lung fields suggesting atelectasis. Similar finding was seen in previous CT done on 06/17/2022. No new focal infiltrates are seen. There is no significant pleural effusion or pneumothorax. Coronary artery disease. Thoracic aortic atherosclerosis. There is ectasia of main pulmonary artery suggesting pulmonary arterial hypertension. Gallbladder stones.  Small hiatal hernia. Electronically Signed   By: Ernie Avena M.D.   On: 11/16/2022 19:57   CT Head Wo Contrast  Result Date: 11/16/2022 CLINICAL DATA:  Altered mental status EXAM: CT HEAD WITHOUT CONTRAST TECHNIQUE: Contiguous axial images were obtained from the base of the skull through the vertex without intravenous contrast. RADIATION DOSE REDUCTION: This exam was performed according to the departmental dose-optimization program which includes automated exposure control, adjustment of the mA and/or kV according to patient size and/or use  of iterative reconstruction technique. COMPARISON:  CT done on 06/10/2022, MR brain done on 06/10/2022 FINDINGS: Brain: No acute intracranial findings are seen. There are no signs of bleeding within the cranium. There is dilation of third and both lateral ventricles out of proportion to cortical atrophy. Fourth ventricle is unremarkable. There is decreased density in periventricular and subcortical white matter in both cerebral hemispheres. Vascular: Unremarkable. Skull: Unremarkable. Sinuses/Orbits: Unremarkable. Other: No significant interval changes are noted. IMPRESSION: No acute intracranial findings are seen. 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 No significant interval changes are noted since 06/10/2022. Electronically Signed   By: Ernie Avena M.D.   On: 11/16/2022 19:41   DG Chest 1 View  Result Date: 11/16/2022 CLINICAL DATA:  Altered mental status.  Failure to thrive. EXAM: CHEST  1 VIEW COMPARISON:  One-view chest x-ray 06/16/2022 FINDINGS: Heart is mildly enlarged. Atherosclerotic changes are present at the aortic arch. Lung volumes are low. Left lower lobe opacity noted. Mild pulmonary vascular congestion is present. No other focal airspace disease is present. Postoperative changes again noted in the proximal left humerus. IMPRESSION: 1. Low lung volumes. 2. Left lower lobe opacity likely reflects partial collapse of the lower lobe. Airspace disease is considered less likely. Electronically Signed   By: Marin Roberts M.D.   On: 11/16/2022 18:04    EKG: Independently reviewed.  Assessment/Plan  UTI  Sepsis  -admit to progressive care  - cont ctx  - f/u on blood/ urine cultures  -supportive care with ivfs  -check inflammatory markers /lactic acid    ALS  -exacerbation of symptoms due to acute illness  -continue on on bipap qhs  Anxiety/depression -resume home  regimen once patient pass speech and swallow    CHFpef  - no acute exacerbation    DMII -last  A1c 9.5 -resume lantus -monitor on fs/ iss    HLD -resume as able based on swallow evaluation    Chronic hypotension -baseline systolic 90's  -continue to monitor    Thyroid disease -check tsh    DVT prophylaxis: heparin Code Status: Family Communication:  Disposition Plan: patient  expected to be admitted greater than 2 midnights  Consults called: n/a Admission  status: progressive care   Lurline Del MD [facility]   If 7PM-7AM, please contact night-coverage [email or web address] Password [facility]  11/16/2022, 8:36 PM

## 2022-11-16 NOTE — ED Triage Notes (Signed)
First Nurse Note:  Pt via EMS from [facility]. Pt is non-verbal at bedside. Per sister, pt here for failure to thrive since last night. States she isn't moving around as much as she normally does per sister. Pt has a hx of ALS.  99% on chronic 4L  Per staff, pt has a hx of hypotension normal for her to be in the 80s/90s.  98.6 orally  139 CBG

## 2022-11-16 NOTE — ED Notes (Signed)
Per facility paperwork, noted fever at 230pm of 99.9 and rectal tylenol was admin at 230pm today.

## 2022-11-17 DIAGNOSIS — G1221 Amyotrophic lateral sclerosis: Secondary | ICD-10-CM | POA: Diagnosis not present

## 2022-11-17 DIAGNOSIS — I5032 Chronic diastolic (congestive) heart failure: Secondary | ICD-10-CM

## 2022-11-17 DIAGNOSIS — N390 Urinary tract infection, site not specified: Secondary | ICD-10-CM | POA: Diagnosis not present

## 2022-11-17 DIAGNOSIS — E876 Hypokalemia: Secondary | ICD-10-CM | POA: Diagnosis not present

## 2022-11-17 DIAGNOSIS — E785 Hyperlipidemia, unspecified: Secondary | ICD-10-CM | POA: Diagnosis present

## 2022-11-17 LAB — CBC
HCT: 33.8 % — ABNORMAL LOW (ref 36.0–46.0)
Hemoglobin: 10.4 g/dL — ABNORMAL LOW (ref 12.0–15.0)
MCH: 29.1 pg (ref 26.0–34.0)
MCHC: 30.8 g/dL (ref 30.0–36.0)
MCV: 94.7 fL (ref 80.0–100.0)
Platelets: 210 10*3/uL (ref 150–400)
RBC: 3.57 MIL/uL — ABNORMAL LOW (ref 3.87–5.11)
RDW: 13.2 % (ref 11.5–15.5)
WBC: 26.8 10*3/uL — ABNORMAL HIGH (ref 4.0–10.5)
nRBC: 0 % (ref 0.0–0.2)

## 2022-11-17 LAB — CBG MONITORING, ED
Glucose-Capillary: 96 mg/dL (ref 70–99)
Glucose-Capillary: 112 mg/dL — ABNORMAL HIGH (ref 70–99)
Glucose-Capillary: 118 mg/dL — ABNORMAL HIGH (ref 70–99)
Glucose-Capillary: 99 mg/dL (ref 70–99)

## 2022-11-17 LAB — COMPREHENSIVE METABOLIC PANEL
ALT: 10 U/L (ref 0–44)
AST: 27 U/L (ref 15–41)
Albumin: 2.4 g/dL — ABNORMAL LOW (ref 3.5–5.0)
Alkaline Phosphatase: 65 U/L (ref 38–126)
Anion gap: 9 (ref 5–15)
BUN: 25 mg/dL — ABNORMAL HIGH (ref 8–23)
CO2: 27 mmol/L (ref 22–32)
Calcium: 8.5 mg/dL — ABNORMAL LOW (ref 8.9–10.3)
Chloride: 101 mmol/L (ref 98–111)
Creatinine, Ser: 0.4 mg/dL — ABNORMAL LOW (ref 0.44–1.00)
GFR, Estimated: >60 mL/min (ref >60)
Glucose, Bld: 118 mg/dL — ABNORMAL HIGH (ref 70–99)
Potassium: 5.3 mmol/L — ABNORMAL HIGH (ref 3.5–5.1)
Sodium: 137 mmol/L (ref 135–145)
Total Bilirubin: 1.4 mg/dL — ABNORMAL HIGH (ref 0.3–1.2)
Total Protein: 6.7 g/dL (ref 6.5–8.1)

## 2022-11-17 LAB — POTASSIUM: Potassium: 3 mmol/L — ABNORMAL LOW (ref 3.5–5.1)

## 2022-11-17 MED ORDER — SACUBITRIL-VALSARTAN 49-51 MG PO TABS
0.5000 | ORAL_TABLET | Freq: Two times a day (BID) | ORAL | Status: DC
  Administered 2022-11-17 – 2022-11-22 (×10): 0.5 via ORAL
  Filled 2022-11-17: qty 1
  Filled 2022-11-17: qty 0.5
  Filled 2022-11-17: qty 1
  Filled 2022-11-17: qty 0.5
  Filled 2022-11-17 (×2): qty 1
  Filled 2022-11-17: qty 0.5
  Filled 2022-11-17 (×4): qty 1

## 2022-11-17 MED ORDER — LACTATED RINGERS IV SOLN: INTRAVENOUS | Status: DC

## 2022-11-17 MED ORDER — POTASSIUM CHLORIDE 2 MEQ/ML IV SOLN
INTRAVENOUS | Status: DC
  Filled 2022-11-17 (×6): qty 1000

## 2022-11-17 NOTE — ED Notes (Signed)
DNR bracelet applied to left wrist

## 2022-11-17 NOTE — Assessment & Plan Note (Signed)
K 2.5 on admission was aggressively replaced. K 5.3 yesterday AM, hemolyzed sample, repeat was 3.0 K 3.1 this AM --Ongoing replacement with IV fluid additive and PO today --BMP in AM

## 2022-11-17 NOTE — Assessment & Plan Note (Addendum)
Anxiety and Deperssion Continue home Cymbalta, Zoloft, Depakote

## 2022-11-17 NOTE — Assessment & Plan Note (Signed)
With baseline soft BP's noted on admission. Continue Coreg, Entresto (hold if MAP<65)

## 2022-11-17 NOTE — Assessment & Plan Note (Signed)
Continue statin. 

## 2022-11-17 NOTE — Progress Notes (Signed)
Progress Note   Patient: Cynthia Landry UUE:[id number] DOB: Jun 12, 1956 DOA: 11/16/2022     1 DOS: the patient was seen and examined on 11/17/2022   Brief hospital course:  Cynthia Landry is a 66 y.o. female with medical history significant of  ALS on bipap qhs, chronic O2 , anxiety/depression, CHF, DMII, HLD, HTN, Thyroid disease, Ataxia,vitamin D def, who presented on 11/16/22 via EMS due to 2 days of decrease po intake and noted to be lethargic at SNF.     ED Course: febrile with Tmax 100.5, BP 94/52-84/55 (107/58),  HR 90, RR 16, O2 sat 99% on 4L.  Labs notable for K 2.5 and urinalysis was consistent with infection.  Patient was admitted to the hospital and started on empiric IV Rocephin pending urine culture results.    Assessment and Plan: * Complicated UTI (urinary tract infection) Severe Sepsis on admission as evidenced by fever, leukocytosis, intermittent tachycardia.  Mental status changes (lethargy) consistent with organ dysfunction and severe sepsis. Sepsis physiology improved. --Continue empiric IV Rocephin --Follow up urine and blood cultures  AMS (altered mental status) Due to UTI with sepsis most likely. Treat infection as outlined. Delirium precautions.  Hyperlipidemia Continue statin  Hypokalemia K 2.5 on admission was aggressively replaced. K 5.3 this AM, mildly elevated but ? Hemolyzed sample. --Repeat K level this afternoon --BMP in AM  Chronic heart failure with preserved ejection fraction (HFpEF) (HCC) Appears overall euvolemic. Continue Coreg, Entresto  COPD (chronic obstructive pulmonary disease) (HCC) Not acutely exacerbated. Continue PRN bronchodilators.  Diabetes mellitus without complication (HCC) Last A1c 9.5, poorly controlled. Sliding scale Novolog and Semglee basal insulin.  Hypertension, benign With baseline soft BP's noted on admission. Continue Coreg, Entresto (hold if MAP<65)  Hypothyroidism Continue  Synthroid  Depression Anxiety and Deperssion Continue home Cymbalta, Zoloft, Depakote  ALS (amyotrophic lateral sclerosis) (HCC) Resides at SNF. Continue BiPAP overnight. Continue home Depakote, baclofen, gabapentin        Subjective: Pt seen in the ED with two sisters present.  Pt denies acute complaints at this time.  She is minimally verbal, unclear if at her baseline.  Denies pain, SOB, N/V or F/C.  Poor appetite, breakfast tray appears untouched.    Physical Exam: Vitals:   11/17/22 0900 11/17/22 1000 11/17/22 1113 11/17/22 1209  BP: 128/67 (!) 122/58 (!) 117/57   Pulse: 100  90   Resp:   20   Temp:    99.3 F (37.4 C)  TempSrc:    Oral  SpO2: 100%  100%   Height:       General exam: awake, alert, no acute distress HEENT: moist mucus membranes, hearing grossly normal  Respiratory system: CTAB diminished bases due to poor inspirations, no wheezes, rales or rhonchi, normal respiratory effort. Cardiovascular system: normal S1/S2, RRR, no JVD, murmurs, rubs, gallops, no pedal edema.   Gastrointestinal system: soft, NT, ND Central nervous system: follows commands, does not move lower extremities, minimally verbal with poor vocal projection Extremities: moves all, no edema, normal tone Skin: dry, intact, normal temperature Psychiatry: normal mood, congruent affect   Data Reviewed:  Notable labs --- K 2.5 >> 5.3 (?hemolyzed), glucose 118, BUN 25, Cr 0.40, Ca 8.5, albumin 2.4, WBC 26.8 from 27.8, Hbg 10.4 from 11.9   Family Communication: multiple at bedside on rounds  Disposition: Status is: Inpatient Remains inpatient appropriate because: remains on IV antibiotics pending cultures     Planned Discharge Destination: Skilled nursing facility    Time spent: 45 minutes  Author: Pennie Banter, DO 11/17/2022 2:17 PM  For on call review [email or web address].

## 2022-11-17 NOTE — ED Notes (Signed)
Pt had large loose BM with urine in brief. This Rn and second RN changed pt's brief and bed sheets. Performed pericare

## 2022-11-17 NOTE — ED Notes (Signed)
Hospice nurse called and given update concerning pt condition

## 2022-11-17 NOTE — ED Notes (Signed)
Changed pt's brief. Stool and urine occurrence noted.

## 2022-11-17 NOTE — Assessment & Plan Note (Signed)
Due to UTI with sepsis most likely. Treat infection as outlined. Delirium precautions.

## 2022-11-17 NOTE — ED Notes (Signed)
This RN attempted IV 1x and unsuccessful

## 2022-11-17 NOTE — ED Notes (Signed)
Pt taken off Bipap and placed on 3L [place]. Pt tolerating [place] well, O2sat at 100%. Pt given water and orange juice. Some meds given crushed with applesauce and pt tolerated well.

## 2022-11-17 NOTE — ED Notes (Signed)
Pt groaning and crying about 15 min ago, this RN administered Tramadol. Family reported she was having muscle spasms. This RN and paramedic cleaned pt after small diarrhea. Barrier cream applied and pillow under right side. Pt resting in bed comfortably.

## 2022-11-17 NOTE — Assessment & Plan Note (Addendum)
Severe Sepsis on admission as evidenced by fever, leukocytosis, intermittent tachycardia.  Mental status changes (lethargy) consistent with organ dysfunction and severe sepsis. Sepsis physiology improved. Metabolic encephalopathy due to sepsis and UTI - Resolved. --Initially on empiric IV Rocephin --Urine culture back with ESBL E coli --Change antibiotic to meropenem

## 2022-11-17 NOTE — Hospital Course (Signed)
Cynthia Landry is a 66 y.o. female with medical history significant of  ALS on bipap qhs, chronic O2 , anxiety/depression, CHF, DMII, HLD, HTN, Thyroid disease, Ataxia,vitamin D def, who presented on 11/16/22 via EMS due to 2 days of decrease po intake and noted to be lethargic at SNF.     ED Course: febrile with Tmax 100.5, BP 94/52-84/55 (107/58),  HR 90, RR 16, O2 sat 99% on 4L.  Labs notable for K 2.5 and urinalysis was consistent with infection.  Patient was admitted to the hospital and started on empiric IV Rocephin pending urine culture results.

## 2022-11-17 NOTE — Assessment & Plan Note (Signed)
Continue Synthroid °

## 2022-11-17 NOTE — Assessment & Plan Note (Addendum)
Last A1c 9.5, poorly controlled. Sliding scale Novolog and Semglee basal insulin.

## 2022-11-17 NOTE — Assessment & Plan Note (Signed)
Not acutely exacerbated. Continue PRN bronchodilators.

## 2022-11-17 NOTE — Assessment & Plan Note (Signed)
Appears overall euvolemic. Continue Coreg, Sherryll Burger

## 2022-11-17 NOTE — Assessment & Plan Note (Addendum)
Resides at [facility]. Continue BiPAP overnight. Continue home Depakote, baclofen, gabapentin

## 2022-11-18 ENCOUNTER — Telehealth: Payer: Self-pay

## 2022-11-18 ENCOUNTER — Encounter: Payer: Self-pay | Admitting: Internal Medicine

## 2022-11-18 ENCOUNTER — Other Ambulatory Visit: Payer: Self-pay

## 2022-11-18 DIAGNOSIS — N390 Urinary tract infection, site not specified: Secondary | ICD-10-CM | POA: Diagnosis not present

## 2022-11-18 DIAGNOSIS — I5032 Chronic diastolic (congestive) heart failure: Secondary | ICD-10-CM | POA: Diagnosis not present

## 2022-11-18 DIAGNOSIS — E876 Hypokalemia: Secondary | ICD-10-CM | POA: Diagnosis not present

## 2022-11-18 DIAGNOSIS — G1221 Amyotrophic lateral sclerosis: Secondary | ICD-10-CM | POA: Diagnosis not present

## 2022-11-18 LAB — BASIC METABOLIC PANEL
Anion gap: 8 (ref 5–15)
BUN: 14 mg/dL (ref 8–23)
CO2: 30 mmol/L (ref 22–32)
Calcium: 8.8 mg/dL — ABNORMAL LOW (ref 8.9–10.3)
Chloride: 99 mmol/L (ref 98–111)
Creatinine, Ser: <0.3 mg/dL — ABNORMAL LOW (ref 0.44–1.00)
Glucose, Bld: 88 mg/dL (ref 70–99)
Potassium: 3.1 mmol/L — ABNORMAL LOW (ref 3.5–5.1)
Sodium: 137 mmol/L (ref 135–145)

## 2022-11-18 LAB — CBC
HCT: 33.6 % — ABNORMAL LOW (ref 36.0–46.0)
Hemoglobin: 10.4 g/dL — ABNORMAL LOW (ref 12.0–15.0)
MCH: 29 pg (ref 26.0–34.0)
MCHC: 31 g/dL (ref 30.0–36.0)
MCV: 93.6 fL (ref 80.0–100.0)
Platelets: 256 10*3/uL (ref 150–400)
RBC: 3.59 MIL/uL — ABNORMAL LOW (ref 3.87–5.11)
RDW: 13.1 % (ref 11.5–15.5)
WBC: 23 10*3/uL — ABNORMAL HIGH (ref 4.0–10.5)
nRBC: 0 % (ref 0.0–0.2)

## 2022-11-18 LAB — GLUCOSE, CAPILLARY
Glucose-Capillary: 102 mg/dL — ABNORMAL HIGH (ref 70–99)
Glucose-Capillary: 152 mg/dL — ABNORMAL HIGH (ref 70–99)

## 2022-11-18 LAB — MAGNESIUM: Magnesium: 1.8 mg/dL (ref 1.7–2.4)

## 2022-11-18 LAB — CBG MONITORING, ED
Glucose-Capillary: 88 mg/dL (ref 70–99)
Glucose-Capillary: 97 mg/dL (ref 70–99)

## 2022-11-18 LAB — PHOSPHORUS: Phosphorus: 2.1 mg/dL — ABNORMAL LOW (ref 2.5–4.6)

## 2022-11-18 MED ORDER — POTASSIUM CHLORIDE CRYS ER 20 MEQ PO TBCR
40.0000 meq | EXTENDED_RELEASE_TABLET | Freq: Once | ORAL | Status: AC
  Administered 2022-11-18: 40 meq via ORAL
  Filled 2022-11-18: qty 2

## 2022-11-18 MED ORDER — SODIUM CHLORIDE 0.9 % IV SOLN: INTRAVENOUS | Status: DC | PRN

## 2022-11-18 MED ORDER — POTASSIUM PHOSPHATES 15 MMOLE/5ML IV SOLN
15.0000 mmol | Freq: Once | INTRAVENOUS | Status: AC
  Administered 2022-11-18: 15 mmol via INTRAVENOUS
  Filled 2022-11-18: qty 5

## 2022-11-18 NOTE — Assessment & Plan Note (Signed)
Phos 2.1.  IV K-phos ordered for replacement. Repeat phos level in AM

## 2022-11-18 NOTE — Progress Notes (Addendum)
Progress Note   Patient: Cynthia Landry ZHG:[id number] DOB: Sep 16, 1956 DOA: 11/16/2022     2 DOS: the patient was seen and examined on 11/18/2022   Brief hospital course:  Cynthia Landry is a 67 y.o. female with medical history significant of  ALS on bipap qhs, chronic O2 , anxiety/depression, CHF, DMII, HLD, HTN, Thyroid disease, Ataxia,vitamin D def, who presented on 11/16/22 via EMS due to 2 days of decrease po intake and noted to be lethargic at SNF.     ED Course: febrile with Tmax 100.5, BP 94/52-84/55 (107/58),  HR 90, RR 16, O2 sat 99% on 4L.  Labs notable for K 2.5 and urinalysis was consistent with infection.  Patient was admitted to the hospital and started on empiric IV Rocephin pending urine culture results.    Assessment and Plan: * Complicated UTI (urinary tract infection) Severe Sepsis on admission as evidenced by fever, leukocytosis, intermittent tachycardia.  Mental status changes (lethargy) consistent with organ dysfunction and severe sepsis. Sepsis physiology improved. --Continue empiric IV Rocephin --Follow up urine and blood cultures Urine cultures growing E. Coli --Follow susceptibilities  AMS (altered mental status) Due to UTI with sepsis most likely. Treat infection as outlined. Delirium precautions.  Hypophosphatemia Phos 2.1.  IV K-phos ordered for replacement. Repeat phos level in AM  Hyperlipidemia Continue statin  Hypokalemia K 2.5 on admission was aggressively replaced. K 5.3 yesterday AM, hemolyzed sample, repeat was 3.0 K 3.1 this AM --Ongoing replacement with IV fluid additive and PO today --BMP in AM  Chronic heart failure with preserved ejection fraction (HFpEF) (HCC) Appears overall euvolemic. Continue Coreg, Entresto  COPD (chronic obstructive pulmonary disease) (HCC) Not acutely exacerbated. Continue PRN bronchodilators.  Diabetes mellitus without complication (HCC) Last H9Q 9.5, poorly controlled. Sliding scale Novolog and  Semglee basal insulin.  Hypertension, benign With baseline soft BP's noted on admission. Continue Coreg, Entresto (hold if MAP<65)  Hypothyroidism Continue Synthroid  Depression Anxiety and Deperssion Continue home Cymbalta, Zoloft, Depakote  ALS (amyotrophic lateral sclerosis) (HCC) Resides at SNF. Continue BiPAP overnight. Continue home Depakote, baclofen, gabapentin        Subjective: Pt sleeping, still in ED holding for a bed. Sister at bedside.  She reports patient was awake earlier, talking again, and actually ate breakfast earlier.  Notes significant improvement since yesterday, pt more like her normal self today. Pt remains sleeping but does answer yes/no questions.  Denies feeling sick or having pain.    Physical Exam: Vitals:   11/18/22 1330 11/18/22 1500 11/18/22 1515 11/18/22 1530  BP: (!) 131/57 134/66  110/68  Pulse: 71 68 70 67  Resp:  18    Temp:    99.1 F (37.3 C)  TempSrc:    Rectal  SpO2: 100% 100% 100% 100%  Weight:      Height:       General exam: sleeping but responds to voice, no acute distress HEENT: moist mucus membranes, hearing grossly normal  Respiratory system: CTAB diminished bases due to poor inspirations, no wheezes, rales or rhonchi, normal respiratory effort. Cardiovascular system: normal S1/S2, RRR   Gastrointestinal system: soft, NT, ND Central nervous system: limited exam due to somnolence, follows commands Extremities: moves all, no edema, normal tone Skin: dry, intact, normal temperature Psychiatry: unable to assess due to somnolence   Data Reviewed:  Notable labs --- K 3.1, Cr <0.30, Ca 8.8, albumin 2.1.  WBC improving 23.0 from 26.8, Hbg stable 10.4    Urine culture growing E coli. Susceptibilities  pending.   Family Communication: sister at bedside on rounds  Disposition: Status is: Inpatient Remains inpatient appropriate because: remains on IV antibiotics pending cultures     Planned Discharge Destination:  Skilled nursing facility    Time spent: 35 minutes  Author: Ezekiel Slocumb, DO 11/18/2022 4:34 PM  For on call review [email or web address].

## 2022-11-18 NOTE — Progress Notes (Signed)
Pt placed on contact isolation for history of ESBL per protocol. Will refer to ID for removal

## 2022-11-18 NOTE — Progress Notes (Signed)
ED 07 [facility]) Hospital Liaison Note  This is a current hospice patient who resides at [facility], followed by [facility] with a terminal diagnosis of ALS.  Patient was sent to the ED 12.30.23 at daughter's request for marked decline and not eating.  Patient was evaluated in the ED and was admitted to [facility] on 12.30.23 for hypokalemia and sepsis related to UTI.  Per Dr. Gildardo Cranker, this is a related hospital admission.  Phone call with daughter, Cynthia Landry, this morning who reports patient ate all of her breakfast tray this morning, which is a marked improvement from days prior.  Patient remains GIP appropriate due to the need for IV antibiotics to treat complicated UTI and correction of electrolyte imbalance with IVF and K+ repletion.  Vital Signs: 131/57, 71, 16, 100% Sats on 3 lpm [place]  I&O: Not followed  Abnormal Labs:  11/18/22 03:42 Potassium: 3.1 (L) Creatinine: <0.30 (L) Calcium: 8.8 (L) Phosphorus: 2.1 (L) WBC: 23.0 (H) RBC: 3.59 (L) Hemoglobin: 10.4 (L) HCT: 33.6 (L)  Diagnostics: None new  IV/PRN meds: cefTRIAXone (ROCEPHIN) 2 g in sodium chloride 0.9 % 100 mL IVPB  lactated ringers 1,000 mL with potassium chloride 20 mEq infusion  Assessment and Plan: * Complicated UTI (urinary tract infection) Severe Sepsis on admission as evidenced by fever, leukocytosis, intermittent tachycardia.  Mental status changes (lethargy) consistent with organ dysfunction and severe sepsis. Sepsis physiology improved. --Continue empiric IV Rocephin --Follow up urine and blood cultures   AMS (altered mental status) Due to UTI with sepsis most likely. Treat infection as outlined. Delirium precautions.   Hyperlipidemia Continue statin   Hypokalemia K 2.5 on admission was aggressively replaced. K 5.3 this AM, mildly elevated but ? Hemolyzed sample. --Repeat K level this afternoon --BMP in AM   Chronic heart failure with preserved ejection  fraction (HFpEF) (HCC) Appears overall euvolemic. Continue Coreg, Entresto   COPD (chronic obstructive pulmonary disease) (HCC) Not acutely exacerbated. Continue PRN bronchodilators.   Diabetes mellitus without complication (HCC) Last E3O 9.5, poorly controlled. Sliding scale Novolog and Semglee basal insulin.   Hypertension, benign With baseline soft BP's noted on admission. Continue Coreg, Entresto (hold if MAP<65)   Hypothyroidism Continue Synthroid   Depression Anxiety and Deperssion Continue home Cymbalta, Zoloft, Depakote   ALS (amyotrophic lateral sclerosis) (HCC) Resides at SNF. Continue BiPAP overnight. Continue home Depakote, baclofen, gabapentin  Discharge Planning: Ongoing, likely to return to [facility] with the support of hospice services when medically optimized.  Family contact: Spoke with daughter Cynthia Landry by phone  IDT: updated  Goals of Care: Clear, remains DNR but treat UTI for focus on comfort  Please call with any hospice related questions or concerns.  Thank you, Cynthia Landry, BSN, RN [facility] Liaison [phone]

## 2022-11-18 NOTE — Progress Notes (Signed)
Spoke to patient regarding urine cultures

## 2022-11-19 ENCOUNTER — Encounter: Payer: Self-pay | Admitting: Internal Medicine

## 2022-11-19 DIAGNOSIS — N390 Urinary tract infection, site not specified: Secondary | ICD-10-CM

## 2022-11-19 DIAGNOSIS — R7989 Other specified abnormal findings of blood chemistry: Secondary | ICD-10-CM | POA: Insufficient documentation

## 2022-11-19 DIAGNOSIS — E876 Hypokalemia: Secondary | ICD-10-CM | POA: Diagnosis not present

## 2022-11-19 DIAGNOSIS — G1221 Amyotrophic lateral sclerosis: Secondary | ICD-10-CM

## 2022-11-19 DIAGNOSIS — I5032 Chronic diastolic (congestive) heart failure: Secondary | ICD-10-CM | POA: Diagnosis not present

## 2022-11-19 LAB — BASIC METABOLIC PANEL
Anion gap: 8 (ref 5–15)
BUN: 8 mg/dL (ref 8–23)
CO2: 32 mmol/L (ref 22–32)
Calcium: 8.7 mg/dL — ABNORMAL LOW (ref 8.9–10.3)
Chloride: 100 mmol/L (ref 98–111)
Creatinine, Ser: <0.3 mg/dL — ABNORMAL LOW (ref 0.44–1.00)
Glucose, Bld: 103 mg/dL — ABNORMAL HIGH (ref 70–99)
Potassium: 3.8 mmol/L (ref 3.5–5.1)
Sodium: 140 mmol/L (ref 135–145)

## 2022-11-19 LAB — HEMOGLOBIN A1C
Hgb A1c MFr Bld: 5.3 % (ref 4.8–5.6)
Mean Plasma Glucose: 105 mg/dL

## 2022-11-19 LAB — CBC
HCT: 32.9 % — ABNORMAL LOW (ref 36.0–46.0)
Hemoglobin: 10.1 g/dL — ABNORMAL LOW (ref 12.0–15.0)
MCH: 29.2 pg (ref 26.0–34.0)
MCHC: 30.7 g/dL (ref 30.0–36.0)
MCV: 95.1 fL (ref 80.0–100.0)
Platelets: 271 10*3/uL (ref 150–400)
RBC: 3.46 MIL/uL — ABNORMAL LOW (ref 3.87–5.11)
RDW: 13.2 % (ref 11.5–15.5)
WBC: 16.8 10*3/uL — ABNORMAL HIGH (ref 4.0–10.5)
nRBC: 0 % (ref 0.0–0.2)

## 2022-11-19 LAB — URINE CULTURE: Culture: >=100000 — AB

## 2022-11-19 LAB — GLUCOSE, CAPILLARY
Glucose-Capillary: 92 mg/dL (ref 70–99)
Glucose-Capillary: 125 mg/dL — ABNORMAL HIGH (ref 70–99)
Glucose-Capillary: 131 mg/dL — ABNORMAL HIGH (ref 70–99)
Glucose-Capillary: 153 mg/dL — ABNORMAL HIGH (ref 70–99)

## 2022-11-19 LAB — T4, FREE: Free T4: 1.5 ng/dL — ABNORMAL HIGH (ref 0.61–1.12)

## 2022-11-19 LAB — PHOSPHORUS: Phosphorus: 3.4 mg/dL (ref 2.5–4.6)

## 2022-11-19 LAB — MAGNESIUM: Magnesium: 1.9 mg/dL (ref 1.7–2.4)

## 2022-11-19 MED ORDER — SODIUM CHLORIDE 0.9 % IV SOLN
1.0000 g | Freq: Three times a day (TID) | INTRAVENOUS | Status: DC
  Administered 2022-11-19 – 2022-11-22 (×9): 1 g via INTRAVENOUS
  Filled 2022-11-19: qty 1
  Filled 2022-11-19: qty 20
  Filled 2022-11-19 (×4): qty 1
  Filled 2022-11-19 (×3): qty 20
  Filled 2022-11-19: qty 1
  Filled 2022-11-19: qty 20

## 2022-11-19 NOTE — NC FL2 (Signed)
[place] LEVEL OF CARE FORM     IDENTIFICATION  Patient Name: Cynthia Landry Birthdate: 1956/09/15 Sex: female Admission Date (Current Location): 11/16/2022  [facility] [place] Number:  Engineering geologist and Address:         Provider Number: [id number]  Attending Physician Name and Address:  Ezekiel Slocumb, DO  Relative Name and Phone Number:       Current Level of Care: Hospital Recommended Level of Care: Nursing Facility Prior Approval Number:    Date Approved/Denied:   PASRR Number: [id number]  Discharge Plan: SNF    Current Diagnoses: Patient Active Problem List   Diagnosis Date Noted   Abnormal TSH 11/19/2022   Hypophosphatemia 11/18/2022   Hyperlipidemia 96/29/5284   Complicated UTI (urinary tract infection) 11/16/2022   Hypokalemia 06/11/2022   UTI (urinary tract infection): pROBABLE 06/11/2022   Dehydration    Chronic heart failure with preserved ejection fraction (HFpEF) ([place]) 06/10/2022   GERD (gastroesophageal reflux disease) 06/10/2022   Left leg pain    Sinus tachycardia    Intractable pain, left leg 04/02/2021   Leukocytosis 04/02/2021   Diabetes mellitus without complication ([place]) 13/24/4010   COPD (chronic obstructive pulmonary disease) ([place]) 04/02/2021   Debility 02/24/2019   Multifactorial functional impairment 12/30/2018   Palliative care encounter 12/30/2018   Dysphagia 27/25/3664   Metabolic acidosis 40/34/7425   Depression 12/06/2013   ALS (amyotrophic lateral sclerosis) ([place]) 02/25/2013   Chronic hepatitis C ([place]) 06/07/2011   Hypertension, benign 02/27/2011   Hypothyroidism 10/23/2010    Orientation RESPIRATION BLADDER Height & Weight      (non verbal)  O2 (3L [place]) Incontinent Weight: 84.1 kg Height:  5\' 5"  (165.1 cm)  BEHAVIORAL SYMPTOMS/MOOD NEUROLOGICAL BOWEL NUTRITION STATUS      Incontinent Diet (dys 2)  AMBULATORY STATUS COMMUNICATION OF NEEDS Skin   Total Care Does not communicate Other  (Comment) (Dermatitis, small open area on buttocks)                       Personal Care Assistance Level of Assistance              Functional Limitations Info             SPECIAL CARE FACTORS FREQUENCY                       Contractures Contractures Info: Not present    Additional Factors Info  Code Status, Isolation Precautions, Allergies ([place]) Code Status Info: DNR Allergies Info: Aspirin, Hctz (Hydrochlorothiazide), Lisinopril     Isolation Precautions Info: ESBL     Current Medications (11/19/2022):  This is the current hospital active medication list Current Facility-Administered Medications  Medication Dose Route Frequency Provider Last Rate Last Admin   0.9 %  sodium chloride infusion   Intravenous PRN Nicole Kindred A, DO   Stopped at 11/18/22 2100   acetaminophen (TYLENOL) tablet 650 mg  650 mg Oral Q6H PRN Clance Boll, MD       Or   acetaminophen (TYLENOL) suppository 650 mg  650 mg Rectal Q6H PRN Clance Boll, MD       albuterol (PROVENTIL) (2.5 MG/3ML) 0.083% nebulizer solution 2.5 mg  2.5 mg Nebulization Q2H PRN Clance Boll, MD       baclofen (LIORESAL) tablet 5 mg  5 mg Oral TID Myles Rosenthal A, MD   5 mg at 11/19/22 1429   carvedilol (COREG) tablet  12.5 mg  12.5 mg Oral BID Myles Rosenthal A, MD   12.5 mg at 11/19/22 1003   cyanocobalamin (VITAMIN B12) tablet 500 mcg  500 mcg Oral Q breakfast Myles Rosenthal A, MD   500 mcg at 11/19/22 1003   divalproex (DEPAKOTE SPRINKLE) capsule 250 mg  250 mg Oral Daily Myles Rosenthal A, MD   250 mg at 11/19/22 1004   DULoxetine (CYMBALTA) DR capsule 60 mg  60 mg Oral Daily Myles Rosenthal A, MD   60 mg at 11/19/22 1003   gabapentin (NEURONTIN) capsule 300 mg  300 mg Oral QHS Myles Rosenthal A, MD   300 mg at 11/18/22 2116   heparin injection 5,000 Units  5,000 Units Subcutaneous Q8H Myles Rosenthal A, MD   5,000 Units at 11/19/22 1428   insulin aspart (novoLOG)  injection 0-9 Units  0-9 Units Subcutaneous TID WC Clance Boll, MD   1 Units at 11/19/22 1254   insulin glargine-yfgn (SEMGLEE) injection 10 Units  10 Units Subcutaneous QHS Myles Rosenthal A, MD   10 Units at 11/18/22 2131   latanoprost (XALATAN) 0.005 % ophthalmic solution 1 drop  1 drop Both Eyes QHS Clance Boll, MD       levothyroxine (SYNTHROID) tablet 150 mcg  150 mcg Oral Q0600 Myles Rosenthal A, MD   150 mcg at 11/19/22 1003   meropenem (MERREM) 1 g in sodium chloride 0.9 % 100 mL IVPB  1 g Intravenous Q8H Griffith, Kelly A, DO 200 mL/hr at 11/19/22 1257 1 g at 11/19/22 1257   nitroGLYCERIN (NITROSTAT) SL tablet 0.4 mg  0.4 mg Sublingual Q5 min PRN Clance Boll, MD       ondansetron [facility]) tablet 4 mg  4 mg Oral Q6H PRN Clance Boll, MD       Or   ondansetron [facility]) injection 4 mg  4 mg Intravenous Q6H PRN Myles Rosenthal A, MD       polyethylene glycol powder (GLYCOLAX/MIRALAX) container 17 g  17 g Oral Daily Myles Rosenthal A, MD   17 g at 11/19/22 1004   sacubitril-valsartan (ENTRESTO) 49-51 mg per tablet  0.5 tablet Oral BID Alison Murray, RPH   0.5 tablet at 11/19/22 1003   senna (SENOKOT) tablet 17.2 mg  2 tablet Oral BID Myles Rosenthal A, MD   17.2 mg at 11/19/22 1003   sertraline (ZOLOFT) tablet 25 mg  25 mg Oral Daily Myles Rosenthal A, MD   25 mg at 11/19/22 1003   traMADol (ULTRAM) tablet 50 mg  50 mg Oral Q12H PRN Clance Boll, MD   50 mg at 11/18/22 1725     Discharge Medications: Please see discharge summary for a list of discharge medications.  Relevant Imaging Results:  Relevant Lab Results:   Additional Information SS#: [id number]  Beverly Sessions, RN

## 2022-11-19 NOTE — Assessment & Plan Note (Signed)
TSH check on admission was low, 0.233. Check free T4, added on. Continue current dose levothyroxine for now.

## 2022-11-19 NOTE — Progress Notes (Addendum)
Progress Note   Patient: Cynthia Landry XNA:[id number] DOB: May 04, 1956 DOA: 11/16/2022     3 DOS: the patient was seen and examined on 11/19/2022   Brief hospital course:  LYNDIE VANDERLOOP is a 67 y.o. female with medical history significant of  ALS on bipap qhs, chronic O2 , anxiety/depression, CHF, DMII, HLD, HTN, Thyroid disease, Ataxia,vitamin D def, who presented on 11/16/22 via EMS due to 2 days of decrease po intake and noted to be lethargic at SNF.     ED Course: febrile with Tmax 100.5, BP 94/52-84/55 (107/58),  HR 90, RR 16, O2 sat 99% on 4L.  Labs notable for K 2.5 and urinalysis was consistent with infection.  Patient was admitted to the hospital and started on empiric IV Rocephin pending urine culture results.    Assessment and Plan: * Complicated UTI (urinary tract infection) Severe Sepsis on admission as evidenced by fever, leukocytosis, intermittent tachycardia.  Mental status changes (lethargy) consistent with organ dysfunction and severe sepsis. Sepsis physiology improved. Metabolic encephalopathy due to sepsis and UTI - Resolved. --Initially on empiric IV Rocephin --Urine culture back with ESBL E coli --Change antibiotic to meropenem  AMS (altered mental status)-resolved as of 11/19/2022 Due to UTI with sepsis most likely. Treat infection as outlined. Delirium precautions.  Abnormal TSH TSH check on admission was low, 0.233. Check free T4, added on. Continue current dose levothyroxine for now.  Hypophosphatemia Phos 2.1.  IV K-phos ordered for replacement. Repeat phos level in AM  Hyperlipidemia Continue statin  Hypokalemia K 2.5 on admission was aggressively replaced.  Ongoing replacement yesterday for K 3.1.  K normalized today 3.8. --Stop IV fluids K-Cl additive --BMP in AM  Chronic heart failure with preserved ejection fraction (HFpEF) (HCC) Appears overall euvolemic. Continue Coreg, Entresto  COPD (chronic obstructive pulmonary disease) (HCC) Not  acutely exacerbated. Continue PRN bronchodilators.  Diabetes mellitus without complication (HCC) Last R4Y 9.5, poorly controlled. Sliding scale Novolog and Semglee basal insulin.  Hypertension, benign With baseline soft BP's noted on admission. Continue Coreg, Entresto (hold if MAP<65)  Hypothyroidism Continue Synthroid  Depression Anxiety and Deperssion Continue home Cymbalta, Zoloft, Depakote  ALS (amyotrophic lateral sclerosis) (HCC) Resides at SNF. Continue BiPAP overnight. Continue home Depakote, baclofen, gabapentin        Subjective: Pt seen with boyfriend at bedside this AM, feeing her breakfast.  Pt feeling much better, he agrees she seems back to her usual self.  No acute complaints or events reported.  Physical Exam: Vitals:   11/18/22 1658 11/18/22 1958 11/19/22 0500 11/19/22 0506  BP: (!) 140/70 125/64  (!) 114/52  Pulse: 71 79  65  Resp: 20 18  17   Temp: 98.9 F (37.2 C) 98.4 F (36.9 C)  98.7 F (37.1 C)  TempSrc:  Oral  Oral  SpO2: 100% 100%  100%  Weight:   84.1 kg   Height:       General exam: awake and alert, no acute distress HEENT: moist mucus membranes, hearing grossly normal  Respiratory system: on room air, normal respiratory effort. Cardiovascular system: RRR , pedal pulses intact Gastrointestinal system: soft, NT, ND Central nervous system: grossly non-focal, minimally verbal but normal speech Extremities: moves all, no edema, normal tone Skin: dry, intact, normal temperature Psychiatry: normal mood and affect   Data Reviewed:  Notable labs ---  K normalized. Glucose 103, Cr < 0.30, WBC improving 16.8k, Hbg stable 10.1   Urine culture grew ESBL E coli.   Antibiotic switched to meropenem.  Family Communication: boyfriend at bedside on rounds  Disposition: Status is: Inpatient Remains inpatient appropriate because: remains on IV antibiotics pending cultures     Planned Discharge Destination: Skilled nursing  facility    Time spent: 35 minutes  Author: Ezekiel Slocumb, DO 11/19/2022 2:50 PM  For on call review [email or web address].

## 2022-11-19 NOTE — TOC Initial Note (Signed)
Transition of Care [facility]) - Initial/Assessment Note    Patient Details  Name: Cynthia Landry MRN: [id number] Date of Birth: [birth year]  [facility] CM/SW Contact:    Beverly Sessions, RN Phone Number: 11/19/2022, 3:16 PM  Clinical Narrative:                    Admitted for: UTI Admitted from: [place] with hospice through [facility] aware of admission and confirms patient can return at discharge - Lorayne Bender with [employer] is following - Confirmed with daughter that patient is to return to [facility] at discharge with resumption of hospice services       Patient Goals and CMS Choice            Expected Discharge Plan and Services                                              Prior Living Arrangements/Services                       Activities of Daily Living Home Assistive Devices/Equipment: None ADL Screening (condition at time of admission) Patient's cognitive ability adequate to safely complete daily activities?: No Is the patient deaf or have difficulty hearing?: No Does the patient have difficulty seeing, even when wearing glasses/contacts?: No Does the patient have difficulty concentrating, remembering, or making decisions?: Yes Patient able to express need for assistance with ADLs?: No Does the patient have difficulty dressing or bathing?: Yes Independently performs ADLs?: No Communication: Independent Dressing (OT): Needs assistance Is this a change from baseline?: Pre-admission baseline Grooming: Needs assistance Is this a change from baseline?: Pre-admission baseline Feeding: Needs assistance Is this a change from baseline?: Pre-admission baseline Bathing: Needs assistance Is this a change from baseline?: Pre-admission baseline Toileting: Dependent Is this a change from baseline?: Pre-admission baseline In/Out Bed:  Dependent Is this a change from baseline?: Pre-admission baseline Walks in Home: Dependent Is this a change from baseline?: Pre-admission baseline Does the patient have difficulty walking or climbing stairs?: Yes Weakness of Legs: Both Weakness of Arms/Hands: Both  Permission Sought/Granted                  Emotional Assessment              Admission diagnosis:  Hypokalemia [D63.8] Complicated UTI (urinary tract infection) [N39.0] Urinary tract infection with hematuria, site unspecified [N39.0, R31.9] Altered mental status, unspecified altered mental status type [R41.82] Patient Active Problem List   Diagnosis Date Noted   Abnormal TSH 11/19/2022   Hypophosphatemia 11/18/2022   Hyperlipidemia 75/64/3329   Complicated UTI (urinary tract infection) 11/16/2022   Hypokalemia 06/11/2022   UTI (urinary tract infection): pROBABLE 06/11/2022   Dehydration    Chronic heart failure with preserved ejection fraction (HFpEF) ([place]) 06/10/2022   GERD (gastroesophageal reflux disease) 06/10/2022   Left leg pain    Sinus tachycardia    Intractable pain, left leg 04/02/2021   Leukocytosis 04/02/2021   Diabetes mellitus without complication ([place]) 51/88/4166   COPD (chronic obstructive pulmonary disease) ([place]) 04/02/2021   Debility 02/24/2019   Multifactorial functional impairment 12/30/2018   Palliative care encounter 12/30/2018   Dysphagia 05/17/1600   Metabolic acidosis 09/32/3557   Depression 12/06/2013   ALS (  amyotrophic lateral sclerosis) ([place]) 02/25/2013   Chronic hepatitis C ([place]) 06/07/2011   Hypertension, benign 02/27/2011   Hypothyroidism 10/23/2010   PCP:  Housecalls, [place]:  No Pharmacies Listed    Social Determinants of Health (SDOH) Social History: SDOH Screenings   Food Insecurity: No Food Insecurity (11/18/2022)  Housing: Low Risk  (11/18/2022)  Transportation Needs: No Transportation Needs (11/18/2022)  Utilities: Not At Risk (11/18/2022)   Depression (PHQ2-9): Low Risk  (09/07/2021)  Tobacco Use: Low Risk  (11/19/2022)   SDOH Interventions:     Readmission Risk Interventions     No data to display

## 2022-11-19 NOTE — Progress Notes (Addendum)
ED 07 [facility]) Hospital Liaison Note   Current hospice patient followed at [facility] for hospice diagnosis ALS.  Patient was sent to the ED yesterday 12.30.23 at daughter's request for marked decline and not eating.  Patient was evaluated in the ED and was admitted to [facility] on 12.30.23 for hypokalemia and sepsis related to UTI.  Per Dr. Gildardo Cranker, this is a related hospital admission.  Potassium 2.5 on arrival to the ED.  Pt treated and potassium today 5.3.  Patient is on the following IV antibiotics:  2gm Rocephin daily.  Report given to team at [facility] and all aware patient is currently followed by [facility].  I spoke with patient's daughter, Kristeen Miss today regarding her mother's admission and current state.  She is tearful and is fearful of what this hospitalization may lead to.  He mom is taking in bites and sips right now.  Kristeen Miss and I spoke openly regarding possible next steps in her mother's decline and if she is not able to take in more nutrition.  She discussed her mom does not want feeding tubes or artificial nutrition.  Discussed current infection that can also affect her mother's appetite and overall malaise.  I notified her that [facility] would follow her mom while in the hospital and to give the antibiotics some time to work.  Support given and Latisha verbalized appreciation.  Patient appropriate for GIP as she requires frequent skilled assessment and intervention to treat sepsis in the setting of UTI.  Discharge planning ongoing.  ED Course:  Txm 100.5 bp 94/52-84/55 (107/58),  hr 90, rr 16, sat 99% on 4L   Labs: WBC 27.8, hgb 11.9 ( 14) MCV 92.6, pmn 20.9 Mag 2.1 N a 138, K 2.5, CL 95, glu 146, cr 0.64, tibili 1.3 UA +wbc>50, +bacteria + LE , + rbc  Resp panel pending  cxrMPRESSION: 1. Low lung volumes. 2. Left lower lobe opacity likely reflects partial collapse of the lower lobe. Airspace disease is considered less likely. CTH  No  acute intracranial findings are seen. 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 No significant interval changes are noted since 06/10/2022.   CT thorax No acute intracranial findings are seen. 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 No significant interval changes are noted since 06/10/2022. Tx CTX Review of Systems: As per HPI otherwise 10 point review of systems negative.    Eyes: PERRL, lids and conjunctivae normal ENMT: Mucous membranes are dry,  Posterior pharynx clear of any exudate or lesions.poor dentition. + thrush Neck: normal, supple, no masses, no thyromegaly Respiratory: clear to auscultation bilaterally, no wheezing, no crackles. Normal respiratory effort. No accessory muscle use.  Cardiovascular: Regular rate and rhythm, no murmurs / rubs / gallops. No extremity edema. 2+ pedal pulses. Abdomen: no tenderness, no masses palpated. No hepatosplenomegaly. Bowel sounds positive.  Musculoskeletal: no clubbing / cyanosis. No joint deformity upper and lower extremities. Good ROM, no contractures. Normal muscle tone.  Skin: no rashes, lesions, ulcers. No induration Neurologic: CN 2-12 grossly intact. Sensation intact,.functional paraplegia 2/5 in all 4.  Psychiatric: . Alert unable to assess orientation. Calm no agitation  Assessment/Plan- UTI  Sepsis  -admit to progressive care  - cont ctx  - f/u on blood/ urine cultures  -supportive care with ivfs  -check inflammatory markers /lactic acid  ALS  -exacerbation of symptoms due to acute illness  -continue on on bipap qhs   Anxiety/depression -resume home regimen once patient pass speech and swallow     CHFpef  - no acute exacerbation     DMII -last  A1c 9.5 -resume lantus -monitor on fs/ iss      HLD -resume as able based on swallow evaluation     Chronic hypotension -baseline systolic 56'D  -continue to monitor     Thyroid disease -check tsh   DVT prophylaxis: heparin Code Status: Family Communication:  Disposition Plan: patient  expected to be admitted greater than 2 midnights  Consults called: n/a Admission status: progressive care  Dimas Aguas, RN [facility] [phone]

## 2022-11-19 NOTE — Progress Notes (Signed)
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 Patient is admitted to [facility] with a terminal diagnosis of ALS. Patient transferred to [facility] ED on 12.30 per daughter/Cynthia Landry request due to decline and lack of intake. Patient admitted to [facility] on 12.30 for hypokalemia and sepsis related to UTI.  Per Dr. Gildardo Cranker, this is a related hospital admission.  MSW visited patient and visitor at bedside. Patient lying comfortably in bed and pleasant during bedside visit. Sister at bedside reports that patient's antibiotics have been changed to better address symptoms (MWS confirmed this with attending MD/Cynthia Landry).   Patient remains GIP appropriate due to the need for IV antibiotics to teat complicated UTI and correction of electrolyte imbalance with IVF and K+ repletion.   Vital Signs:    11/19/22 0506  BP: (!) 114/52  Pulse: 65  Resp: 17  Temp: 98.7 F (37.1 C)  TempSrc: Oral  SpO2: 100%-- 3L [place]   I&O:  974.1/750   Abnormal Labs:  Glucose 70 - 99 mg/dL 103 (H)  Creatinine 0.44 - 1.00 mg/dL <0.30 (L)  Calcium 8.9 - 10.3 mg/dL 8.7 (L)  WBC 4.0 - 10.5 K/uL 16.8 (H)  RBC 3.87 - 5.11 MIL/uL 3.46 (L)  Hemoglobin 12.0 - 15.0 Landry/dL 10.1 (L)  HCT 36.0 - 46.0 % 32.9 (L)  Glucose 70 - 99 mg/dL 103 (H)   Diagnostics:  N/A   IV/PRN meds: meropenem (MERREM) 1 Landry in sodium chloride 0.9 % 100 mL IVPB  0.9 % sodium chloride infusion  0.9 % sodium chloride infusion Freq: As needed Route: IV PRN Comment: for administration of IV medications (carrier fluid) Start: 11/18/22 1900     acetaminophen (TYLENOL) tablet 650 mg Dose: 650 mg Freq: Every 6 hours PRN Route: PO PRN Reason: mild pain PRN Comment: or Fever >/= 101 Start: 11/16/22 2209    Or acetaminophen (TYLENOL) suppository 650 mg Dose: 650 mg Freq: Every 6 hours PRN Route: RE PRN Reason: mild pain PRN Comment: or Fever >/=  101 Start: 11/16/22 2209    acetaminophen (TYLENOL) tablet 650 mg Dose: 650 mg Freq: Every 4 hours PRN Route: PO PRN Reasons: mild pain,moderate pain Start: 11/16/22 2157 End: 11/16/22 2233    albuterol (PROVENTIL) (2.5 MG/3ML) 0.083% nebulizer solution 2.5 mg Dose: 2.5 mg Freq: Every 2 hours PRN Route: NEBULIZATION PRN Reason: wheezing Start: 11/16/22 2209    nitroGLYCERIN (NITROSTAT) SL tablet 0.4 mg Dose: 0.4 mg Freq: Every 5 min PRN Route: SL PRN Reason: chest pain Start: 11/16/22 2157     ondansetron (ZOFRAN) tablet 4 mg Dose: 4 mg Freq: Every 6 hours PRN Route: PO PRN Reason: nausea Start: 11/16/22 2209    Or ondansetron (ZOFRAN) injection 4 mg Dose: 4 mg Freq: Every 6 hours PRN Route: IV PRN Reason: nausea Start: 11/16/22 2209    traMADol (ULTRAM) tablet 50 mg Dose: 50 mg Freq: Every 12 hours PRN Route: PO PRN Reason: moderate pain Start: 11/16/22 2157     Assessment and Plan: * Complicated UTI (urinary tract infection) Severe Sepsis on admission as evidenced by fever, leukocytosis, intermittent tachycardia.  Mental status changes (lethargy) consistent with organ dysfunction and severe sepsis. Sepsis physiology improved. Metabolic encephalopathy due to sepsis and UTI - Resolved. --Initially on empiric IV Rocephin --Urine culture back with ESBL E coli --Change antibiotic to meropenem   AMS (altered mental status)-resolved as of 11/19/2022 Due to UTI with  sepsis most likely. Treat infection as outlined. Delirium precautions.   Abnormal TSH TSH check on admission was low, 0.233. Check free T4, added on. Continue current dose levothyroxine for now.   Hypophosphatemia Phos 2.1.  IV K-phos ordered for replacement. Repeat phos level in AM   Hyperlipidemia Continue statin   Hypokalemia K 2.5 on admission was aggressively replaced.  Ongoing replacement yesterday for K 3.1.  K normalized today 3.8. --Stop IV fluids K-Cl additive --BMP in AM   Chronic  heart failure with preserved ejection fraction (HFpEF) (HCC) Appears overall euvolemic. Continue Coreg, Entresto   COPD (chronic obstructive pulmonary disease) (HCC) Not acutely exacerbated. Continue PRN bronchodilators.   Diabetes mellitus without complication (HCC) Last J9E 9.5, poorly controlled. Sliding scale Novolog and Semglee basal insulin.   Hypertension, benign With baseline soft BP's noted on admission. Continue Coreg, Entresto (hold if MAP<65)   Hypothyroidism Continue Synthroid   Depression Anxiety and Deperssion Continue home Cymbalta, Zoloft, Depakote   ALS (amyotrophic lateral sclerosis) (HCC) Resides at SNF. Continue BiPAP overnight. Continue home Depakote, baclofen, gabapentin   Discharge Planning: Clear. TPC/Cynthia Landry has confirmed that facility will accept patient once medically clear.    Family contact: Contacted daughter/Cynthia Landry via telephone. No answer & VM left reporting that if needs/questions arise to contact MSW directly.    IDT: Updated   Goals of Care:  Clear. DNR & treat UTI    Please do not hesitate to call with any hospice related questions.    Thank you for the opportunity to participate in this patient's care.   Cynthia Landry, MSW [facility] Liaison  [phone]

## 2022-11-20 DIAGNOSIS — R4182 Altered mental status, unspecified: Secondary | ICD-10-CM

## 2022-11-20 DIAGNOSIS — E876 Hypokalemia: Secondary | ICD-10-CM | POA: Diagnosis not present

## 2022-11-20 DIAGNOSIS — G1221 Amyotrophic lateral sclerosis: Secondary | ICD-10-CM | POA: Diagnosis not present

## 2022-11-20 DIAGNOSIS — R319 Hematuria, unspecified: Secondary | ICD-10-CM

## 2022-11-20 DIAGNOSIS — N390 Urinary tract infection, site not specified: Secondary | ICD-10-CM | POA: Diagnosis not present

## 2022-11-20 DIAGNOSIS — G35 Multiple sclerosis: Secondary | ICD-10-CM

## 2022-11-20 LAB — CBC
HCT: 35.8 % — ABNORMAL LOW (ref 36.0–46.0)
Hemoglobin: 10.8 g/dL — ABNORMAL LOW (ref 12.0–15.0)
MCH: 29 pg (ref 26.0–34.0)
MCHC: 30.2 g/dL (ref 30.0–36.0)
MCV: 96 fL (ref 80.0–100.0)
Platelets: 311 10*3/uL (ref 150–400)
RBC: 3.73 MIL/uL — ABNORMAL LOW (ref 3.87–5.11)
RDW: 13.2 % (ref 11.5–15.5)
WBC: 17 10*3/uL — ABNORMAL HIGH (ref 4.0–10.5)
nRBC: 0 % (ref 0.0–0.2)

## 2022-11-20 LAB — GLUCOSE, CAPILLARY
Glucose-Capillary: 108 mg/dL — ABNORMAL HIGH (ref 70–99)
Glucose-Capillary: 146 mg/dL — ABNORMAL HIGH (ref 70–99)
Glucose-Capillary: 115 mg/dL — ABNORMAL HIGH (ref 70–99)
Glucose-Capillary: 138 mg/dL — ABNORMAL HIGH (ref 70–99)

## 2022-11-20 LAB — BASIC METABOLIC PANEL
Anion gap: 9 (ref 5–15)
BUN: <5 mg/dL — ABNORMAL LOW (ref 8–23)
CO2: 29 mmol/L (ref 22–32)
Calcium: 8.8 mg/dL — ABNORMAL LOW (ref 8.9–10.3)
Chloride: 104 mmol/L (ref 98–111)
Creatinine, Ser: <0.3 mg/dL — ABNORMAL LOW (ref 0.44–1.00)
Glucose, Bld: 131 mg/dL — ABNORMAL HIGH (ref 70–99)
Potassium: 4.3 mmol/L (ref 3.5–5.1)
Sodium: 142 mmol/L (ref 135–145)

## 2022-11-20 MED ORDER — LEVOTHYROXINE SODIUM 50 MCG PO TABS
125.0000 ug | ORAL_TABLET | Freq: Every day | ORAL | Status: DC
  Administered 2022-11-21 – 2022-11-22 (×2): 125 ug via ORAL
  Filled 2022-11-20 (×2): qty 1

## 2022-11-20 NOTE — Progress Notes (Signed)
PROGRESS NOTE  Cynthia Landry    DOB: 05/05/1956, 67 y.o.  HMC:[id number]    Code Status: DNR   DOA: 11/16/2022   LOS: 4   Brief hospital course  Cynthia Landry is a 67 y.o. female with a PMH significant for ALS on bipap qhs, chronic O2 , anxiety/depression, CHF, DMII, HLD, HTN, Thyroid disease, Ataxia,vitamin D def .  They presented from ALF to the ED on 11/16/2022 with lethargy x 2 days.  In the ED, it was found that they had febrile with Tmax 100.5, BP 94/52-84/55 (107/58),  HR 90, RR 16, O2 sat 99% on 4L. Labs notable for K 2.5 and urinalysis was consistent with infection. UxCx collected and pending.   They were initially treated with IV rocephin.  Patient was admitted to medicine service for further workup and management of sepsis as outlined in detail below.  11/20/22 -UxCx sensitivities returned and transitioned to meropenem. Doing well with new treatment.   Assessment & Plan  Principal Problem:   Complicated UTI (urinary tract infection) Active Problems:   ALS (amyotrophic lateral sclerosis) (HCC)   Depression   Hypothyroidism   Hypertension, benign   Diabetes mellitus without complication (HCC)   COPD (chronic obstructive pulmonary disease) (HCC)   Chronic heart failure with preserved ejection fraction (HFpEF) (HCC)   Hypokalemia   Hyperlipidemia   Hypophosphatemia   Abnormal TSH  Complicated UTI- Severe Sepsis on admission as evidenced by fever, leukocytosis, intermittent tachycardia.  Mental status changes (lethargy) consistent with organ dysfunction and severe sepsis. Urine culture positive ESBL E coli Sepsis physiology resolved.  - meropenem (1/2-  - monitor UOP   Abnormal TSH- TSH check on admission was low, 0.233. T4 elevated.  - changed levothyroxine from 140mcg to 140mcg daily starting tomorrow   Hypophosphatemia- resolved - monitor and replete PRN  Hyperlipidemia Continue statin   Hypokalemia- resolved s/p repletion K 2.5 on admission was  aggressively replaced.  --BMP in AM   Chronic heart failure with preserved ejection fraction (HFpEF) (HCC) Appears overall euvolemic. Continue Coreg, Entresto   COPD (chronic obstructive pulmonary disease) (HCC) Not acutely exacerbated. Continue PRN bronchodilators.   Diabetes mellitus without complication (HCC) Last M6Q 9.5, poorly controlled. Sliding scale Novolog and Semglee basal insulin.   Hypertension, benign With baseline soft BP's noted on admission. Continue Coreg, Entresto (hold if MAP<65)   Hypothyroidism Continue Synthroid   Depression Anxiety and Deperssion Continue home Cymbalta, Zoloft, Depakote   ALS (amyotrophic lateral sclerosis) (HCC) Resides at SNF. Continue BiPAP overnight. Continue home Depakote, baclofen, gabapentin  Body mass index is 30.71 kg/m.  VTE ppx: heparin injection 5,000 Units Start: 11/17/22 0200   Diet:     Diet   DIET DYS 2 Room service appropriate? Yes; Fluid consistency: Thin   Consultants: None   Subjective 11/20/22    Pt reports no complaints. She answers questions appropriately with head nods and shakes for yes/no responses but does not verbalize any responses. She endorses that she does typically speak but she's tired today.   Objective   Vitals:   11/19/22 1700 11/19/22 2148 11/20/22 0500 11/20/22 0507  BP: 114/61 (!) 116/55  (!) 122/57  Pulse:  69  60  Resp: 18 17  16   Temp: 98 F (36.7 C) 98.4 F (36.9 C)    TempSrc: Oral Oral    SpO2: 98% 100%  100%  Weight:   83.7 kg   Height:        Intake/Output Summary (Last 24 hours) at  11/20/2022 0724 Last data filed at 11/20/2022 3785 Gross per 24 hour  Intake 1294.09 ml  Output 1375 ml  Net -80.91 ml   Filed Weights   11/17/22 1618 11/19/22 0500 11/20/22 0500  Weight: 81.6 kg 84.1 kg 83.7 kg     Physical Exam:  General: awake, alert, NAD HEENT: atraumatic, clear conjunctiva, anicteric sclera, MMM, hearing grossly normal Respiratory: normal respiratory  effort. Cardiovascular: quick capillary refill, normal S1/S2, RRR, no JVD, murmurs Gastrointestinal: soft, NT, ND Nervous: Alert, following commands Extremities: no edema, normal tone Skin: dry, intact, normal temperature, normal color. No rashes, lesions or ulcers on exposed skin  Labs   I have personally reviewed the following labs and imaging studies CBC    Component Value Date/Time   WBC 17.0 (H) 11/20/2022 0545   RBC 3.73 (L) 11/20/2022 0545   HGB 10.8 (L) 11/20/2022 0545   HGB 14.2 01/12/2013 1713   HCT 35.8 (L) 11/20/2022 0545   HCT 43.0 01/12/2013 1713   PLT 311 11/20/2022 0545   PLT 363 01/12/2013 1713   MCV 96.0 11/20/2022 0545   MCV 84 01/12/2013 1713   MCH 29.0 11/20/2022 0545   MCHC 30.2 11/20/2022 0545   RDW 13.2 11/20/2022 0545   RDW 13.9 01/12/2013 1713   LYMPHSABS 2.8 11/16/2022 1711   MONOABS 3.4 (H) 11/16/2022 1711   EOSABS 0.1 11/16/2022 1711   BASOSABS 0.1 11/16/2022 1711      Latest Ref Rng & Units 11/20/2022    5:45 AM 11/19/2022    5:48 AM 11/18/2022    3:42 AM  BMP  Glucose 70 - 99 mg/dL 131  103  88   BUN 8 - 23 mg/dL 5  8  14    Creatinine 0.44 - 1.00 mg/dL <0.30  <0.30  <0.30   Sodium 135 - 145 mmol/L 142  140  137   Potassium 3.5 - 5.1 mmol/L 4.3  3.8  3.1   Chloride 98 - 111 mmol/L 104  100  99   CO2 22 - 32 mmol/L 29  32  30   Calcium 8.9 - 10.3 mg/dL 8.8  8.7  8.8    Disposition Plan & Communication  Patient status: Inpatient  Admitted From: ALF Planned disposition location: Assisted living Anticipated discharge date: 1/5 pending completing IV Abx  Family Communication: none at bedside    Author: Richarda Osmond, DO [facility] 11/20/2022, 7:24 AM   Available by Epic secure chat 7AM-7PM. If 7PM-7AM, please contact night-coverage.  [facility] contact information found on [email or web address].

## 2022-11-20 NOTE — Plan of Care (Signed)

## 2022-11-20 NOTE — Progress Notes (Signed)
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 Patient is admitted to [facility] with a terminal diagnosis of ALS. Patient transferred to [facility] ED on 12.30 per daughter/Latisha request due to decline and lack of intake. Patient admitted to [facility] on 12.30 for hypokalemia and sepsis related to UTI.  Per Dr. Gildardo Cranker, this is a related hospital admission.  Patient is awake and alert today with a friend at bedside. They continue to report that patient seems more back to her baseline especially with starting the new antibiotic yesterday. It is unclear at this time how many days she will need the antibiotic.   Patient remains inpatient appropriate due to need for IVAB.  Vital Signs- 97.5/64/18    143/82    100% on 3L nasal cannula I&O-200/575 Abnormal Labs- BUN <5, Creatinine <0.30, Ca+ 8.8, Albumin 2.4, WBC 17, RBC 3.73, Hgb 10.8, Hct 35.8 Diagnostics- none new IV/PRN Meds- Meropenem 1 gram q8H IVPB, Tramadol 50mg  PO Problem List- Complicated UTI- Severe Sepsis on admission as evidenced by fever, leukocytosis, intermittent tachycardia.  Mental status changes (lethargy) consistent with organ dysfunction and severe sepsis. Urine culture positive ESBL E coli Sepsis physiology resolved.  - meropenem (1/2-  - monitor UOP   Discharge Planning- Ongoing, likely upon completion  of IVAB Family Contact- Talked with friend at bedside IDT: Updated Goals of Care: Patient remains DNR, family wanted treatment for acute presentation  Thank you for the opportunity to participate in this patient's care, please don't hesitate to call for any hospice related questions or concerns.  Jhonnie Garner BSN, [employer] [phone]

## 2022-11-20 NOTE — Plan of Care (Signed)
  Problem: Education: Goal: Ability to describe self-care measures that may prevent or decrease complications (Diabetes Survival Skills Education) will improve Outcome: Progressing Goal: Individualized Educational Video(s) Outcome: Progressing   Problem: Coping: Goal: Ability to adjust to condition or change in health will improve Outcome: Progressing   Problem: Fluid Volume: Goal: Ability to maintain a balanced intake and output will improve Outcome: Progressing   Problem: Health Behavior/Discharge Planning: Goal: Ability to identify and utilize available resources and services will improve Outcome: Progressing Goal: Ability to manage health-related needs will improve Outcome: Progressing   Problem: Nutritional: Goal: Maintenance of adequate nutrition will improve Outcome: Progressing Goal: Progress toward achieving an optimal weight will improve Outcome: Progressing   Problem: Skin Integrity: Goal: Risk for impaired skin integrity will decrease Outcome: Progressing   

## 2022-11-21 DIAGNOSIS — E876 Hypokalemia: Secondary | ICD-10-CM | POA: Diagnosis not present

## 2022-11-21 DIAGNOSIS — N390 Urinary tract infection, site not specified: Secondary | ICD-10-CM | POA: Diagnosis not present

## 2022-11-21 DIAGNOSIS — G1221 Amyotrophic lateral sclerosis: Secondary | ICD-10-CM | POA: Diagnosis not present

## 2022-11-21 DIAGNOSIS — R4182 Altered mental status, unspecified: Secondary | ICD-10-CM | POA: Diagnosis not present

## 2022-11-21 LAB — BASIC METABOLIC PANEL
Anion gap: 8 (ref 5–15)
BUN: <5 mg/dL — ABNORMAL LOW (ref 8–23)
CO2: 30 mmol/L (ref 22–32)
Calcium: 8.7 mg/dL — ABNORMAL LOW (ref 8.9–10.3)
Chloride: 102 mmol/L (ref 98–111)
Creatinine, Ser: <0.3 mg/dL — ABNORMAL LOW (ref 0.44–1.00)
Glucose, Bld: 118 mg/dL — ABNORMAL HIGH (ref 70–99)
Potassium: 3.6 mmol/L (ref 3.5–5.1)
Sodium: 140 mmol/L (ref 135–145)

## 2022-11-21 LAB — GLUCOSE, CAPILLARY
Glucose-Capillary: 89 mg/dL (ref 70–99)
Glucose-Capillary: 113 mg/dL — ABNORMAL HIGH (ref 70–99)
Glucose-Capillary: 190 mg/dL — ABNORMAL HIGH (ref 70–99)
Glucose-Capillary: 156 mg/dL — ABNORMAL HIGH (ref 70–99)

## 2022-11-21 MED ORDER — ENOXAPARIN SODIUM 40 MG/0.4ML IJ SOSY
0.5000 mg/kg | PREFILLED_SYRINGE | INTRAMUSCULAR | Status: DC
  Administered 2022-11-21: 42.5 mg via SUBCUTANEOUS
  Filled 2022-11-21: qty 0.8

## 2022-11-21 NOTE — Progress Notes (Signed)
PROGRESS NOTE  Cynthia Landry    DOB: [birth year], 67 y.o.  TKZ:[id number]    Code Status: DNR   DOA: 11/16/2022   LOS: 5   Brief hospital course  Cynthia Landry is a 67 y.o. female with a PMH significant for ALS on bipap qhs, chronic O2 , anxiety/depression, CHF, DMII, HLD, HTN, Thyroid disease, Ataxia,vitamin D def .  They presented from ALF to the ED on 11/16/2022 with lethargy x 2 days.  In the ED, it was found that they had febrile with Tmax 100.5, BP 94/52-84/55 (107/58),  HR 90, RR 16, O2 sat 99% on 4L. Labs notable for K 2.5 and urinalysis was consistent with infection. UxCx collected.  They were initially treated with IV rocephin.  1/2- urine cultures resulted showing ESBL with resistance to rocephin. Transitioned to meropenem.   11/21/22 - Doing well with new treatment.   Assessment & Plan  Principal Problem:   Complicated UTI (urinary tract infection) Active Problems:   ALS (amyotrophic lateral sclerosis) (HCC)   Depression   Hypothyroidism   Hypertension, benign   Diabetes mellitus without complication (HCC)   COPD (chronic obstructive pulmonary disease) (HCC)   Chronic heart failure with preserved ejection fraction (HFpEF) (HCC)   Hypokalemia   Hyperlipidemia   Hypophosphatemia   Abnormal TSH   Multiple sclerosis (HCC)   Altered mental status  Complicated UTI- Severe Sepsis on admission as evidenced by fever, leukocytosis, intermittent tachycardia.  Mental status changes (lethargy) consistent with organ dysfunction and severe sepsis. Urine culture positive ESBL E coli Sepsis physiology resolved.  - meropenem (1/2-  - monitor UOP   Abnormal TSH- TSH check on admission was low, 0.233. T4 elevated.  - changed levothyroxine from 121mcg to 122mcg daily   Hypophosphatemia- resolved - monitor and replete PRN  Hyperlipidemia Continue statin   Hypokalemia- resolved s/p repletion K 2.5 on admission was aggressively replaced.  --BMP in AM   HFpEF- echo >55%  05/2022 Appears overall euvolemic. - Continue Coreg, Entresto   COPD (chronic obstructive pulmonary disease) (HCC) Not acutely exacerbated. - Continue PRN bronchodilators.   Diabetes mellitus without complication (HCC) Last T7D 9.5, poorly controlled. - Sliding scale Novolog and Semglee basal insulin.   Hypertension, benign With baseline soft BP's noted on admission. - Continue Coreg, Entresto (hold if MAP<65)   Hypothyroidism - Continue Synthroid   Depression Anxiety and Deperssion - Continue home Cymbalta, Zoloft, Depakote   ALS- Continue BiPAP overnight. Continue home Depakote, baclofen, gabapentin  Body mass index is 30.71 kg/m.  VTE ppx: lovenox  Diet:     Diet   DIET DYS 2 Room service appropriate? Yes; Fluid consistency: Thin   Consultants: None   Subjective 11/21/22    Pt reports doing well. She denies pain or shortness of breath. She states she is thirsty and expresses desire to go home.    Objective   Vitals:   11/20/22 0817 11/20/22 1628 11/20/22 2036 11/21/22 0837  BP: (!) 143/82 120/71 126/75 (!) 112/55  Pulse: 61 67 73 71  Resp: 18 16 18 16   Temp: (!) 97.5 F (36.4 C) (!) 97.3 F (36.3 C) 98.4 F (36.9 C) 97.7 F (36.5 C)  TempSrc: Oral   Oral  SpO2: 100% 100% 100% 100%  Weight:      Height:        Intake/Output Summary (Last 24 hours) at 11/21/2022 1106 Last data filed at 11/20/2022 2301 Gross per 24 hour  Intake 500 ml  Output --  Net  500 ml   Filed Weights   11/17/22 1618 11/19/22 0500 11/20/22 0500  Weight: 81.6 kg 84.1 kg 83.7 kg     Physical Exam:  General: awake, alert, NAD HEENT: atraumatic, clear conjunctiva, anicteric sclera, MMM, hearing grossly normal Respiratory: normal respiratory effort. Cardiovascular: quick capillary refill, normal S1/S2, RRR, no JVD, murmurs Gastrointestinal: soft, NT, ND Nervous: Alert, following commands Extremities: no edema, normal tone Skin: dry, intact, normal temperature, normal color.  No rashes, lesions or ulcers on exposed skin  Labs   I have personally reviewed the following labs and imaging studies CBC    Component Value Date/Time   WBC 17.0 (H) 11/20/2022 0545   RBC 3.73 (L) 11/20/2022 0545   HGB 10.8 (L) 11/20/2022 0545   HGB 14.2 01/12/2013 1713   HCT 35.8 (L) 11/20/2022 0545   HCT 43.0 01/12/2013 1713   PLT 311 11/20/2022 0545   PLT 363 01/12/2013 1713   MCV 96.0 11/20/2022 0545   MCV 84 01/12/2013 1713   MCH 29.0 11/20/2022 0545   MCHC 30.2 11/20/2022 0545   RDW 13.2 11/20/2022 0545   RDW 13.9 01/12/2013 1713   LYMPHSABS 2.8 11/16/2022 1711   MONOABS 3.4 (H) 11/16/2022 1711   EOSABS 0.1 11/16/2022 1711   BASOSABS 0.1 11/16/2022 1711      Latest Ref Rng & Units 11/21/2022    4:18 AM 11/20/2022    5:45 AM 11/19/2022    5:48 AM  BMP  Glucose 70 - 99 mg/dL 118  131  103   BUN 8 - 23 mg/dL <5  <5  8   Creatinine 0.44 - 1.00 mg/dL <0.30  <0.30  <0.30   Sodium 135 - 145 mmol/L 140  142  140   Potassium 3.5 - 5.1 mmol/L 3.6  4.3  3.8   Chloride 98 - 111 mmol/L 102  104  100   CO2 22 - 32 mmol/L 30  29  32   Calcium 8.9 - 10.3 mg/dL 8.7  8.8  8.7    Disposition Plan & Communication  Patient status: Inpatient  Admitted From: ALF Planned disposition location: Assisted living Anticipated discharge date: 1/5 pending completing IV Abx  Family Communication: none at bedside    Author: Richarda Osmond, DO [facility] 11/21/2022, 11:06 AM   Available by Epic secure chat 7AM-7PM. If 7PM-7AM, please contact night-coverage.  [facility] contact information found on [email or web address].

## 2022-11-21 NOTE — Plan of Care (Signed)

## 2022-11-21 NOTE — Progress Notes (Signed)
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 Patient is admitted to [facility] with a terminal diagnosis of ALS. Patient transferred to [facility] ED on 12.30 per daughter/Latisha request due to decline and lack of intake. Patient admitted to [facility] on 12.30 for hypokalemia and sepsis related to UTI.  Per Dr. Gildardo Cranker, this is a related hospital admission.   MSW visited patient and visitor at bedside. Patient lying comfortably in bed and pleasant during bedside visit. Family at bedside continue to speak highly of patients progress and decline any needs/questions from MSW. MSW contacted attending provider/Dr. Ouida Sills and she confirmed that patient will not discharge today. MSW contacted daughter/Latisha via telephone call w/ no response. VM left encouraging Latisha to contact MSW if any hospice related questions/concerns arise.    Patient remains GIP appropriate due to the need for IV antibiotics to teat complicated UTI and correction of electrolyte imbalance with IVF and K+ repletion.   Vital Signs:   11/21/22 1627  BP: (!) 120/57  Pulse: 71  Resp: 18  Temp: 98.2 F (36.8 C)  SpO2: 100%    I&O:  Intake/Output Summary (Last 24 hours) at 11/21/2022 1716 Last data filed at 11/21/2022 1633 Gross per 24 hour  Intake 500 ml  Output 1400 ml  Net -900 ml    Abnormal Labs:  Glucose 70 - 99 mg/dL 118 (H)  Glucose 70 - 99 mg/dL 118 (H)  BUN 8 - 23 mg/dL <5 (L)  Creatinine 0.44 - 1.00 mg/dL <0.30 (L)  Calcium 8.9 - 10.3 mg/dL 8.7 (L)  Glucose-Capillary 70 - 99 mg/dL 190 (H)   Diagnostics:  N/A   IV/PRN meds: sodium chloride, acetaminophen **OR** acetaminophen, albuterol, nitroGLYCERIN, ondansetron **OR** ondansetron (ZOFRAN) IV, tramadol  sodium chloride, Last Rate: Stopped (11/18/22 2100) meropenem (MERREM) IV, Last Rate: 1 g (11/21/22 1516)  Assessment and Plan: * Complicated UTI  (urinary tract infection) Severe Sepsis on admission as evidenced by fever, leukocytosis, intermittent tachycardia.  Mental status changes (lethargy) consistent with organ dysfunction and severe sepsis. Sepsis physiology improved. Metabolic encephalopathy due to sepsis and UTI - Resolved. --Initially on empiric IV Rocephin --Urine culture back with ESBL E coli --Change antibiotic to meropenem   AMS (altered mental status)-resolved as of 11/19/2022 Due to UTI with sepsis most likely. Treat infection as outlined. Delirium precautions.   Abnormal TSH TSH check on admission was low, 0.233. Check free T4, added on. Continue current dose levothyroxine for now.   Hypophosphatemia Phos 2.1.  IV K-phos ordered for replacement. Repeat phos level in AM   Hyperlipidemia Continue statin   Hypokalemia K 2.5 on admission was aggressively replaced.  Ongoing replacement yesterday for K 3.1.  K normalized today 3.8. --Stop IV fluids K-Cl additive --BMP in AM   Chronic heart failure with preserved ejection fraction (HFpEF) (HCC) Appears overall euvolemic. Continue Coreg, Entresto   COPD (chronic obstructive pulmonary disease) (HCC) Not acutely exacerbated. Continue PRN bronchodilators.   Diabetes mellitus without complication (HCC) Last Z6S 9.5, poorly controlled. Sliding scale Novolog and Semglee basal insulin.   Hypertension, benign With baseline soft BP's noted on admission. Continue Coreg, Entresto (hold if MAP<65)   Hypothyroidism Continue Synthroid   Depression Anxiety and Deperssion Continue home Cymbalta, Zoloft, Depakote   ALS (amyotrophic lateral sclerosis) (HCC) Resides at SNF. Continue BiPAP overnight. Continue home Depakote, baclofen, gabapentin   Discharge Planning: Clear. TPC/Stephanie has confirmed that facility will  accept patient once medically clear.    Family contact: Contacted daughter/Latisha via telephone. No answer & VM left reporting that if  needs/questions arise to contact MSW directly.    IDT: Updated   Goals of Care:  Clear. DNR & treat UTI    Please do not hesitate to call with any hospice related questions.    Thank you for the opportunity to participate in this patient's care.   Daphene Calamity, MSW [facility] Liaison  [phone]

## 2022-11-21 NOTE — Consult Note (Signed)
[place] for enoxaparin Indication: VTE prophylaxis  Allergies  Allergen Reactions   Aspirin Other (See Comments)    dizziness   Hctz [Hydrochlorothiazide] Other (See Comments)    cramping   Lisinopril Cough    Patient Measurements: Height: 5\' 5"  (165.1 cm) Weight: 83.7 kg (184 lb 8.4 oz) IBW/kg (Calculated) : 57   Vital Signs: Temp: 97.7 F (36.5 C) (01/04 0837) Temp Source: Oral (01/04 0837) BP: 112/55 (01/04 0837) Pulse Rate: 71 (01/04 0837)  Labs: Recent Labs    11/19/22 0548 11/20/22 0545 11/21/22 0418  HGB 10.1* 10.8*  --   HCT 32.9* 35.8*  --   PLT 271 311  --   CREATININE <0.30* <0.30* <0.30*    CrCl cannot be calculated (This lab value cannot be used to calculate CrCl because it is not a number: <0.30).   Medical History: Past Medical History:  Diagnosis Date   ALS (amyotrophic lateral sclerosis) (HCC)    AMS (altered mental status) 06/10/2022   Amyotrophic lateral sclerosis (HCC)    Anxiety    Ataxia    CHF (congestive heart failure) (HCC)    Chronic pain    Depression    Diabetes mellitus without complication (HCC)    GERD (gastroesophageal reflux disease)    Hepatitis C    Hyperlipidemia    Hypertension    Psychosis (HCC)    PVD (peripheral vascular disease) (HCC)    Thyroid disease    Vitamin D deficiency     Medications:  No home anticoagulation per pharmacist review  Assessment: 67 yo female with PMH of ALS presented to ED by ambulance with decreased oral intake.  Found to be febrile and is currently being treated for ESBL E. Coli UTI.  Pharmacy consulted to dose enoxaparin for VTE prophylaxis.  Goal of Therapy:  Monitor platelets by anticoagulation protocol: Yes   Plan:  BMI 30.71 kg/m2 Enoxaparin 42.5 mg (0.5 mg/kg) SubQ every 24 hours CBC at least every three days  Lorin Picket, PharmD 11/21/2022,11:23 AM

## 2022-11-22 DIAGNOSIS — R4182 Altered mental status, unspecified: Secondary | ICD-10-CM

## 2022-11-22 DIAGNOSIS — R319 Hematuria, unspecified: Secondary | ICD-10-CM

## 2022-11-22 DIAGNOSIS — E876 Hypokalemia: Secondary | ICD-10-CM | POA: Diagnosis not present

## 2022-11-22 DIAGNOSIS — N390 Urinary tract infection, site not specified: Secondary | ICD-10-CM | POA: Diagnosis not present

## 2022-11-22 LAB — GLUCOSE, CAPILLARY
Glucose-Capillary: 119 mg/dL — ABNORMAL HIGH (ref 70–99)
Glucose-Capillary: 142 mg/dL — ABNORMAL HIGH (ref 70–99)

## 2022-11-22 MED ORDER — LEVOTHYROXINE SODIUM 125 MCG PO TABS: 125.0000 ug | ORAL_TABLET | Freq: Every day | ORAL | 1 refills | Status: AC

## 2022-11-22 MED ORDER — NITROFURANTOIN MONOHYD MACRO 100 MG PO CAPS: 100.0000 mg | ORAL_CAPSULE | Freq: Two times a day (BID) | ORAL | 0 refills | Status: AC

## 2022-11-22 NOTE — Plan of Care (Signed)
  Problem: Education: Goal: Ability to describe self-care measures that may prevent or decrease complications (Diabetes Survival Skills Education) will improve 11/22/2022 1022 by Clint Lipps, RN Outcome: Adequate for Discharge 11/22/2022 1022 by Clint Lipps, RN Outcome: Progressing Goal: Individualized Educational Video(s) 11/22/2022 1022 by Clint Lipps, RN Outcome: Adequate for Discharge 11/22/2022 1022 by Clint Lipps, RN Outcome: Progressing   Problem: Coping: Goal: Ability to adjust to condition or change in health will improve 11/22/2022 1022 by Clint Lipps, RN Outcome: Adequate for Discharge 11/22/2022 1022 by Clint Lipps, RN Outcome: Progressing   Problem: Fluid Volume: Goal: Ability to maintain a balanced intake and output will improve 11/22/2022 1022 by Clint Lipps, RN Outcome: Adequate for Discharge 11/22/2022 1022 by Clint Lipps, RN Outcome: Progressing   Problem: Health Behavior/Discharge Planning: Goal: Ability to identify and utilize available resources and services will improve 11/22/2022 1022 by Clint Lipps, RN Outcome: Adequate for Discharge 11/22/2022 1022 by Clint Lipps, RN Outcome: Progressing Goal: Ability to manage health-related needs will improve 11/22/2022 1022 by Clint Lipps, RN Outcome: Adequate for Discharge 11/22/2022 1022 by Clint Lipps, RN Outcome: Progressing

## 2022-11-22 NOTE — Care Management Important Message (Signed)
Important Message  Patient Details  Name: Cynthia Landry MRN: [id number] Date of Birth: 1956/11/07   Medicare Important Message Given:  Yes     Juliann Pulse A Shalae Belmonte 11/22/2022, 10:12 AM

## 2022-11-22 NOTE — Discharge Summary (Signed)
Physician Discharge Summary  Patient: Cynthia Landry FGH:[id number] DOB: 06-13-1956   Code Status: DNR Admit date: 11/16/2022 Discharge date: 11/22/2022 Disposition: Assisted living, PT, OT, nurse aid, and RN PCP: [facility]  Recommendations for Outpatient Follow-up:  Follow up with PCP within 1-2 weeks Regarding general hospital follow up and preventative care Recommend follow up metabolic panel and CBC   Discharge Diagnoses:  Principal Problem:   Complicated UTI (urinary tract infection) Active Problems:   ALS (amyotrophic lateral sclerosis) ([place])   Depression   Hypothyroidism   Hypertension, benign   Diabetes mellitus without complication ([place])   COPD (chronic obstructive pulmonary disease) ([place])   Chronic heart failure with preserved ejection fraction (HFpEF) ([place])   Hypokalemia   Hyperlipidemia   Hypophosphatemia   Abnormal TSH   Multiple sclerosis ([place])   Altered mental status  Brief Hospital Course Summary: Cynthia Landry is a 67 y.o. female with a PMH significant for ALS on bipap qhs, chronic O2 , anxiety/depression, CHF, DMII, HLD, HTN, Thyroid disease, Ataxia,vitamin D def .   They presented from ALF to the ED on 11/16/2022 with lethargy x 2 days.   In the ED, it was found that they were febrile with Tmax 100.5, BP 94/52-84/55 (107/58),  HR 90, RR 16, O2 sat 99% on 4L. Labs notable for K 2.5 and urinalysis was consistent with infection. UxCx collected.   They were initially treated with IV rocephin.   1/2- urine cultures resulted showing ESBL with resistance to rocephin. Transitioned to meropenem.  1/2-1/5- patient remained stable on meropenem with vital signs within normal range and resolved urinary symptoms. She was transitioned from meropenem to nitrofurantoin prior to discharge.  1/5- patient states that she wants to go "home home" which her daughter interpreted to possibly mean heaven. She is at her baseline mental status and physical  ability. Chaplain was consulted to talk with her. Recommend continued hospice discussions with her at her facility.   All other chronic conditions were treated with home medications.   Discharge Condition: Good, improved Recommended discharge diet: Regular healthy diet  Consultations: None   Procedures/Studies: None   Allergies as of 11/22/2022       Reactions   Aspirin Other (See Comments)   dizziness   Hctz [hydrochlorothiazide] Other (See Comments)   cramping   Lisinopril Cough        Medication List     STOP taking these medications    atorvastatin 20 MG tablet Commonly known as: LIPITOR   bisacodyl 10 MG suppository Commonly known as: DULCOLAX   insulin lispro 100 UNIT/ML injection Commonly known as: HUMALOG       TAKE these medications    acetaminophen 650 MG suppository Commonly known as: TYLENOL Place 650 mg rectally every 4 (four) hours as needed for fever.   acetaminophen 325 MG tablet Commonly known as: TYLENOL Take 650 mg by mouth every 4 (four) hours as needed.   Baclofen 5 MG Tabs Take 1 tablet by mouth 3 (three) times daily. 0900/1400/2100   carvedilol 12.5 MG tablet Commonly known as: COREG Take 1 tablet (12.5 mg total) by mouth 2 (two) times daily.   divalproex 125 MG capsule Commonly known as: DEPAKOTE SPRINKLE Take 250 mg by mouth daily.   DULoxetine 30 MG capsule Commonly known as: CYMBALTA Take 60 mg by mouth every morning.   furosemide 40 MG tablet Commonly known as: LASIX Take 40 mg by mouth daily. And additional 40mg  QPM PRN for edema  or shortness of breath   gabapentin 300 MG capsule Commonly known as: NEURONTIN Take 300 mg by mouth at bedtime.   insulin glargine 100 UNIT/ML injection Commonly known as: LANTUS Inject 10 Units into the skin at bedtime.   latanoprost 0.005 % ophthalmic solution Commonly known as: XALATAN Place 1 drop into both eyes at bedtime.   levothyroxine 125 MCG tablet Commonly known as:  SYNTHROID Take 1 tablet (125 mcg total) by mouth daily before breakfast. Start taking on: November 23, 2022 What changed:  medication strength how much to take when to take this   melatonin 5 MG Tabs Take 10 mg by mouth at bedtime.   metFORMIN 500 MG tablet Commonly known as: GLUCOPHAGE Take 500 mg by mouth daily.   nitrofurantoin (macrocrystal-monohydrate) 100 MG capsule Commonly known as: Macrobid Take 1 capsule (100 mg total) by mouth 2 (two) times daily for 7 days. What changed: when to take this   nitroGLYCERIN 0.4 MG SL tablet Commonly known as: NITROSTAT Place 0.4 mg under the tongue every 5 (five) minutes as needed for chest pain.   polyethylene glycol powder 17 GM/SCOOP powder Commonly known as: GLYCOLAX/MIRALAX Take 17 g by mouth daily.   sacubitril-valsartan 24-26 MG Commonly known as: ENTRESTO Take 1 tablet by mouth 2 (two) times daily.   senna 8.6 MG Tabs tablet Commonly known as: SENOKOT Take 2 tablets by mouth 2 (two) times daily.   sertraline 25 MG tablet Commonly known as: ZOLOFT Take 25 mg by mouth daily.   traMADol 50 MG tablet Commonly known as: ULTRAM Take 50 mg by mouth every 12 (twelve) hours as needed.   vitamin B-12 500 MCG tablet Commonly known as: CYANOCOBALAMIN Take 1 tablet by mouth daily with breakfast.   Zinc Oxide 10 % Oint Apply 1 Application topically in the morning, at noon, and at bedtime. To groin area       Subjective   Pt reports no complaints. She feels well overall. Denies urinary symptoms. Denies pain. Denies SOB or CP. Denies leg swelling.  She states that she is ready to go home to the ALF. She also says that she wants to go "home home".  All questions and concerns were addressed at time of discharge.  Objective  Blood pressure (!) 142/63, pulse 71, temperature 98.3 F (36.8 C), resp. rate 18, height 5\' 5"  (1.651 m), weight 84.9 kg, SpO2 100 %.   General: Pt is alert, awake, not in acute distress Cardiovascular:  RRR, S1/S2 +, no rubs, no gallops Respiratory: CTA bilaterally, no wheezing, no rhonchi Abdominal: Soft, NT, ND, bowel sounds + Extremities: no edema, no cyanosis  The results of significant diagnostics from this hospitalization (including imaging, microbiology, ancillary and laboratory) are listed below for reference.   Imaging studies: CT Chest Wo Contrast  Result Date: 11/16/2022 CLINICAL DATA:  Left lower lobe infiltrate seen in chest radiographs EXAM: CT CHEST WITHOUT CONTRAST TECHNIQUE: Multidetector CT imaging of the chest was performed following the standard protocol without IV contrast. RADIATION DOSE REDUCTION: This exam was performed according to the departmental dose-optimization program which includes automated exposure control, adjustment of the mA and/or kV according to patient size and/or use of iterative reconstruction technique. COMPARISON:  CT done on 06/17/2022, chest radiograph done today FINDINGS: Cardiovascular: Scattered coronary artery calcifications are seen. Scattered calcifications are seen in thoracic aorta. There is ectasia of main pulmonary artery measuring 3.5 cm suggesting possible pulmonary arterial hypertension. Mediastinum/Nodes: No significant lymphadenopathy is seen. Lungs/Pleura: Left hemidiaphragm is elevated.  There is patchy infiltrate in posterior aspects of both lower lung fields, more so on the left side. This finding has not changed significantly since 06/17/2022. In image 58 of series 4, 3 mm nodule is seen in left upper lobe which appears stable. Small linear density in the medial right upper lung fields has not changed. There is no pleural effusion or pneumothorax. Upper Abdomen: There is high density in the lumen of gallbladder suggesting presence of sludge and stones. Small hiatal hernia is seen. Musculoskeletal: No acute findings are seen. IMPRESSION: There are linear patchy infiltrates in both lower lung fields, more so in left lower lung fields  suggesting atelectasis. Similar finding was seen in previous CT done on 06/17/2022. No new focal infiltrates are seen. There is no significant pleural effusion or pneumothorax. Coronary artery disease. Thoracic aortic atherosclerosis. There is ectasia of main pulmonary artery suggesting pulmonary arterial hypertension. Gallbladder stones.  Small hiatal hernia. Electronically Signed   By: Elmer Picker M.D.   On: 11/16/2022 19:57   CT Head Wo Contrast  Result Date: 11/16/2022 CLINICAL DATA:  Altered mental status EXAM: CT HEAD WITHOUT CONTRAST TECHNIQUE: Contiguous axial images were obtained from the base of the skull through the vertex without intravenous contrast. RADIATION DOSE REDUCTION: This exam was performed according to the departmental dose-optimization program which includes automated exposure control, adjustment of the mA and/or kV according to patient size and/or use of iterative reconstruction technique. COMPARISON:  CT done on 06/10/2022, MR brain done on 06/10/2022 FINDINGS: Brain: No acute intracranial findings are seen. There are no signs of bleeding within the cranium. There is dilation of third and both lateral ventricles out of proportion to cortical atrophy. Fourth ventricle is unremarkable. There is decreased density in periventricular and subcortical white matter in both cerebral hemispheres. Vascular: Unremarkable. Skull: Unremarkable. Sinuses/Orbits: Unremarkable. Other: No significant interval changes are noted. IMPRESSION: No acute intracranial findings are seen. 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 No significant interval changes are noted since 06/10/2022. Electronically Signed   By: Elmer Picker M.D.   On: 11/16/2022 19:41   DG Chest 1 View  Result Date: 11/16/2022 CLINICAL DATA:  Altered mental status.  Failure to thrive. EXAM: CHEST  1 VIEW COMPARISON:   One-view chest x-ray 06/16/2022 FINDINGS: Heart is mildly enlarged. Atherosclerotic changes are present at the aortic arch. Lung volumes are low. Left lower lobe opacity noted. Mild pulmonary vascular congestion is present. No other focal airspace disease is present. Postoperative changes again noted in the proximal left humerus. IMPRESSION: 1. Low lung volumes. 2. Left lower lobe opacity likely reflects partial collapse of the lower lobe. Airspace disease is considered less likely. Electronically Signed   By: San Morelle M.D.   On: 11/16/2022 18:04    Labs: Basic Metabolic Panel: Recent Labs  Lab 11/16/22 1711 11/17/22 0456 11/17/22 1430 11/18/22 0342 11/19/22 0548 11/20/22 0545 11/21/22 0418  NA 138 137  --  137 140 142 140  K 2.5* 5.3* 3.0* 3.1* 3.8 4.3 3.6  CL 95* 101  --  99 100 104 102  CO2 32 27  --  30 32 29 30  GLUCOSE 146* 118*  --  88 103* 131* 118*  BUN 25* 25*  --  14 8 <5* <5*  CREATININE 0.64 0.40*  --  <0.30* <0.30* <0.30* <0.30*  CALCIUM 9.2 8.5*  --  8.8* 8.7* 8.8* 8.7*  MG 2.1  --   --  1.8 1.9  --   --   PHOS  --   --   --  2.1* 3.4  --   --    CBC: Recent Labs  Lab 11/16/22 1711 11/17/22 0456 11/18/22 0342 11/19/22 0548 11/20/22 0545  WBC 27.8* 26.8* 23.0* 16.8* 17.0*  NEUTROABS 20.9*  --   --   --   --   HGB 11.9* 10.4* 10.4* 10.1* 10.8*  HCT 37.8 33.8* 33.6* 32.9* 35.8*  MCV 92.6 94.7 93.6 95.1 96.0  PLT 256 210 256 271 311   Microbiology: Results for orders placed or performed during the hospital encounter of 11/16/22  Urine Culture     Status: Abnormal   Collection Time: 11/16/22  6:05 PM   Specimen: Urine, Clean Catch  Result Value Ref Range Status   Specimen Description   Final    URINE, CLEAN CATCH Performed at [facility], [address] [place], [place] [address]    Special Requests   Final    NONE Performed at [facility], [address] [place], [place] [address]    Culture (A)  Final    >=100,000  COLONIES/mL ESCHERICHIA COLI Confirmed Extended Spectrum Beta-Lactamase Producer (ESBL).  In bloodstream infections from ESBL organisms, carbapenems are preferred over piperacillin/tazobactam. They are shown to have a lower risk of mortality.    Report Status 11/19/2022 FINAL  Final   Organism ID, Bacteria ESCHERICHIA COLI (A)  Final      Susceptibility   Escherichia coli - MIC*    AMPICILLIN >=32 RESISTANT Resistant     CEFAZOLIN >=64 RESISTANT Resistant     CEFEPIME 16 RESISTANT Resistant     CEFTRIAXONE >=64 RESISTANT Resistant     CIPROFLOXACIN >=4 RESISTANT Resistant     GENTAMICIN <=1 SENSITIVE Sensitive     IMIPENEM <=0.25 SENSITIVE Sensitive     NITROFURANTOIN <=16 SENSITIVE Sensitive     TRIMETH/SULFA >=320 RESISTANT Resistant     AMPICILLIN/SULBACTAM >=32 RESISTANT Resistant     PIP/TAZO <=4 SENSITIVE Sensitive     * >=100,000 COLONIES/mL ESCHERICHIA COLI  Resp panel by RT-PCR (RSV, Flu A&B, Covid) Anterior Nasal Swab     Status: None   Collection Time: 11/16/22  8:41 PM   Specimen: Anterior Nasal Swab  Result Value Ref Range Status   SARS Coronavirus 2 by RT PCR NEGATIVE NEGATIVE Final    Comment: (NOTE) SARS-CoV-2 target nucleic acids are NOT DETECTED.  The SARS-CoV-2 RNA is generally detectable in upper respiratory specimens during the acute phase of infection. The lowest concentration of SARS-CoV-2 viral copies this assay can detect is 138 copies/mL. A negative result does not preclude SARS-Cov-2 infection and should not be used as the sole basis for treatment or other patient management decisions. A negative result may occur with  improper specimen collection/handling, submission of specimen other than nasopharyngeal swab, presence of viral mutation(s) within the areas targeted by this assay, and inadequate number of viral copies(<138 copies/mL). A negative result must be combined with clinical observations, patient history, and epidemiological information. The  expected result is Negative.  Fact Sheet for Patients:  BloggerCourse.com  Fact Sheet for Healthcare Providers:  SeriousBroker.it  This test is no t yet approved or cleared by the [place] FDA and  has been authorized for detection and/or diagnosis of SARS-CoV-2 by FDA under an Emergency Use Authorization (EUA). This EUA will remain  in effect (meaning this test can be used) for the duration of the COVID-19 declaration under Section 564(b)(1)  of the Act, 21 U.S.C.section 360bbb-3(b)(1), unless the authorization is terminated  or revoked sooner.       Influenza A by PCR NEGATIVE NEGATIVE Final   Influenza B by PCR NEGATIVE NEGATIVE Final    Comment: (NOTE) The Xpert Xpress SARS-CoV-2/FLU/RSV plus assay is intended as an aid in the diagnosis of influenza from Nasopharyngeal swab specimens and should not be used as a sole basis for treatment. Nasal washings and aspirates are unacceptable for Xpert Xpress SARS-CoV-2/FLU/RSV testing.  Fact Sheet for Patients: BloggerCourse.com  Fact Sheet for Healthcare Providers: SeriousBroker.it  This test is not yet approved or cleared by the [place] FDA and has been authorized for detection and/or diagnosis of SARS-CoV-2 by FDA under an Emergency Use Authorization (EUA). This EUA will remain in effect (meaning this test can be used) for the duration of the COVID-19 declaration under Section 564(b)(1) of the Act, 21 U.S.C. section 360bbb-3(b)(1), unless the authorization is terminated or revoked.     Resp Syncytial Virus by PCR NEGATIVE NEGATIVE Final    Comment: (NOTE) Fact Sheet for Patients: BloggerCourse.com  Fact Sheet for Healthcare Providers: SeriousBroker.it  This test is not yet approved or cleared by the [place] FDA and has been authorized for detection and/or  diagnosis of SARS-CoV-2 by FDA under an Emergency Use Authorization (EUA). This EUA will remain in effect (meaning this test can be used) for the duration of the COVID-19 declaration under Section 564(b)(1) of the Act, 21 U.S.C. section 360bbb-3(b)(1), unless the authorization is terminated or revoked.  Performed at [facility], [address], [place], [place] [address]    Time coordinating discharge: Over 30 minutes  Leeroy Bock, MD  [facility] 11/22/2022, 10:37 AM

## 2022-11-22 NOTE — Progress Notes (Signed)
   11/22/22 1600  Spiritual Encounters  Type of Visit Initial  Care provided to: Patient  Referral source Nurse (RN/NT/LPN)  Reason for visit Routine spiritual support  OnCall Visit Yes   Chaplain responded to nurse consult. Chaplain provided compassionate presence and reflective listening as patient spoke about health challenges. Chaplain services are available as needed.

## 2022-11-22 NOTE — TOC Transition Note (Signed)
Transition of Care [facility]) - CM/SW Discharge Note   Patient Details  Name: Cynthia Landry MRN: [id number] Date of Birth: August 14, 1956  Transition of Care [facility]) CM/SW Contact:  Beverly Sessions, RN Phone Number: 11/22/2022, 11:16 AM   Clinical Narrative:      Patient will DC to: [facility]  Anticipated DC date:11/22/22  Family notified: Bedside RN to notify daughter Transport OA:CZYSA Lorayne Bender with [employer] notified  Bedside RN confirmed EMS packet and DNR on chart   Per MD patient ready for DC to . RN, patient's family, and facility notified of DC. Discharge Summary sent to facility. RN given number for report. DC packet on chart. Ambulance transport requested for patient.  TOC signing off.  Isaias Cowman [facility] [phone]        Patient Goals and CMS Choice      Discharge Placement                         Discharge Plan and Services Additional resources added to the After Visit Summary for                                       Social Determinants of Health (SDOH) Interventions SDOH Screenings   Food Insecurity: No Food Insecurity (11/18/2022)  Housing: Low Risk  (11/18/2022)  Transportation Needs: No Transportation Needs (11/18/2022)  Utilities: Not At Risk (11/18/2022)  Depression (PHQ2-9): Low Risk  (09/07/2021)  Tobacco Use: Low Risk  (11/19/2022)     Readmission Risk Interventions    11/19/2022    3:19 PM  Readmission Risk Prevention Plan  Transportation Screening Complete  HRI or [place] Complete  Social Work Consult for [place] Planning/Counseling Complete  Palliative Care Screening Complete  Medication Review [employer] photographer) Complete

## 2022-11-22 NOTE — Plan of Care (Signed)

## 2022-11-22 NOTE — Progress Notes (Incomplete)
PROGRESS NOTE  Cynthia Landry    DOB: 1956/10/31, 67 y.o.  JSE:[id number]    Code Status: DNR   DOA: 11/16/2022   LOS: 6   Brief hospital course  Cynthia Landry is a 67 y.o. female with a PMH significant for ALS on bipap qhs, chronic O2 , anxiety/depression, CHF, DMII, HLD, HTN, Thyroid disease, Ataxia,vitamin D def .  They presented from ALF to the ED on 11/16/2022 with lethargy x 2 days.  In the ED, it was found that they had febrile with Tmax 100.5, BP 94/52-84/55 (107/58),  HR 90, RR 16, O2 sat 99% on 4L. Labs notable for K 2.5 and urinalysis was consistent with infection. UxCx collected.  They were initially treated with IV rocephin.  1/2- urine cultures resulted showing ESBL with resistance to rocephin. Transitioned to meropenem.   11/22/22 - Doing well with new treatment.   Assessment & Plan  Principal Problem:   Complicated UTI (urinary tract infection) Active Problems:   ALS (amyotrophic lateral sclerosis) (HCC)   Depression   Hypothyroidism   Hypertension, benign   Diabetes mellitus without complication (HCC)   COPD (chronic obstructive pulmonary disease) (HCC)   Chronic heart failure with preserved ejection fraction (HFpEF) (HCC)   Hypokalemia   Hyperlipidemia   Hypophosphatemia   Abnormal TSH   Multiple sclerosis (HCC)   Altered mental status  Complicated UTI- Severe Sepsis on admission as evidenced by fever, leukocytosis, intermittent tachycardia.  Mental status changes (lethargy) consistent with organ dysfunction and severe sepsis. Urine culture positive ESBL E coli Sepsis physiology resolved.  - meropenem (1/2-  - monitor UOP   Abnormal TSH- TSH check on admission was low, 0.233. T4 elevated.  - changed levothyroxine from 164mcg to 177mcg daily   Hypophosphatemia- resolved - monitor and replete PRN  Hyperlipidemia Continue statin   Hypokalemia- resolved s/p repletion K 2.5 on admission was aggressively replaced.  --BMP in AM   HFpEF- echo >55%  05/2022 Appears overall euvolemic. - Continue Coreg, Entresto   COPD (chronic obstructive pulmonary disease) (HCC) Not acutely exacerbated. - Continue PRN bronchodilators.   Diabetes mellitus without complication (HCC) Last W7P 9.5, poorly controlled. - Sliding scale Novolog and Semglee basal insulin.   Hypertension, benign With baseline soft BP's noted on admission. - Continue Coreg, Entresto (hold if MAP<65)   Hypothyroidism - Continue Synthroid   Depression Anxiety and Deperssion - Continue home Cymbalta, Zoloft, Depakote   ALS- Continue BiPAP overnight. Continue home Depakote, baclofen, gabapentin  Body mass index is 31.15 kg/m.  VTE ppx: enoxaparin (LOVENOX) injection 42.5 mg Start: 11/21/22 2200lovenox  Diet:     Diet   DIET DYS 2 Room service appropriate? Yes; Fluid consistency: Thin   Consultants: None   Subjective 11/22/22    Pt reports doing well. She denies pain or shortness of breath. She states she is thirsty and expresses desire to go home.    Objective   Vitals:   11/21/22 1627 11/21/22 1943 11/22/22 0406 11/22/22 0500  BP: (!) 120/57 120/60 114/61   Pulse: 71 72 70   Resp: 18 20 20    Temp: 98.2 F (36.8 C) 98.4 F (36.9 C) 98 F (36.7 C)   TempSrc:  Oral Oral   SpO2: 100% 100% 100%   Weight:    84.9 kg  Height:        Intake/Output Summary (Last 24 hours) at 11/22/2022 0715 Last data filed at 11/22/2022 7106 Gross per 24 hour  Intake 400 ml  Output 1750  ml  Net -1350 ml    Filed Weights   11/19/22 0500 11/20/22 0500 11/22/22 0500  Weight: 84.1 kg 83.7 kg 84.9 kg     Physical Exam:  General: awake, alert, NAD HEENT: atraumatic, clear conjunctiva, anicteric sclera, MMM, hearing grossly normal Respiratory: normal respiratory effort. Cardiovascular: quick capillary refill, normal S1/S2, RRR, no JVD, murmurs Gastrointestinal: soft, NT, ND Nervous: Alert, following commands Extremities: no edema, normal tone Skin: dry, intact,  normal temperature, normal color. No rashes, lesions or ulcers on exposed skin  Labs   I have personally reviewed the following labs and imaging studies CBC    Component Value Date/Time   WBC 17.0 (H) 11/20/2022 0545   RBC 3.73 (L) 11/20/2022 0545   HGB 10.8 (L) 11/20/2022 0545   HGB 14.2 01/12/2013 1713   HCT 35.8 (L) 11/20/2022 0545   HCT 43.0 01/12/2013 1713   PLT 311 11/20/2022 0545   PLT 363 01/12/2013 1713   MCV 96.0 11/20/2022 0545   MCV 84 01/12/2013 1713   MCH 29.0 11/20/2022 0545   MCHC 30.2 11/20/2022 0545   RDW 13.2 11/20/2022 0545   RDW 13.9 01/12/2013 1713   LYMPHSABS 2.8 11/16/2022 1711   MONOABS 3.4 (H) 11/16/2022 1711   EOSABS 0.1 11/16/2022 1711   BASOSABS 0.1 11/16/2022 1711      Latest Ref Rng & Units 11/21/2022    4:18 AM 11/20/2022    5:45 AM 11/19/2022    5:48 AM  BMP  Glucose 70 - 99 mg/dL 118  131  103   BUN 8 - 23 mg/dL <5  <5  8   Creatinine 0.44 - 1.00 mg/dL <0.30  <0.30  <0.30   Sodium 135 - 145 mmol/L 140  142  140   Potassium 3.5 - 5.1 mmol/L 3.6  4.3  3.8   Chloride 98 - 111 mmol/L 102  104  100   CO2 22 - 32 mmol/L 30  29  32   Calcium 8.9 - 10.3 mg/dL 8.7  8.8  8.7    Disposition Plan & Communication  Patient status: Inpatient  Admitted From: ALF Planned disposition location: Assisted living Anticipated discharge date: 1/5 pending completing IV Abx  Family Communication: none at bedside    Author: Richarda Osmond, DO [facility] 11/22/2022, 7:15 AM   Available by Epic secure chat 7AM-7PM. If 7PM-7AM, please contact night-coverage.  [facility] contact information found on [email or web address].

## 2023-03-19 DEATH — deceased
